# Patient Record
Sex: Female | Born: 1965
Health system: Southern US, Community
[De-identification: ages and names within clinical notes are randomized; demographics above are authoritative.]

## PROBLEM LIST (undated history)

## (undated) DIAGNOSIS — D509 Iron deficiency anemia, unspecified: Secondary | ICD-10-CM

## (undated) DIAGNOSIS — G510 Bell's palsy: Secondary | ICD-10-CM

## (undated) DIAGNOSIS — K219 Gastro-esophageal reflux disease without esophagitis: Secondary | ICD-10-CM

## (undated) DIAGNOSIS — K900 Celiac disease: Secondary | ICD-10-CM

## (undated) DIAGNOSIS — R42 Dizziness and giddiness: Secondary | ICD-10-CM

## (undated) DIAGNOSIS — I10 Essential (primary) hypertension: Secondary | ICD-10-CM

## (undated) DIAGNOSIS — L409 Psoriasis, unspecified: Secondary | ICD-10-CM

## (undated) DIAGNOSIS — D219 Benign neoplasm of connective and other soft tissue, unspecified: Secondary | ICD-10-CM

## (undated) DIAGNOSIS — Z8701 Personal history of pneumonia (recurrent): Secondary | ICD-10-CM

## (undated) DIAGNOSIS — D649 Anemia, unspecified: Secondary | ICD-10-CM

## (undated) DIAGNOSIS — H8109 Meniere's disease, unspecified ear: Secondary | ICD-10-CM

## (undated) DIAGNOSIS — N920 Excessive and frequent menstruation with regular cycle: Secondary | ICD-10-CM

## (undated) DIAGNOSIS — A64 Unspecified sexually transmitted disease: Secondary | ICD-10-CM

## (undated) HISTORY — DX: Iron deficiency anemia, unspecified: D50.9

## (undated) HISTORY — DX: Gastro-esophageal reflux disease without esophagitis: K21.9

## (undated) HISTORY — DX: Anemia, unspecified: D64.9

## (undated) HISTORY — DX: Benign neoplasm of connective and other soft tissue, unspecified: D21.9

## (undated) HISTORY — DX: Meniere's disease, unspecified ear: H81.09

## (undated) HISTORY — DX: Unspecified sexually transmitted disease: A64

## (undated) HISTORY — DX: Psoriasis, unspecified: L40.9

## (undated) HISTORY — PX: APPENDECTOMY: SHX54

## (undated) HISTORY — DX: Essential (primary) hypertension: I10

## (undated) HISTORY — DX: Excessive and frequent menstruation with regular cycle: N92.0

## (undated) HISTORY — DX: Bell's palsy: G51.0

---

## 2003-06-10 ENCOUNTER — Ambulatory Visit (HOSPITAL_COMMUNITY): Admission: RE | Admit: 2003-06-10 | Discharge: 2003-06-10 | Payer: Self-pay | Admitting: Internal Medicine

## 2004-01-19 ENCOUNTER — Ambulatory Visit (HOSPITAL_COMMUNITY): Payer: Self-pay | Admitting: Oncology

## 2004-02-29 ENCOUNTER — Encounter: Admission: RE | Admit: 2004-02-29 | Discharge: 2004-02-29 | Payer: Self-pay | Admitting: Oncology

## 2004-02-29 ENCOUNTER — Encounter (HOSPITAL_COMMUNITY): Admission: RE | Admit: 2004-02-29 | Discharge: 2004-03-30 | Payer: Self-pay | Admitting: Oncology

## 2004-05-10 ENCOUNTER — Encounter: Admission: RE | Admit: 2004-05-10 | Discharge: 2004-05-10 | Payer: Self-pay | Admitting: Oncology

## 2004-05-10 ENCOUNTER — Encounter (HOSPITAL_COMMUNITY): Admission: RE | Admit: 2004-05-10 | Discharge: 2004-06-09 | Payer: Self-pay | Admitting: Oncology

## 2004-05-10 ENCOUNTER — Ambulatory Visit (HOSPITAL_COMMUNITY): Payer: Self-pay | Admitting: Oncology

## 2004-07-05 ENCOUNTER — Encounter (HOSPITAL_COMMUNITY): Admission: RE | Admit: 2004-07-05 | Discharge: 2004-08-04 | Payer: Self-pay | Admitting: Oncology

## 2004-07-05 ENCOUNTER — Ambulatory Visit (HOSPITAL_COMMUNITY): Payer: Self-pay | Admitting: Oncology

## 2004-07-05 ENCOUNTER — Encounter: Admission: RE | Admit: 2004-07-05 | Discharge: 2004-07-05 | Payer: Self-pay | Admitting: Oncology

## 2004-09-12 ENCOUNTER — Ambulatory Visit (HOSPITAL_COMMUNITY): Payer: Self-pay | Admitting: Oncology

## 2004-09-12 ENCOUNTER — Encounter (HOSPITAL_COMMUNITY): Admission: RE | Admit: 2004-09-12 | Discharge: 2004-10-12 | Payer: Self-pay | Admitting: Oncology

## 2004-09-12 ENCOUNTER — Encounter: Admission: RE | Admit: 2004-09-12 | Discharge: 2004-09-12 | Payer: Self-pay | Admitting: Oncology

## 2004-12-07 ENCOUNTER — Ambulatory Visit (HOSPITAL_COMMUNITY): Admission: RE | Admit: 2004-12-07 | Discharge: 2004-12-07 | Payer: Self-pay | Admitting: Internal Medicine

## 2005-01-05 ENCOUNTER — Ambulatory Visit (HOSPITAL_COMMUNITY): Admission: RE | Admit: 2005-01-05 | Discharge: 2005-01-05 | Payer: Self-pay | Admitting: Internal Medicine

## 2006-03-22 ENCOUNTER — Ambulatory Visit (HOSPITAL_COMMUNITY): Admission: RE | Admit: 2006-03-22 | Discharge: 2006-03-22 | Payer: Self-pay | Admitting: Internal Medicine

## 2006-04-04 ENCOUNTER — Ambulatory Visit (HOSPITAL_COMMUNITY): Admission: RE | Admit: 2006-04-04 | Discharge: 2006-04-05 | Payer: Self-pay | Admitting: Cardiology

## 2006-05-15 ENCOUNTER — Other Ambulatory Visit: Admission: RE | Admit: 2006-05-15 | Discharge: 2006-05-15 | Payer: Self-pay | Admitting: Obstetrics and Gynecology

## 2006-11-06 ENCOUNTER — Encounter (HOSPITAL_COMMUNITY): Admission: RE | Admit: 2006-11-06 | Discharge: 2006-11-06 | Payer: Self-pay | Admitting: Oncology

## 2006-11-06 ENCOUNTER — Ambulatory Visit (HOSPITAL_COMMUNITY): Payer: Self-pay | Admitting: Oncology

## 2007-04-03 ENCOUNTER — Encounter (HOSPITAL_COMMUNITY): Admission: RE | Admit: 2007-04-03 | Discharge: 2007-05-03 | Payer: Self-pay | Admitting: Oncology

## 2007-04-03 ENCOUNTER — Ambulatory Visit (HOSPITAL_COMMUNITY): Payer: Self-pay | Admitting: Oncology

## 2007-09-04 ENCOUNTER — Ambulatory Visit (HOSPITAL_BASED_OUTPATIENT_CLINIC_OR_DEPARTMENT_OTHER): Admission: RE | Admit: 2007-09-04 | Discharge: 2007-09-04 | Payer: Self-pay | Admitting: Obstetrics and Gynecology

## 2007-09-05 ENCOUNTER — Other Ambulatory Visit: Admission: RE | Admit: 2007-09-05 | Discharge: 2007-09-05 | Payer: Self-pay | Admitting: Obstetrics and Gynecology

## 2008-10-07 ENCOUNTER — Ambulatory Visit (HOSPITAL_BASED_OUTPATIENT_CLINIC_OR_DEPARTMENT_OTHER): Admission: RE | Admit: 2008-10-07 | Discharge: 2008-10-07 | Payer: Self-pay | Admitting: Obstetrics and Gynecology

## 2008-10-07 ENCOUNTER — Ambulatory Visit: Payer: Self-pay | Admitting: Diagnostic Radiology

## 2008-10-08 ENCOUNTER — Other Ambulatory Visit: Admission: RE | Admit: 2008-10-08 | Discharge: 2008-10-08 | Payer: Self-pay | Admitting: Obstetrics and Gynecology

## 2009-09-27 ENCOUNTER — Emergency Department (HOSPITAL_BASED_OUTPATIENT_CLINIC_OR_DEPARTMENT_OTHER): Admission: EM | Admit: 2009-09-27 | Discharge: 2009-09-27 | Payer: Self-pay | Admitting: Emergency Medicine

## 2009-11-03 ENCOUNTER — Ambulatory Visit: Payer: Self-pay | Admitting: Diagnostic Radiology

## 2009-11-03 ENCOUNTER — Ambulatory Visit (HOSPITAL_BASED_OUTPATIENT_CLINIC_OR_DEPARTMENT_OTHER): Admission: RE | Admit: 2009-11-03 | Discharge: 2009-11-03 | Payer: Self-pay | Admitting: Internal Medicine

## 2009-12-29 ENCOUNTER — Emergency Department (HOSPITAL_COMMUNITY): Admission: EM | Admit: 2009-12-29 | Discharge: 2009-12-29 | Payer: Self-pay | Admitting: Emergency Medicine

## 2009-12-30 ENCOUNTER — Ambulatory Visit (HOSPITAL_BASED_OUTPATIENT_CLINIC_OR_DEPARTMENT_OTHER)
Admission: RE | Admit: 2009-12-30 | Discharge: 2009-12-30 | Payer: Self-pay | Source: Home / Self Care | Admitting: Emergency Medicine

## 2009-12-30 ENCOUNTER — Ambulatory Visit: Payer: Self-pay | Admitting: Interventional Radiology

## 2010-01-17 ENCOUNTER — Ambulatory Visit (HOSPITAL_COMMUNITY)
Admission: RE | Admit: 2010-01-17 | Discharge: 2010-01-17 | Payer: Self-pay | Source: Home / Self Care | Attending: Internal Medicine | Admitting: Internal Medicine

## 2010-02-26 ENCOUNTER — Encounter: Payer: Self-pay | Admitting: Internal Medicine

## 2010-02-27 ENCOUNTER — Encounter: Payer: Self-pay | Admitting: Internal Medicine

## 2010-06-24 NOTE — Discharge Summary (Signed)
[name], [name]              ACCOUNT NO.:  [id number]   MEDICAL RECORD NO.:  [id number]          PATIENT TYPE:  OIB   LOCATION:  2036                         FACILITY:  [facility]   PHYSICIAN:  Bryson Dames, M.D.DATE OF BIRTH:  [birth year]   DATE OF ADMISSION:  04/04/2006  DATE OF DISCHARGE:  04/05/2006                               DISCHARGE SUMMARY   DISCHARGE DIAGNOSES:  1. Prinzmetal angina-status post cath with noncritical coronary artery      disease affecting only right coronary artery, 30% __________      atherosclerotic plaques in proximal to mid portion.  Multiple areas      of intracoronary artery spasm.  2. Positive premature family history of coronary artery disease.  3. Labile hypertension.  4. History of tobacco abuse.   This is a 45 year old female patient of Dr. Melvern Banker who works as a Marine scientist  at [employer] who was admitted to [facility] with  complaints of chest pain for coronary angiography.  She was seen  initially at [facility] office by Dr. Mathis Bud approximately 4 years  ago.  At that time, she was having problems with high blood pressure.  Since then, patient was doing fine up until recently when she developed  chest pain and was scheduled for stress test in our office.  The stress  test was performed on the 29th of January of this year and revealed some  ischemia present in comparison to the previous study.  Based on this  result and her premature family history of coronary disease, patient was  scheduled for a diagnostic cath in [facility].  During that  catheterization, she was found to have 75% stenosis of the RCA and there  was fixed lesions and patient was scheduled for intervention at [facility].  She presented for intervention on April 04, 2006 and  Dr. Melvern Banker during that procedure performed harvest of the proximal mid  and right coronary artery, which did not reveal any fixed lesion, but  multiple areas  of intracoronary arteries present were infused  continuously with the catheter and reduction were registered.  Patient  only had 30% stenosis with a soft atherosclerotic heart block in the  proximal RCA and mid RCA.  Based on these findings, the decision was  made to proceed with medical therapy with calcium channel blockers and  episodic use of isosorbide mononitrate.  Avoid tobacco and start her own  statin therapy with her LDL greater than 120.   The next morning after catheterization, she was seen by Dr. Einar Gip.  Her  white blood cell count showed 4.9, hemoglobin 11.5, hematocrit 34.0,  platelet 226.  Cardiac enzymes x1 were negative.  BMP showed sodium 139,  potassium 3.6, glucose 112, BUN 9 and creatinine 0.64.  On discharge,  patient was seen by Dr. Einar Gip on the morning and felt to be stable for  discharge home.   DISCHARGE DIET:  Low-cholesterol, low-fat diet.   DISCHARGE ACTIVITIES:  She was instructed not to drive or lift weights  for 3 days post cath and may return to work on  April 09, 2006.  She is to  avoid bathing, but can shower using mild soap and not to rub puncture  site, pat it dry.  Increase activity slowly.   DISCHARGE MEDICATIONS:  1. Aspirin 325 mg daily.  2. Nexium 40 mg daily.  3. Calcium with vitamin D 600 mg daily.  4. Multivitamins daily.  5. Ambien 5 mg at night p.r.n.  6. Norvasc 5 mg daily.  7. Imdur 30 mg daily.  8. Zocor 20 mg daily.   DISCHARGE FOLLOWUP:  Patient will be seen by Dr. Mathis Bud in our office  and she would need to call and make that appointment.      York Grice, P.A.    ______________________________  Bryson Dames, M.D.    MK/MEDQ  D:  04/05/2006  T:  04/05/2006  Job:  [id number]   cc:   [facility]  Leslye Peer, MD

## 2010-06-24 NOTE — Cardiovascular Report (Signed)
[name], [name]              ACCOUNT NO.:  [id number]   MEDICAL RECORD NO.:  [id number]          PATIENT TYPE:  OIB   LOCATION:  2899                         FACILITY:  [facility]   PHYSICIAN:  Bryson Dames, M.D.DATE OF BIRTH:  1965/02/11   DATE OF PROCEDURE:  04/04/2006  DATE OF DISCHARGE:                            CARDIAC CATHETERIZATION   PROCEDURES PERFORMED:  1. Selective coronary angiography by Judkins technique.  2. Intravascular ultrasound of the proximal and mid right coronary      artery.  3. Intracoronary artery nitroglycerin and intracoronary verapamil      administration for intracoronary artery spasm.   PATIENT PROFILE:  [name] was catheterized at the [facility]  recently and had normal left anterior descending, left main and left  circumflex coronary arteries.  Her right coronary artery showed a area  of narrowing up to 75% in her proximal RCA in the upward bend of a mild  shepherd's crook configuration of the RCA.  I gave intracoronary  nitroglycerin in the cath lab there and showed no difference in the  luminal diameter and because of a recently positive stress test, we  surmise that the patient needed a stent.  We gave her Plavix and  arranged for this procedure to be done electively as an outpatient and  she was prepared with Plavix as an outpatient and then in the cath lab  was given intervenous Angiomax and ultimately begun on IV nitroglycerin.   Coronary angiography of the right coronary artery was then performed and  upon initial intubation of the right coronary artery, the catheter  aggressively deep-throated the ostium of the vessel and when I was able  to pull it back from the ostium of the vessel, I observed widespread  spasm of the proximal RCA and upgoing limb of the shepherd's crook and  also in the downgoing limb.  I gave her intracoronary nitroglycerin and  repeated the injection and there was partial relief of the spasm.  The  upgoing limb was improved, the downgoing limb was still spasmed.  I  continued to give intracoronary nitroglycerin and of course continued  the Angiomax which showed an ACT of 337 seconds.  I was able to show  virtual resolution of all but 30% area of mild narrowing.  At this  point, after reviewing the old films again, I decided to perform  intravascular ultrasound and to that end, we placed an Atlantis catheter  down the mid RCA and did two pullbacks to observe what, if any,  atherosclerotic plaque that we saw.  Over a segment of approximately 50  mm of RCA including proximal and right we saw one area of 30% eccentric  soft plaque at the takeoff of the acute marginal branch in the mid RCA  and we saw second area of 30% eccentric atherosclerotic plaquing near  the top of the shepherd's crook in her proximal RCA.  There were no  other areas of significant plaquing and the most plaquing we saw was 30%  as previously stated.   After the IVUS was performed in the wire was removed, we saw  a second  area of new spasm in the proximal portion of the distal RCA which  required both intracoronary nitroglycerin and 300 mcg of intracoronary  verapamil to treat back to normal vessel lumen diameter. TIMI-3 flow was  present throughout the vessel and no significant areas of narrowing were  noted at the conclusion of the case.  The patient tolerated procedure  fairly well.   This patient had been initially seen by [facility] and we had  planned for her to be in the PERSEUS trial, obviously that was aborted  once it was noted that the patient had only mild plaquing and a lot of  superimposed spasm in both proximal and mid RCA.   The patient was removed from the table.  Her Angiomax was stopped.  Her  sheath was left indwelling and that will be removed in the holding area.  She will be kept overnight for 23-hour observation to make sure that the  multiple areas of spasm have resolved.   FINAL  DIAGNOSES:  1. Multiple areas of intracoronary artery spasm spontaneous and      catheter induced.  2. Only 30% areas of soft atherosclerotic plaque proximal right      coronary artery and mid right coronary artery.   PLAN:  Medical therapy with calcium channel blockade, episodic use of  isosorbide mononitrate and continued abstinence from tobacco use.  The  patient will also be started on a Statin agent for her LDL of 120.           ______________________________  Bryson Dames, M.D.     WHG/MEDQ  D:  04/04/2006  T:  04/04/2006  Job:  [id number]   cc:   Lori Gilles. Gerarda Fraction, MD  Bryson Dames, M.D.  [facility]

## 2010-09-21 ENCOUNTER — Encounter (HOSPITAL_COMMUNITY): Payer: Self-pay | Admitting: Oncology

## 2010-09-21 ENCOUNTER — Encounter (HOSPITAL_COMMUNITY): Payer: 59 | Attending: Oncology | Admitting: Oncology

## 2010-09-21 VITALS — BP 178/93 | HR 59 | Temp 98.7°F | Ht 64.0 in | Wt 170.0 lb

## 2010-09-21 DIAGNOSIS — D509 Iron deficiency anemia, unspecified: Secondary | ICD-10-CM | POA: Insufficient documentation

## 2010-09-21 MED ORDER — SODIUM CHLORIDE 0.9 % IV SOLN: 1020.0000 mg | Freq: Once | INTRAVENOUS | Status: DC

## 2010-09-21 NOTE — Patient Instructions (Signed)
[facility]  Discharge Instructions  RECOMMENDATIONS MADE BY THE CONSULTANT AND ANY TEST RESULTS WILL BE SENT TO YOUR REFERRING DOCTOR.   EXAM FINDINGS BY MD TODAY AND SIGNS AND SYMPTOMS TO REPORT TO CLINIC OR PRIMARY MD:   Please do CBC/ferritin in early October and early February and send results to [phone] (fax). Return to Dr. Mariel Sleet in 1 year.   I acknowledge that I have been informed and understand all the instructions given to me and received a copy. I do not have any more questions at this time, but understand that I may call the Specialty Clinic at [facility] at [phone] during business hours should I have any further questions or need assistance in obtaining follow-up care.    __________________________________________  _____________  __________ Signature of Patient or Authorized Representative            Date                   Time    __________________________________________ Nurse's Signature

## 2010-09-21 NOTE — Progress Notes (Signed)
This office note has been dictated.

## 2010-09-22 NOTE — Progress Notes (Signed)
DIAGNOSES: 1. Iron deficiency. 2. History of iron deficiency anemia. 3. Hypercholesterolemia on therapy. 4. Excessive weight with a body mass index of 29.18.  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  She has been to see me in the past, but it has been about 4 years since she has been here.  She has had iron deficiency anemia in the past, and that was corrected only after IV iron was administered. She was never successful in absorbing iron after several different drugs.  She started craving ice about 3 or 4 months ago, and that has usually been her wake-up call for iron-deficiency again.  She had some blood work done at Dr. Sharyon Medicus office within the last week or two, but we do not have those results available at this time.  She also started feeling weak and tired a couple of months ago, a little lethargy.  She is not aware of depression that is significant by any means.  She did lose her father within the last couple of months from CHF, but that was expected, and he had been sick for about a year.  She had been helping nurse him at his [place], [place], home along with her sister and brother.  Her mother died of cancer years ago.  Her father was in his 93s.  She has one sister and one brother still alive and in good health.  Her brother has a history of testicular cancer status post surgical resection with no chemotherapy and also has a history of an MI at a very early age.  Because of that history, she was placed on cholesterol medication.  Other than that, she is not married.  She has no significant other at this time.  She is not a smoker.  She is working full time as an Charity fundraiser.  She is not aware of any nail changes, and no bowel changes.  She has even checked three Hemoccult cards which were negative for blood.  She has never had colonoscopy, of course, but  she has not turned 49 yet, and again she has not seen blood in her stool.  She has only had an occasional menses in the last 3-5 years.  She is on a type of birth control pill called Micronor by her gynecologist.  She has her GYN and breast exams by her gynecologist.  REVIEW OF SYSTEMS:  Otherwise, she is not aware of any trouble swallowing.  She does have a little bit of sensitive teeth typically to ice, but when she becomes iron deficient that seems to be overlooked and disappears, she craves the ice so much.  She has had no surgical procedures as a reason for losing blood, etc. She has not had fevers, chills or night sweats.  PHYSICAL EXAMINATION:  General Appearance:  On exam, she is in no acute distress.  She is alert.  She is oriented.  HEENT:  Facial symmetry is intact.  Nails are unremarkable.  Color of her nails is normal.  Lungs are clear to auscultation and percussion.  Heart:  Shows a regular rhythm and rate without murmur, rub or gallop.  Abdomen:  Bowel sounds are diminished.  She has no obvious hepatosplenomegaly.  She is certainly overweight for her height at this time.  She is 5 feet 4 inches.  Weight is 170 pounds.  Lymph nodes are negative throughout including the cervical, supraclavicular,  infraclavicular, axillary and inguinal area.  She has no obvious thyromegaly.  She has no peripheral edema.  Skin exam was unremarkable.  ASSESSMENT AND PLAN:  [name] most likely needs IV iron once again.  We will schedule her for Friday morning since she has it off.  We will give her Feraheme at this time 1020 mg IV for one dose and then in October and February we will get a CBC and ferritin.  She can have those drawn locally at her convenience, and then she will call me after we get the results and go over things and otherwise tentatively see her in a year.    ______________________________ Ladona Horns. Mariel Sleet, MD ESN/MEDQ  D:  09/21/2010  T:  09/22/2010  Job:  [id number]

## 2010-09-23 ENCOUNTER — Encounter (HOSPITAL_BASED_OUTPATIENT_CLINIC_OR_DEPARTMENT_OTHER): Payer: 59

## 2010-09-23 VITALS — BP 133/79 | HR 68 | Temp 98.7°F

## 2010-09-23 DIAGNOSIS — D509 Iron deficiency anemia, unspecified: Secondary | ICD-10-CM | POA: Insufficient documentation

## 2010-09-23 LAB — CBC
Hemoglobin: 13.2 g/dL (ref 12.0–15.0)
MCH: 26.7 pg (ref 26.0–34.0)
MCV: 82.4 fL (ref 78.0–100.0)
RBC: 4.94 MIL/uL (ref 3.87–5.11)
WBC: 4.6 10*3/uL (ref 4.0–10.5)

## 2010-09-23 MED ORDER — SODIUM CHLORIDE 0.9 % IJ SOLN
INTRAMUSCULAR | Status: AC
  Administered 2010-09-23: 10 mL
  Filled 2010-09-23: qty 10

## 2010-09-23 MED ORDER — SODIUM CHLORIDE 0.9 % IV SOLN
INTRAVENOUS | Status: DC
  Administered 2010-09-23: 13:00:00 via INTRAVENOUS

## 2010-09-23 MED ORDER — SODIUM CHLORIDE 0.9 % IV SOLN
1020.0000 mg | Freq: Once | INTRAVENOUS | Status: AC
  Administered 2010-09-23: 1020 mg via INTRAVENOUS
  Filled 2010-09-23: qty 34

## 2010-10-28 LAB — CBC
HCT: 41.1
MCHC: 34.2

## 2010-11-17 LAB — FERRITIN: Ferritin: 6 — ABNORMAL LOW (ref 10–291)

## 2010-11-17 LAB — CBC
HCT: 39.1
Hemoglobin: 12.7
MCHC: 32.6
MCV: 77.9 — ABNORMAL LOW
Platelets: 271
RBC: 5.01
RDW: 14.6 — ABNORMAL HIGH
WBC: 6.1

## 2010-12-08 ENCOUNTER — Telehealth (HOSPITAL_COMMUNITY): Payer: Self-pay | Admitting: *Deleted

## 2010-12-08 NOTE — Telephone Encounter (Signed)
Pt notified to repeat labs in 4-5 months--(cbc and ferritin).

## 2010-12-19 ENCOUNTER — Encounter: Payer: Self-pay | Admitting: Oncology

## 2011-08-01 ENCOUNTER — Other Ambulatory Visit (HOSPITAL_COMMUNITY): Payer: Self-pay | Admitting: Internal Medicine

## 2011-08-01 DIAGNOSIS — Z01419 Encounter for gynecological examination (general) (routine) without abnormal findings: Secondary | ICD-10-CM

## 2011-08-01 DIAGNOSIS — Z139 Encounter for screening, unspecified: Secondary | ICD-10-CM

## 2011-08-07 ENCOUNTER — Ambulatory Visit (HOSPITAL_COMMUNITY): Payer: 59

## 2011-09-20 ENCOUNTER — Ambulatory Visit (HOSPITAL_COMMUNITY): Payer: Commercial Managed Care - PPO | Admitting: Oncology

## 2011-09-20 ENCOUNTER — Ambulatory Visit (HOSPITAL_COMMUNITY): Payer: Self-pay | Admitting: Oncology

## 2011-10-25 ENCOUNTER — Ambulatory Visit (HOSPITAL_BASED_OUTPATIENT_CLINIC_OR_DEPARTMENT_OTHER)
Admission: RE | Admit: 2011-10-25 | Discharge: 2011-10-25 | Disposition: A | Discharge status: Home / Self Care | Payer: 59 | Source: Ambulatory Visit | Attending: Internal Medicine | Admitting: Internal Medicine

## 2011-10-25 ENCOUNTER — Other Ambulatory Visit (HOSPITAL_BASED_OUTPATIENT_CLINIC_OR_DEPARTMENT_OTHER): Payer: Self-pay | Admitting: Internal Medicine

## 2011-10-25 DIAGNOSIS — Z139 Encounter for screening, unspecified: Secondary | ICD-10-CM

## 2011-10-25 DIAGNOSIS — Z1231 Encounter for screening mammogram for malignant neoplasm of breast: Secondary | ICD-10-CM | POA: Insufficient documentation

## 2011-10-26 ENCOUNTER — Inpatient Hospital Stay (HOSPITAL_BASED_OUTPATIENT_CLINIC_OR_DEPARTMENT_OTHER): Admission: RE | Admit: 2011-10-26 | Payer: 59 | Source: Ambulatory Visit

## 2012-07-17 ENCOUNTER — Other Ambulatory Visit: Payer: Self-pay | Admitting: Certified Nurse Midwife

## 2012-07-17 NOTE — Telephone Encounter (Signed)
eScribe request for refill on MICRONOR Last filled - 06/15/11 X 1 YEAR Last AEX - 06/15/11 Next AEX - 08/22/12 1 pack sent to pharmacy.

## 2012-08-17 ENCOUNTER — Ambulatory Visit (INDEPENDENT_AMBULATORY_CARE_PROVIDER_SITE_OTHER): Payer: 59 | Admitting: Family Medicine

## 2012-08-17 VITALS — BP 135/84 | HR 85 | Temp 98.8°F | Resp 18 | Wt 168.0 lb

## 2012-08-17 DIAGNOSIS — J329 Chronic sinusitis, unspecified: Secondary | ICD-10-CM

## 2012-08-17 MED ORDER — FLUCONAZOLE 150 MG PO TABS: 150.0000 mg | ORAL_TABLET | Freq: Once | ORAL | Status: DC

## 2012-08-17 MED ORDER — AMOXICILLIN-POT CLAVULANATE 875-125 MG PO TABS: 1.0000 | ORAL_TABLET | Freq: Two times a day (BID) | ORAL | Status: DC

## 2012-08-17 NOTE — Progress Notes (Signed)
Patient ID: MEKIA DIPINTO MRN: [id number], DOB: April 27, 1965, 47 y.o. Date of Encounter: 08/17/2012, 1:55 PM  Primary Physician: No primary provider on file.  Chief Complaint:  Chief Complaint  Patient presents with  . URI  . Cough  . Sore Throat    HPI: 47 y.o. year old female presents with 8 day history of nasal congestion, post nasal drip, sore throat, sinus pressure, and cough. Afebrile. No chills. Nasal congestion thick and green/yellow. Sinus pressure is the worst symptom. Cough is productive secondary to post nasal drip and not associated with time of day. Ears feel full, leading to sensation of muffled hearing. Has tried OTC cold preps without success. No GI complaints.   No recent antibiotics, recent travels, or sick contacts  Quit smoking 10 years ago after mother developed lung cancer  No leg trauma, sedentary periods, h/o cancer Past Medical History  Diagnosis Date  . Hypertension     not taking meds yet  . GERD (gastroesophageal reflux disease)   . Anemia     iron def anemia  . Iron deficiency anemia      Home Meds: Prior to Admission medications   Medication Sig Start Date End Date Taking? Authorizing Provider  NORA-BE 0.35 MG tablet TAKE 1 TABLET BY MOUTH ONCE DAILY 07/17/12  Yes Verner Chol, CNM  Omega-3 Fatty Acids (FISH OIL) 1000 MG CAPS Take 1 capsule by mouth daily.     Yes Historical Provider, MD  pantoprazole (PROTONIX) 40 MG tablet Take 40 mg by mouth daily.   Yes Historical Provider, MD  zolpidem (AMBIEN) 10 MG tablet Take 10 mg by mouth at bedtime as needed.     Yes Historical Provider, MD  Calcium Carbonate-Vitamin D (CALCIUM + D) 600-200 MG-UNIT TABS Take 1 tablet by mouth 2 (two) times daily.      Historical Provider, MD  Coenzyme Q10 (CO Q 10) 10 MG CAPS Take 1 capsule by mouth daily.      Historical Provider, MD    Allergies:  Allergies  Allergen Reactions  . Erythromycin Other (See Comments)    Sensitivity/stomach upset    History    Social History  . Marital Status: Single    Spouse Name: N/A    Number of Children: N/A  . Years of Education: N/A   Occupational History  . Not on file.   Social History Main Topics  . Smoking status: Former Games developer  . Smokeless tobacco: Not on file  . Alcohol Use: 1.2 oz/week    1 Glasses of wine, 1 Cans of beer per week  . Drug Use: No  . Sexually Active: Yes    Birth Control/ Protection: Pill, Condom   Other Topics Concern  . Not on file   Social History Narrative  . No narrative on file     Review of Systems: Constitutional: negative for chills, fever, night sweats or weight changes Cardiovascular: negative for chest pain or palpitations Respiratory: negative for hemoptysis, wheezing, or shortness of breath Abdominal: negative for abdominal pain, nausea, vomiting or diarrhea Dermatological: negative for rash Neurologic: negative for headache   Physical Exam: Blood pressure 135/84, pulse 85, temperature 98.8 F (37.1 C), temperature source Oral, resp. rate 18, weight 168 lb (76.204 kg)., Body mass index is 28.82 kg/(m^2). General: Well developed, well nourished, in no acute distress. Head: Normocephalic, atraumatic, eyes without discharge, sclera non-icteric, nares are congested. Bilateral auditory canals clear, TM's are without perforation, pearly grey with reflective cone of light bilaterally.  Serous effusion bilaterally behind TM's. Maxillary sinus TTP. Oral cavity moist, dentition normal. Posterior pharynx with post nasal drip and mild erythema. No peritonsillar abscess or tonsillar exudate. Very friable, angry looking mucosa on the left nasal passage Neck: Supple. No thyromegaly. Full ROM. No lymphadenopathy. Lungs: Clear bilaterally to auscultation without wheezes, rales, or rhonchi. Breathing is unlabored.  Heart: RRR with S1 S2. No murmurs, rubs, or gallops appreciated. Msk:  Strength and tone normal for age. Extremities: No clubbing or cyanosis. No  edema. Neuro: Alert and oriented X 3. Moves all extremities spontaneously. CNII-XII grossly in tact. Psych:  Responds to questions appropriately with a normal affect.    ASSESSMENT AND PLAN:  47 y.o. year old female with sinusitis.  I am concerned with the left nasal passage and remote h/o smoking.  This needs to be rechecked next week and patient agrees to see PCP for this Sinusitis - Plan: amoxicillin-clavulanate (AUGMENTIN) 875-125 MG per tablet, fluconazole (DIFLUCAN) 150 MG tablet   -  -Tylenol/Motrin prn -Rest/fluids -RTC precautions -RTC 3-5 days if no improvement  Signed, Elvina Sidle, MD 08/17/2012 1:55 PM

## 2012-08-17 NOTE — Patient Instructions (Signed)
Have your primary care doctor recheck the left nostril to make sure it has cleared Let me know if your symptoms have not improved in 48 hours.

## 2012-08-20 ENCOUNTER — Encounter: Payer: Self-pay | Admitting: Certified Nurse Midwife

## 2012-08-22 ENCOUNTER — Ambulatory Visit (INDEPENDENT_AMBULATORY_CARE_PROVIDER_SITE_OTHER): Payer: 59 | Admitting: Certified Nurse Midwife

## 2012-08-22 ENCOUNTER — Encounter: Payer: Self-pay | Admitting: Certified Nurse Midwife

## 2012-08-22 VITALS — BP 120/70 | HR 64 | Resp 16 | Ht 64.0 in | Wt 169.0 lb

## 2012-08-22 DIAGNOSIS — Z01419 Encounter for gynecological examination (general) (routine) without abnormal findings: Secondary | ICD-10-CM

## 2012-08-22 DIAGNOSIS — Z0000 Encounter for general adult medical examination without abnormal findings: Secondary | ICD-10-CM

## 2012-08-22 MED ORDER — NORETHINDRONE 0.35 MG PO TABS: ORAL_TABLET | ORAL | Status: DC

## 2012-08-22 NOTE — Patient Instructions (Addendum)
General topics  Next pap or exam is  due in 1 year Take a Women's multivitamin Take 1200 mg. of calcium daily - prefer dietary If any concerns in interim to call back  Breast Self-Awareness Practicing breast self-awareness may pick up problems early, prevent significant medical complications, and possibly save your life. By practicing breast self-awareness, you can become familiar with how your breasts look and feel and if your breasts are changing. This allows you to notice changes early. It can also offer you some reassurance that your breast health is good. One way to learn what is normal for your breasts and whether your breasts are changing is to do a breast self-exam. If you find a lump or something that was not present in the past, it is best to contact your caregiver right away. Other findings that should be evaluated by your caregiver include nipple discharge, especially if it is bloody; skin changes or reddening; areas where the skin seems to be pulled in (retracted); or new lumps and bumps. Breast pain is seldom associated with cancer (malignancy), but should also be evaluated by a caregiver. BREAST SELF-EXAM The best time to examine your breasts is 5 7 days after your menstrual period is over.  ExitCare Patient Information 2013 [employer].   Exercise to Stay Healthy Exercise helps you become and stay healthy. EXERCISE IDEAS AND TIPS Choose exercises that:  You enjoy.  Fit into your day. You do not need to exercise really hard to be healthy. You can do exercises at a slow or medium level and stay healthy. You can:  Stretch before and after working out.  Try yoga, Pilates, or tai chi.  Lift weights.  Walk fast, swim, jog, run, climb stairs, bicycle, dance, or rollerskate.  Take aerobic classes. Exercises that burn about 150 calories:  Running 1  miles in 15 minutes.  Playing volleyball for 45 to 60 minutes.  Washing and waxing a car for 45 to 60  minutes.  Playing touch football for 45 minutes.  Walking 1  miles in 35 minutes.  Pushing a stroller 1  miles in 30 minutes.  Playing basketball for 30 minutes.  Raking leaves for 30 minutes.  Bicycling 5 miles in 30 minutes.  Walking 2 miles in 30 minutes.  Dancing for 30 minutes.  Shoveling snow for 15 minutes.  Swimming laps for 20 minutes.  Walking up stairs for 15 minutes.  Bicycling 4 miles in 15 minutes.  Gardening for 30 to 45 minutes.  Jumping rope for 15 minutes.  Washing windows or floors for 45 to 60 minutes. Document Released: 02/25/2010 Document Revised: 04/17/2011 Document Reviewed: 02/25/2010 ExitCare Patient Information 2013 [employer].   Other topics ( that may be useful information):    Sexually Transmitted Disease Sexually transmitted disease (STD) refers to any infection that is passed from person to person during sexual activity. This may happen by way of saliva, semen, blood, vaginal mucus, or urine. Common STDs include:  Gonorrhea.  Chlamydia.  Syphilis.  HIV/AIDS.  Genital herpes.  Hepatitis B and C.  Trichomonas.  Human papillomavirus (HPV).  Pubic lice. CAUSES  An STD may be spread by bacteria, virus, or parasite. A person can get an STD by:  Sexual intercourse with an infected person.  Sharing sex toys with an infected person.  Sharing needles with an infected person.  Having intimate contact with the genitals, mouth, or rectal areas of an infected person. SYMPTOMS  Some people may not have any symptoms, but   they can still pass the infection to others. Different STDs have different symptoms. Symptoms include:  Painful or bloody urination.  Pain in the pelvis, abdomen, vagina, anus, throat, or eyes.  Skin rash, itching, irritation, growths, or sores (lesions). These usually occur in the genital or anal area.  Abnormal vaginal discharge.  Penile discharge in men.  Soft, flesh-colored skin growths in the  genital or anal area.  Fever.  Pain or bleeding during sexual intercourse.  Swollen glands in the groin area.  Yellow skin and eyes (jaundice). This is seen with hepatitis. DIAGNOSIS  To make a diagnosis, your caregiver may:  Take a medical history.  Perform a physical exam.  Take a specimen (culture) to be examined.  Examine a sample of discharge under a microscope.  Perform blood test TREATMENT   Chlamydia, gonorrhea, trichomonas, and syphilis can be cured with antibiotic medicine.  Genital herpes, hepatitis, and HIV can be treated, but not cured, with prescribed medicines. The medicines will lessen the symptoms.  Genital warts from HPV can be treated with medicine or by freezing, burning (electrocautery), or surgery. Warts may come back.  HPV is a virus and cannot be cured with medicine or surgery.However, abnormal areas may be followed very closely by your caregiver and may be removed from the cervix, vagina, or vulva through office procedures or surgery. If your diagnosis is confirmed, your recent sexual partners need treatment. This is true even if they are symptom-free or have a negative culture or evaluation. They should not have sex until their caregiver says it is okay. HOME CARE INSTRUCTIONS  All sexual partners should be informed, tested, and treated for all STDs.  Take your antibiotics as directed. Finish them even if you start to feel better.  Only take over-the-counter or prescription medicines for pain, discomfort, or fever as directed by your caregiver.  Rest.  Eat a balanced diet and drink enough fluids to keep your urine clear or pale yellow.  Do not have sex until treatment is completed and you have followed up with your caregiver. STDs should be checked after treatment.  Keep all follow-up appointments, Pap tests, and blood tests as directed by your caregiver.  Only use latex condoms and water-soluble lubricants during sexual activity. Do not use  petroleum jelly or oils.  Avoid alcohol and illegal drugs.  Get vaccinated for HPV and hepatitis. If you have not received these vaccines in the past, talk to your caregiver about whether one or both might be right for you.  Avoid risky sex practices that can break the skin. The only way to avoid getting an STD is to avoid all sexual activity.Latex condoms and dental dams (for oral sex) will help lessen the risk of getting an STD, but will not completely eliminate the risk. SEEK MEDICAL CARE IF:   You have a fever.  You have any new or worsening symptoms. Document Released: 04/15/2002 Document Revised: 04/17/2011 Document Reviewed: 04/22/2010 [facility] Patient Information 2013 [employer].    Domestic Abuse You are being battered or abused if someone close to you hits, pushes, or physically hurts you in any way. You also are being abused if you are forced into activities. You are being sexually abused if you are forced to have sexual contact of any kind. You are being emotionally abused if you are made to feel worthless or if you are constantly threatened. It is important to remember that help is available. No one has the right to abuse you. PREVENTION OF FURTHER  ABUSE  Learn the warning signs of danger. This varies with situations but may include: the use of alcohol, threats, isolation from friends and family, or forced sexual contact. Leave if you feel that violence is going to occur.  If you are attacked or beaten, report it to the police so the abuse is documented. You do not have to press charges. The police can protect you while you or the attackers are leaving. Get the officer's name and badge number and a copy of the report.  Find someone you can trust and tell them what is happening to you: your caregiver, a nurse, clergy member, close friend or family member. Feeling ashamed is natural, but remember that you have done nothing wrong. No one deserves abuse. Document Released:  01/21/2000 Document Revised: 04/17/2011 Document Reviewed: 03/31/2010 ExitCare Patient Information 2013 [employer].    How Much is Too Much Alcohol? Drinking too much alcohol can cause injury, accidents, and health problems. These types of problems can include:   Car crashes.  Falls.  Family fighting (domestic violence).  Drowning.  Fights.  Injuries.  Burns.  Damage to certain organs.  Having a baby with birth defects. ONE DRINK CAN BE TOO MUCH WHEN YOU ARE:  Working.  Pregnant or breastfeeding.  Taking medicines. Ask your doctor.  Driving or planning to drive. If you or someone you know has a drinking problem, get help from a doctor.  Document Released: 11/19/2008 Document Revised: 04/17/2011 Document Reviewed: 11/19/2008 ExitCare Patient Information 2013 [employer].   Smoking Hazards Smoking cigarettes is extremely bad for your health. Tobacco smoke has over 200 known poisons in it. There are over 60 chemicals in tobacco smoke that cause cancer. Some of the chemicals found in cigarette smoke include:   Cyanide.  Benzene.  Formaldehyde.  Methanol (wood alcohol).  Acetylene (fuel used in welding torches).  Ammonia. Cigarette smoke also contains the poisonous gases nitrogen oxide and carbon monoxide.  Cigarette smokers have an increased risk of many serious medical problems and Smoking causes approximately:  90% of all lung cancer deaths in men.  80% of all lung cancer deaths in women.  90% of deaths from chronic obstructive lung disease. Compared with nonsmokers, smoking increases the risk of:  Coronary heart disease by 2 to 4 times.  Stroke by 2 to 4 times.  Men developing lung cancer by 23 times.  Women developing lung cancer by 13 times.  Dying from chronic obstructive lung diseases by 12 times.  . Smoking is the most preventable cause of death and disease in our society.  WHY IS SMOKING ADDICTIVE?  Nicotine is the chemical  agent in tobacco that is capable of causing addiction or dependence.  When you smoke and inhale, nicotine is absorbed rapidly into the bloodstream through your lungs. Nicotine absorbed through the lungs is capable of creating a powerful addiction. Both inhaled and non-inhaled nicotine may be addictive.  Addiction studies of cigarettes and spit tobacco show that addiction to nicotine occurs mainly during the teen years, when young people begin using tobacco products. WHAT ARE THE BENEFITS OF QUITTING?  There are many health benefits to quitting smoking.   Likelihood of developing cancer and heart disease decreases. Health improvements are seen almost immediately.  Blood pressure, pulse rate, and breathing patterns start returning to normal soon after quitting. QUITTING SMOKING   American Lung Association - 1-800-LUNGUSA  American Cancer Society - 1-800-ACS-2345 Document Released: 03/02/2004 Document Revised: 04/17/2011 Document Reviewed: 11/04/2008 ExitCare Patient Information 2013 ExitCare[employer]   LL[employer].   Stress Management Stress is a state of physical or mental tension that often results from changes in your life or normal routine. Some common causes of stress are:  Death of a loved one.  Injuries or severe illnesses.  Getting fired or changing jobs.  Moving into a new home. Other causes may be:  Sexual problems.  Business or financial losses.  Taking on a large debt.  Regular conflict with someone at home or at work.  Constant tiredness from lack of sleep. It is not just bad things that are stressful. It may be stressful to:  Win the lottery.  Get married.  Buy a new car. The amount of stress that can be easily tolerated varies from person to person. Changes generally cause stress, regardless of the types of change. Too much stress can affect your health. It may lead to physical or emotional problems. Too little stress (boredom) may also become stressful. SUGGESTIONS TO  REDUCE STRESS:  Talk things over with your family and friends. It often is helpful to share your concerns and worries. If you feel your problem is serious, you may want to get help from a professional counselor.  Consider your problems one at a time instead of lumping them all together. Trying to take care of everything at once may seem impossible. List all the things you need to do and then start with the most important one. Set a goal to accomplish 2 or 3 things each day. If you expect to do too many in a single day you will naturally fail, causing you to feel even more stressed.  Do not use alcohol or drugs to relieve stress. Although you may feel better for a short time, they do not remove the problems that caused the stress. They can also be habit forming.  Exercise regularly - at least 3 times per week. Physical exercise can help to relieve that "uptight" feeling and will relax you.  The shortest distance between despair and hope is often a good night's sleep.  Go to bed and get up on time allowing yourself time for appointments without being rushed.  Take a short "time-out" period from any stressful situation that occurs during the day. Close your eyes and take some deep breaths. Starting with the muscles in your face, tense them, hold it for a few seconds, then relax. Repeat this with the muscles in your neck, shoulders, hand, stomach, back and legs.  Take good care of yourself. Eat a balanced diet and get plenty of rest.  Schedule time for having fun. Take a break from your daily routine to relax. HOME CARE INSTRUCTIONS   Call if you feel overwhelmed by your problems and feel you can no longer manage them on your own.  Return immediately if you feel like hurting yourself or someone else. Document Released: 07/19/2000 Document Revised: 04/17/2011 Document Reviewed: 03/11/2007 EXERCISE AND DIET:  We recommended that you start or continue a regular exercise program for good health.  Regular exercise means any activity that makes your heart beat faster and makes you sweat.  We recommend exercising at least 30 minutes per day at least 3 days a week, preferably 4 or 5.  We also recommend a diet low in fat and sugar.  Inactivity, poor dietary choices and obesity can cause diabetes, heart attack, stroke, and kidney damage, among others.    ALCOHOL AND SMOKING:  Women should limit their alcohol intake to no more than 7 drinks/beers/glasses of wine (combined,  not each!) per week. Moderation of alcohol intake to this level decreases your risk of breast cancer and liver damage. And of course, no recreational drugs are part of a healthy lifestyle.  And absolutely no smoking or even second hand smoke. Most people know smoking can cause heart and lung diseases, but did you know it also contributes to weakening of your bones? Aging of your skin?  Yellowing of your teeth and nails?  CALCIUM AND VITAMIN D:  Adequate intake of calcium and Vitamin D are recommended.  The recommendations for exact amounts of these supplements seem to change often, but generally speaking 600 mg of calcium (either carbonate or citrate) and 800 units of Vitamin D per day seems prudent. Certain women may benefit from higher intake of Vitamin D.  If you are among these women, your doctor will have told you during your visit.    PAP SMEARS:  Pap smears, to check for cervical cancer or precancers,  have traditionally been done yearly, although recent scientific advances have shown that most women can have pap smears less often.  However, every woman still should have a physical exam from her gynecologist every year. It will include a breast check, inspection of the vulva and vagina to check for abnormal growths or skin changes, a visual exam of the cervix, and then an exam to evaluate the size and shape of the uterus and ovaries.  And after 48 years of age, a rectal exam is indicated to check for rectal cancers. We will also  provide age appropriate advice regarding health maintenance, like when you should have certain vaccines, screening for sexually transmitted diseases, bone density testing, colonoscopy, mammograms, etc.   MAMMOGRAMS:  All women over 56 years old should have a yearly mammogram. Many facilities now offer a "3D" mammogram, which may cost around $50 extra out of pocket. If possible,  we recommend you accept the option to have the 3D mammogram performed.  It both reduces the number of women who will be called back for extra views which then turn out to be normal, and it is better than the routine mammogram at detecting truly abnormal areas.    COLONOSCOPY:  Colonoscopy to screen for colon cancer is recommended for all women at age 5.  We know, you hate the idea of the prep.  We agree, BUT, having colon cancer and not knowing it is worse!!  Colon cancer so often starts as a polyp that can be seen and removed at colonscopy, which can quite literally save your life!  And if your first colonoscopy is normal and you have no family history of colon cancer, most women don't have to have it again for 10 years.  Once every ten years, you can do something that may end up saving your life, right?  We will be happy to help you get it scheduled when you are ready.  Be sure to check your insurance coverage so you understand how much it will cost.  It may be covered as a preventative service at no cost, but you should check your particular policy.

## 2012-08-22 NOTE — Progress Notes (Signed)
47 y.o. G0P0000 Single Caucasian Fe here for annual exam.  Periods none with Micronor use. Happy with choice. Desires STD screening, ended relationship.  Taking Augmentin for sinus infection.  No health issues today other than sinus infection.  Patient's last menstrual period was 05/31/2011.          Sexually active: yes  The current method of family planning is oral progesterone-only contraceptive.    Exercising: yes  cardio,weights & yoga Smoker:  no  Health Maintenance: Pap: 06-16-11 neg HPV HR neg MMG:  2013 Colonoscopy:  none BMD:   none TDaP:  2004 Labs: none Self breast exam: occ   reports that she has quit smoking. She does not have any smokeless tobacco history on file. She reports that she drinks about 4.0 ounces of alcohol per week. She reports that she does not use illicit drugs.  Past Medical History  Diagnosis Date  . Hypertension     not taking meds yet  . GERD (gastroesophageal reflux disease)   . Anemia     iron def anemia, iron infusion  . Iron deficiency anemia   . STD (sexually transmitted disease)     HSV1  . Bell's palsy     Past Surgical History  Procedure Laterality Date  . Appendectomy       47 years old    Current Outpatient Prescriptions  Medication Sig Dispense Refill  . amoxicillin-clavulanate (AUGMENTIN) 875-125 MG per tablet Take 1 tablet by mouth 2 (two) times daily.  20 tablet  0  . Calcium Carbonate-Vitamin D (CALCIUM + D) 600-200 MG-UNIT TABS Take 1 tablet by mouth as needed.       . hydrochlorothiazide (HYDRODIURIL) 25 MG tablet Take 25 mg by mouth daily.      . Multiple Vitamins-Minerals (MULTIVITAMIN PO) Take by mouth daily.      Marland Kitchen NORA-BE 0.35 MG tablet TAKE 1 TABLET BY MOUTH ONCE DAILY  28 tablet  0  . pantoprazole (PROTONIX) 40 MG tablet Take 40 mg by mouth daily.      Marland Kitchen zolpidem (AMBIEN) 10 MG tablet Take 10 mg by mouth at bedtime as needed.        . fluconazole (DIFLUCAN) 150 MG tablet Take 1 tablet (150 mg total) by mouth once.   1 tablet  0   No current facility-administered medications for this visit.    Family History  Problem Relation Age of Onset  . Cancer Mother     lung  . Cancer Brother     testicular  . Heart attack Brother   . Heart failure Father     ROS:  Pertinent items are noted in HPI.  Otherwise, a comprehensive ROS was negative.  Exam:   BP 120/70  Pulse 64  Resp 16  Ht 5\' 4"  (1.626 m)  Wt 169 lb (76.658 kg)  BMI 28.99 kg/m2  LMP 05/31/2011 Height: 5\' 4"  (162.6 cm)  Ht Readings from Last 3 Encounters:  08/22/12 5\' 4"  (1.626 m)  09/21/10 5\' 4"  (1.626 m)    General appearance: alert, cooperative and appears stated age Head: Normocephalic, without obvious abnormality, atraumatic Neck: no adenopathy, supple, symmetrical, trachea midline and thyroid normal to inspection and palpation Lungs: clear to auscultation bilaterally Breasts: normal appearance, no masses or tenderness, No nipple retraction or dimpling, No nipple discharge or bleeding, No axillary or supraclavicular adenopathy Heart: regular rate and rhythm Abdomen: soft, non-tender; no masses,  no organomegaly Extremities: extremities normal, atraumatic, no cyanosis or edema Skin: Skin color,  texture, turgor normal. No rashes or lesions Lymph nodes: Cervical, supraclavicular, and axillary nodes normal. No abnormal inguinal nodes palpated Neurologic: Grossly normal   Pelvic: External genitalia:  no lesions              Urethra:  normal appearing urethra with no masses, tenderness or lesions              Bartholin's and Skene's: normal                 Vagina: normal appearing vagina with normal color and discharge, no lesions              Cervix: normal, non tender              Pap taken: no Bimanual Exam:  Uterus:  normal size, contour, position, consistency, mobility, non-tender and anteverted              Adnexa: normal adnexa and no mass, fullness, tenderness               Rectovaginal: Confirms               Anus:   normal sphincter tone, no lesions  A:  Well Woman with normal exam  Contraception POP  STD screening  URI under treatment from urgent care P:   Reviewed health and wellness pertinent to exam  Rx Arna Medici be see order  Lab: GC,Chlamydia,HIV RPR  Pap smear as per guidelines   Mammogram yearly pap smear not taken today  counseled on breast self exam, mammography screening, adequate intake of calcium and vitamin D, diet and exercise  return annually or prn  An After Visit Summary was printed and given to the patient.   Reviewed, TL

## 2012-08-23 LAB — RPR

## 2012-08-27 LAB — IPS N GONORRHOEA AND CHLAMYDIA BY PCR

## 2012-08-30 ENCOUNTER — Telehealth: Payer: Self-pay

## 2012-08-30 NOTE — Telephone Encounter (Signed)
Message copied by Elisha Headland on Fri Aug 30, 2012  4:18 PM ------      Message from: Verner Chol      Created: Tue Aug 27, 2012  4:50 PM       Notify GC, HIV,RPR,Chlamydia negative ------

## 2012-08-30 NOTE — Telephone Encounter (Signed)
7/25 [id number]//[id number]

## 2012-09-20 NOTE — Telephone Encounter (Signed)
Patient notified of all results. 

## 2012-12-11 ENCOUNTER — Ambulatory Visit (HOSPITAL_BASED_OUTPATIENT_CLINIC_OR_DEPARTMENT_OTHER)
Admission: RE | Admit: 2012-12-11 | Discharge: 2012-12-11 | Disposition: A | Discharge status: Home / Self Care | Payer: 59 | Source: Ambulatory Visit | Attending: Internal Medicine | Admitting: Internal Medicine

## 2012-12-11 ENCOUNTER — Other Ambulatory Visit (HOSPITAL_BASED_OUTPATIENT_CLINIC_OR_DEPARTMENT_OTHER): Payer: Self-pay | Admitting: Internal Medicine

## 2012-12-11 DIAGNOSIS — Z1231 Encounter for screening mammogram for malignant neoplasm of breast: Secondary | ICD-10-CM | POA: Insufficient documentation

## 2013-03-01 ENCOUNTER — Emergency Department (HOSPITAL_BASED_OUTPATIENT_CLINIC_OR_DEPARTMENT_OTHER): Payer: 59

## 2013-03-01 ENCOUNTER — Encounter (HOSPITAL_BASED_OUTPATIENT_CLINIC_OR_DEPARTMENT_OTHER): Payer: Self-pay | Admitting: Emergency Medicine

## 2013-03-01 ENCOUNTER — Emergency Department (HOSPITAL_BASED_OUTPATIENT_CLINIC_OR_DEPARTMENT_OTHER)
Admission: EM | Admit: 2013-03-01 | Discharge: 2013-03-01 | Disposition: A | Discharge status: Home / Self Care | Payer: 59 | Attending: Emergency Medicine | Admitting: Emergency Medicine

## 2013-03-01 DIAGNOSIS — Z8619 Personal history of other infectious and parasitic diseases: Secondary | ICD-10-CM | POA: Insufficient documentation

## 2013-03-01 DIAGNOSIS — Z8742 Personal history of other diseases of the female genital tract: Secondary | ICD-10-CM | POA: Insufficient documentation

## 2013-03-01 DIAGNOSIS — K219 Gastro-esophageal reflux disease without esophagitis: Secondary | ICD-10-CM | POA: Insufficient documentation

## 2013-03-01 DIAGNOSIS — R61 Generalized hyperhidrosis: Secondary | ICD-10-CM | POA: Insufficient documentation

## 2013-03-01 DIAGNOSIS — R0602 Shortness of breath: Secondary | ICD-10-CM | POA: Insufficient documentation

## 2013-03-01 DIAGNOSIS — I10 Essential (primary) hypertension: Secondary | ICD-10-CM | POA: Insufficient documentation

## 2013-03-01 DIAGNOSIS — Z79899 Other long term (current) drug therapy: Secondary | ICD-10-CM | POA: Insufficient documentation

## 2013-03-01 DIAGNOSIS — R011 Cardiac murmur, unspecified: Secondary | ICD-10-CM | POA: Insufficient documentation

## 2013-03-01 DIAGNOSIS — M546 Pain in thoracic spine: Secondary | ICD-10-CM | POA: Insufficient documentation

## 2013-03-01 DIAGNOSIS — Z8669 Personal history of other diseases of the nervous system and sense organs: Secondary | ICD-10-CM | POA: Insufficient documentation

## 2013-03-01 DIAGNOSIS — Z862 Personal history of diseases of the blood and blood-forming organs and certain disorders involving the immune mechanism: Secondary | ICD-10-CM | POA: Insufficient documentation

## 2013-03-01 DIAGNOSIS — Z792 Long term (current) use of antibiotics: Secondary | ICD-10-CM | POA: Insufficient documentation

## 2013-03-01 DIAGNOSIS — Z87891 Personal history of nicotine dependence: Secondary | ICD-10-CM | POA: Insufficient documentation

## 2013-03-01 HISTORY — DX: Dizziness and giddiness: R42

## 2013-03-01 HISTORY — DX: Celiac disease: K90.0

## 2013-03-01 LAB — COMPREHENSIVE METABOLIC PANEL
ALT: 24 U/L (ref 0–35)
AST: 24 U/L (ref 0–37)
Albumin: 4.2 g/dL (ref 3.5–5.2)
Alkaline Phosphatase: 86 U/L (ref 39–117)
BUN: 14 mg/dL (ref 6–23)
CO2: 22 meq/L (ref 19–32)
CREATININE: 0.9 mg/dL (ref 0.50–1.10)
Calcium: 9.6 mg/dL (ref 8.4–10.5)
Chloride: 100 mEq/L (ref 96–112)
GFR, EST AFRICAN AMERICAN: 86 mL/min — AB (ref >90)
GFR, EST NON AFRICAN AMERICAN: 74 mL/min — AB (ref >90)
GLUCOSE: 98 mg/dL (ref 70–99)
Potassium: 3.5 mEq/L — ABNORMAL LOW (ref 3.7–5.3)
Sodium: 143 mEq/L (ref 137–147)
Total Bilirubin: 0.8 mg/dL (ref 0.3–1.2)
Total Protein: 7.5 g/dL (ref 6.0–8.3)

## 2013-03-01 LAB — CBC WITH DIFFERENTIAL/PLATELET
BASOS ABS: 0 10*3/uL (ref 0.0–0.1)
Basophils Relative: 0 % (ref 0–1)
Eosinophils Absolute: 0.3 10*3/uL (ref 0.0–0.7)
Eosinophils Relative: 4 % (ref 0–5)
HEMATOCRIT: 48.1 % — AB (ref 36.0–46.0)
Hemoglobin: 16 g/dL — ABNORMAL HIGH (ref 12.0–15.0)
LYMPHS ABS: 1.6 10*3/uL (ref 0.7–4.0)
LYMPHS PCT: 21 % (ref 12–46)
MCH: 29.4 pg (ref 26.0–34.0)
MCHC: 33.3 g/dL (ref 30.0–36.0)
MCV: 88.3 fL (ref 78.0–100.0)
MONO ABS: 0.7 10*3/uL (ref 0.1–1.0)
MONOS PCT: 9 % (ref 3–12)
Neutro Abs: 5.2 10*3/uL (ref 1.7–7.7)
Neutrophils Relative %: 66 % (ref 43–77)
Platelets: 217 10*3/uL (ref 150–400)
RBC: 5.45 MIL/uL — ABNORMAL HIGH (ref 3.87–5.11)
RDW: 13.8 % (ref 11.5–15.5)
WBC: 7.8 10*3/uL (ref 4.0–10.5)

## 2013-03-01 LAB — D-DIMER, QUANTITATIVE (NOT AT ARMC): D-Dimer, Quant: <0.27 ug/mL-FEU (ref 0.00–0.48)

## 2013-03-01 LAB — TROPONIN I

## 2013-03-01 MED ORDER — IOHEXOL 350 MG/ML SOLN
100.0000 mL | Freq: Once | INTRAVENOUS | Status: AC | PRN
  Administered 2013-03-01: 100 mL via INTRAVENOUS

## 2013-03-01 MED ORDER — HYDROCODONE-ACETAMINOPHEN 5-325 MG PO TABS: 1.0000 | ORAL_TABLET | Freq: Four times a day (QID) | ORAL | Status: DC | PRN

## 2013-03-01 MED ORDER — METHOCARBAMOL 500 MG PO TABS: 500.0000 mg | ORAL_TABLET | Freq: Two times a day (BID) | ORAL | Status: DC

## 2013-03-01 MED ORDER — IBUPROFEN 600 MG PO TABS: 600.0000 mg | ORAL_TABLET | Freq: Four times a day (QID) | ORAL | Status: DC | PRN

## 2013-03-01 NOTE — ED Notes (Signed)
Onset of intermittent left mid back pain one week ago.  Pain has been constant now for three days.  Reports feeling mildly SHOB, denies chest pain.  Also reports sensation of 'something squeezing her around her neck.'

## 2013-03-01 NOTE — ED Provider Notes (Signed)
CSN: [id number]     Arrival date & time 03/01/13  1541 History   First MD Initiated Contact with Patient 03/01/13 1625     Chief Complaint  Patient presents with  . Back Pain   (Consider location/radiation/quality/duration/timing/severity/associated sxs/prior Treatment) Patient is a 48 y.o. female presenting with back pain. The history is provided by the patient.  Back Pain Associated symptoms: no abdominal pain, no chest pain, no dysuria, no fever and no headaches    [name] is a 48 y.o. female who presents to the ED with pain that started in her left upper back one week ago. 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 The pain caused her to have to leave work early last week. She has taken motrin and flexeril without any change in her symptoms. She can not reproduce the pain with movement, deep breathing or palpation. Today she is concerned because she has noted that going up her steps at home she feel short of breath which is new. She denies chest pain. She did have some sweating today. She denies nausea.   Past Medical History  Diagnosis Date  . GERD (gastroesophageal reflux disease)   . Anemia     iron def anemia, iron infusion  . Iron deficiency anemia   . STD (sexually transmitted disease)     HSV1  . Bell's palsy   . Hypertension   . Fibroid   . Menorrhagia   . Vertigo   . Celiac disease    Past Surgical History  Procedure Laterality Date  . Appendectomy       48 years old   Family History  Problem Relation Age of Onset  . Cancer Mother     lung  . Cancer Brother     testicular  . Heart attack Brother   . Heart failure Father   . Stroke Sister    History  Substance Use Topics  . Smoking status: Former Research scientist (life sciences)  . Smokeless tobacco: Not on file  . Alcohol Use: 4 - 5 oz/week    8-10 drink(s) per week   OB History   Grav Para Term Preterm Abortions TAB SAB Ect Mult Living   0 0 0 0 0 0 0 0 0 0      Review of Systems  Constitutional: Negative for fever and  chills.  HENT: Negative.   Eyes: Negative for visual disturbance.  Respiratory: Positive for shortness of breath. Negative for cough, chest tightness and wheezing.   Cardiovascular: Negative for chest pain, palpitations and leg swelling.  Gastrointestinal: Negative for nausea, vomiting and abdominal pain.  Genitourinary: Negative for dysuria, urgency and frequency.  Musculoskeletal: Positive for back pain. Negative for neck pain.  Skin: Negative for rash.  Neurological: Negative for syncope and headaches.  Psychiatric/Behavioral: Negative for confusion. The patient is not nervous/anxious.     Allergies  Erythromycin  Home Medications   Current Outpatient Rx  Name  Route  Sig  Dispense  Refill  . amoxicillin-clavulanate (AUGMENTIN) 875-125 MG per tablet   Oral   Take 1 tablet by mouth 2 (two) times daily.   20 tablet   0   . Calcium Carbonate-Vitamin D (CALCIUM + D) 600-200 MG-UNIT TABS   Oral   Take 1 tablet by mouth as needed.          . fluconazole (DIFLUCAN) 150 MG tablet   Oral   Take 1 tablet (150 mg total) by mouth once.   1 tablet   0   .  hydrochlorothiazide (HYDRODIURIL) 25 MG tablet   Oral   Take 25 mg by mouth daily.         Marland Kitchen HYDROcodone-acetaminophen (NORCO) 5-325 MG per tablet   Oral   Take 1 tablet by mouth every 6 (six) hours as needed for moderate pain.   30 tablet   0   . ibuprofen (ADVIL,MOTRIN) 600 MG tablet   Oral   Take 1 tablet (600 mg total) by mouth every 6 (six) hours as needed.   30 tablet   0   . methocarbamol (ROBAXIN) 500 MG tablet   Oral   Take 1 tablet (500 mg total) by mouth 2 (two) times daily.   20 tablet   0   . Multiple Vitamins-Minerals (MULTIVITAMIN PO)   Oral   Take by mouth daily.         . norethindrone (NORA-BE) 0.35 MG tablet      TAKE 1 TABLET BY MOUTH ONCE DAILY   28 tablet   0   . pantoprazole (PROTONIX) 40 MG tablet   Oral   Take 40 mg by mouth daily.         Marland Kitchen zolpidem (AMBIEN) 10 MG  tablet   Oral   Take 10 mg by mouth at bedtime as needed.            BP 163/78  Pulse 79  Temp(Src) 98.4 F (36.9 C) (Oral)  Resp 18  Ht 5' 4"  (1.626 m)  Wt 160 lb (72.576 kg)  BMI 27.45 kg/m2  SpO2 98%  LMP 03/01/2013 Physical Exam  Nursing note and vitals reviewed. Constitutional: She is oriented to person, place, and time. She appears well-developed and well-nourished. No distress.  HENT:  Head: Atraumatic.  Eyes: Conjunctivae and EOM are normal.  Neck: Normal range of motion. Neck supple.  Cardiovascular: Normal rate and regular rhythm.   Murmur heard. Pulmonary/Chest: Effort normal. She has no wheezes. She has no rales.  Abdominal: Soft. There is no tenderness.  Musculoskeletal: Normal range of motion.       Back:  Unable to reproduce the pain the patient is having. The pain does not increase with palpation.   Neurological: She is alert and oriented to person, place, and time. She has normal strength. No cranial nerve deficit or sensory deficit. Coordination and gait normal.  Skin: Skin is warm and dry.  Psychiatric: She has a normal mood and affect. Her behavior is normal.   Results for orders placed during the hospital encounter of 03/01/13 (from the past 24 hour(s))  CBC WITH DIFFERENTIAL     Status: Abnormal   Collection Time    03/01/13  4:50 PM      Result Value Range   WBC 7.8  4.0 - 10.5 K/uL   RBC 5.45 (*) 3.87 - 5.11 MIL/uL   Hemoglobin 16.0 (*) 12.0 - 15.0 g/dL   HCT 48.1 (*) 36.0 - 46.0 %   MCV 88.3  78.0 - 100.0 fL   MCH 29.4  26.0 - 34.0 pg   MCHC 33.3  30.0 - 36.0 g/dL   RDW 13.8  11.5 - 15.5 %   Platelets 217  150 - 400 K/uL   Neutrophils Relative % 66  43 - 77 %   Neutro Abs 5.2  1.7 - 7.7 K/uL   Lymphocytes Relative 21  12 - 46 %   Lymphs Abs 1.6  0.7 - 4.0 K/uL   Monocytes Relative 9  3 - 12 %   Monocytes  Absolute 0.7  0.1 - 1.0 K/uL   Eosinophils Relative 4  0 - 5 %   Eosinophils Absolute 0.3  0.0 - 0.7 K/uL   Basophils Relative 0  0  - 1 %   Basophils Absolute 0.0  0.0 - 0.1 K/uL  COMPREHENSIVE METABOLIC PANEL     Status: Abnormal   Collection Time    03/01/13  4:50 PM      Result Value Range   Sodium 143  137 - 147 mEq/L   Potassium 3.5 (*) 3.7 - 5.3 mEq/L   Chloride 100  96 - 112 mEq/L   CO2 22  19 - 32 mEq/L   Glucose, Bld 98  70 - 99 mg/dL   BUN 14  6 - 23 mg/dL   Creatinine, Ser 0.90  0.50 - 1.10 mg/dL   Calcium 9.6  8.4 - 10.5 mg/dL   Total Protein 7.5  6.0 - 8.3 g/dL   Albumin 4.2  3.5 - 5.2 g/dL   AST 24  0 - 37 U/L   ALT 24  0 - 35 U/L   Alkaline Phosphatase 86  39 - 117 U/L   Total Bilirubin 0.8  0.3 - 1.2 mg/dL   GFR calc non Af Amer 74 (*) >90 mL/min   GFR calc Af Amer 86 (*) >90 mL/min  D-DIMER, QUANTITATIVE     Status: None   Collection Time    03/01/13  4:50 PM      Result Value Range   D-Dimer, Quant <0.27  0.00 - 0.48 ug/mL-FEU  TROPONIN I     Status: None   Collection Time    03/01/13  4:50 PM      Result Value Range   Troponin I <0.30  <0.30 ng/mL    Dg Chest 2 View  03/01/2013   CLINICAL DATA:  Left chest and back pain.  Shortness of breath.  EXAM: CHEST  2 VIEW  COMPARISON:  12/30/2009  FINDINGS: The heart size and mediastinal contours are within normal limits. Both lungs are clear. The visualized skeletal structures are unremarkable.  IMPRESSION: No active cardiopulmonary disease.   Electronically Signed   By: Earle Gell M.D.   On: 03/01/2013 17:06   Ct Angio Chest W/cm &/or Wo Cm  03/01/2013   CLINICAL DATA:  Left mid back pain 1 week.  Shortness of breath.  EXAM: CT ANGIOGRAPHY CHEST WITH CONTRAST  TECHNIQUE: Multidetector CT imaging of the chest was performed using the standard protocol during bolus administration of intravenous contrast. Multiplanar CT image reconstructions including MIPs were obtained to evaluate the vascular anatomy.  CONTRAST:  152m OMNIPAQUE IOHEXOL 350 MG/ML SOLN  COMPARISON:  12/07/2004  FINDINGS: Lungs are well inflated without consolidation or effusion. The  heart is normal in size. There is no evidence of pulmonary embolism. There is very minimal calcified plaque over the left anterior descending coronary artery. There is no hilar, mediastinal or axillary adenopathy.  Images through the upper abdomen are within normal. The remainder the exam is unremarkable.  Review of the MIP images confirms the above findings.  IMPRESSION: No acute cardiopulmonary disease. No evidence of pulmonary embolism.  Subtle age advanced atherosclerotic coronary artery disease.   Electronically Signed   By: DMarin OlpM.D.   On: 03/01/2013 18:45     Date/Time:  Saturday March 01 2013 16:46:06 EST Ventricular Rate:  66 PR Interval:  136 QRS Duration: 86 QT Interval:  412 QTC Calculation: 431 R Axis:   -32 Text  Interpretation:  Normal sinus rhythm with sinus arrhythmia Left axis deviation Abnormal ECG No previous ECGs available Confirmed by ZACKOWSKI  MD, SCOTT ([id number]) on 03/01/2013 4:55:11 PM   ED Course: Dr. Rogene Houston in to examine the patient and review EKG, labs, x-rays and CT findings.   Procedures MDM  48 y.o. female with mid left side back pain x 1 week with shortness of breath. I have reviewed this patient's vital signs, nurses notes, appropriate labs and imaging.  I have discussed findings and plan of care with the patient. She voices understanding. Stable for discharge without any immediate complications. Will treat for possible muscle strain. She will return immediately for worsening symptoms.      Medication List    TAKE these medications       HYDROcodone-acetaminophen 5-325 MG per tablet  Commonly known as:  NORCO  Take 1 tablet by mouth every 6 (six) hours as needed for moderate pain.     ibuprofen 600 MG tablet  Commonly known as:  ADVIL,MOTRIN  Take 1 tablet (600 mg total) by mouth every 6 (six) hours as needed.     methocarbamol 500 MG tablet  Commonly known as:  ROBAXIN  Take 1 tablet (500 mg total) by mouth 2 (two) times daily.      ASK  your doctor about these medications       AMBIEN 10 MG tablet  Generic drug:  zolpidem  Take 10 mg by mouth at bedtime as needed.     amoxicillin-clavulanate 875-125 MG per tablet  Commonly known as:  AUGMENTIN  Take 1 tablet by mouth 2 (two) times daily.     Calcium Carbonate-Vitamin D 600-200 MG-UNIT Tabs  Take 1 tablet by mouth as needed.     fluconazole 150 MG tablet  Commonly known as:  DIFLUCAN  Take 1 tablet (150 mg total) by mouth once.     hydrochlorothiazide 25 MG tablet  Commonly known as:  HYDRODIURIL  Take 25 mg by mouth daily.     MULTIVITAMIN PO  Take by mouth daily.     norethindrone 0.35 MG tablet  Commonly known as:  NORA-BE  TAKE 1 TABLET BY MOUTH ONCE DAILY     pantoprazole 40 MG tablet  Commonly known as:  PROTONIX  Take 40 mg by mouth daily.           [place], [place] 03/02/13 [phone]

## 2013-03-01 NOTE — Discharge Instructions (Signed)
Anzal, thank you for allowing me to take care of you today. Your labs are normal, your chest x-ray and CT scan are normal. We are treating you for muscle strain. If your symptoms worsen, return as needed.

## 2013-03-02 NOTE — ED Provider Notes (Signed)
Medical screening examination/treatment/procedure(s) were conducted as a shared visit with non-physician practitioner(s) and myself.  I personally evaluated the patient during the encounter.  EKG Interpretation    Date/Time:  Saturday March 01 2013 16:46:06 EST Ventricular Rate:  66 PR Interval:  136 QRS Duration: 86 QT Interval:  412 QTC Calculation: 431 R Axis:   -32 Text Interpretation:  Normal sinus rhythm with sinus arrhythmia Left axis deviation Abnormal ECG No previous ECGs available Confirmed by Namira Rosekrans  MD, Annisten Manchester ([id number]) on 03/01/2013 4:55:11 PM           Results for orders placed during the hospital encounter of 03/01/13  CBC WITH DIFFERENTIAL      Result Value Range   WBC 7.8  4.0 - 10.5 K/uL   RBC 5.45 (*) 3.87 - 5.11 MIL/uL   Hemoglobin 16.0 (*) 12.0 - 15.0 g/dL   HCT 48.1 (*) 36.0 - 46.0 %   MCV 88.3  78.0 - 100.0 fL   MCH 29.4  26.0 - 34.0 pg   MCHC 33.3  30.0 - 36.0 g/dL   RDW 13.8  11.5 - 15.5 %   Platelets 217  150 - 400 K/uL   Neutrophils Relative % 66  43 - 77 %   Neutro Abs 5.2  1.7 - 7.7 K/uL   Lymphocytes Relative 21  12 - 46 %   Lymphs Abs 1.6  0.7 - 4.0 K/uL   Monocytes Relative 9  3 - 12 %   Monocytes Absolute 0.7  0.1 - 1.0 K/uL   Eosinophils Relative 4  0 - 5 %   Eosinophils Absolute 0.3  0.0 - 0.7 K/uL   Basophils Relative 0  0 - 1 %   Basophils Absolute 0.0  0.0 - 0.1 K/uL  COMPREHENSIVE METABOLIC PANEL      Result Value Range   Sodium 143  137 - 147 mEq/L   Potassium 3.5 (*) 3.7 - 5.3 mEq/L   Chloride 100  96 - 112 mEq/L   CO2 22  19 - 32 mEq/L   Glucose, Bld 98  70 - 99 mg/dL   BUN 14  6 - 23 mg/dL   Creatinine, Ser 0.90  0.50 - 1.10 mg/dL   Calcium 9.6  8.4 - 10.5 mg/dL   Total Protein 7.5  6.0 - 8.3 g/dL   Albumin 4.2  3.5 - 5.2 g/dL   AST 24  0 - 37 U/L   ALT 24  0 - 35 U/L   Alkaline Phosphatase 86  39 - 117 U/L   Total Bilirubin 0.8  0.3 - 1.2 mg/dL   GFR calc non Af Amer 74 (*) >90 mL/min   GFR calc Af Amer 86 (*) >90  mL/min  D-DIMER, QUANTITATIVE      Result Value Range   D-Dimer, Quant <0.27  0.00 - 0.48 ug/mL-FEU  TROPONIN I      Result Value Range   Troponin I <0.30  <0.30 ng/mL   Dg Chest 2 View  03/01/2013   CLINICAL DATA:  Left chest and back pain.  Shortness of breath.  EXAM: CHEST  2 VIEW  COMPARISON:  12/30/2009  FINDINGS: The heart size and mediastinal contours are within normal limits. Both lungs are clear. The visualized skeletal structures are unremarkable.  IMPRESSION: No active cardiopulmonary disease.   Electronically Signed   By: Earle Gell M.D.   On: 03/01/2013 17:06   Ct Angio Chest W/cm &/or Wo Cm  03/01/2013   CLINICAL DATA:  Left mid back pain 1 week.  Shortness of breath.  EXAM: CT ANGIOGRAPHY CHEST WITH CONTRAST  TECHNIQUE: Multidetector CT imaging of the chest was performed using the standard protocol during bolus administration of intravenous contrast. Multiplanar CT image reconstructions including MIPs were obtained to evaluate the vascular anatomy.  CONTRAST:  138m OMNIPAQUE IOHEXOL 350 MG/ML SOLN  COMPARISON:  12/07/2004  FINDINGS: Lungs are well inflated without consolidation or effusion. The heart is normal in size. There is no evidence of pulmonary embolism. There is very minimal calcified plaque over the left anterior descending coronary artery. There is no hilar, mediastinal or axillary adenopathy.  Images through the upper abdomen are within normal. The remainder the exam is unremarkable.  Review of the MIP images confirms the above findings.  IMPRESSION: No acute cardiopulmonary disease. No evidence of pulmonary embolism.  Subtle age advanced atherosclerotic coronary artery disease.   Electronically Signed   By: DMarin OlpM.D.   On: 03/01/2013 18:45    Patient seen by me along with hope. 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 Patient with a concerning exertional shortness of breath extensive workup to include CT angios negative for pulmonary embolism. No evidence of  acute cardiac event. Patient will followup with her primary care Dr. in the next few days. Patient nontoxic no acute distress time of discharge.  SMervin Kung MD 03/02/13 [phone]

## 2013-07-02 ENCOUNTER — Telehealth: Payer: Self-pay | Admitting: Certified Nurse Midwife

## 2013-07-02 NOTE — Telephone Encounter (Signed)
Pt is requesting a refill for generic Micronor to be sent to [facility] in [facility]. Pt has aex scheduled for 07/302015@2 :45 with DL

## 2013-07-03 ENCOUNTER — Telehealth: Payer: Self-pay

## 2013-07-03 ENCOUNTER — Other Ambulatory Visit: Payer: Self-pay

## 2013-07-03 MED ORDER — NORETHINDRONE 0.35 MG PO TABS: ORAL_TABLET | ORAL | Status: DC

## 2013-07-03 NOTE — Telephone Encounter (Signed)
Opened in error

## 2013-07-03 NOTE — Telephone Encounter (Signed)
rx sent to pharmacy 1 pack with 1 refill

## 2013-09-04 ENCOUNTER — Ambulatory Visit (INDEPENDENT_AMBULATORY_CARE_PROVIDER_SITE_OTHER): Payer: 59 | Admitting: Certified Nurse Midwife

## 2013-09-04 ENCOUNTER — Encounter: Payer: Self-pay | Admitting: Certified Nurse Midwife

## 2013-09-04 VITALS — BP 120/84 | HR 70 | Resp 16 | Ht 63.75 in | Wt 167.0 lb

## 2013-09-04 DIAGNOSIS — K900 Celiac disease: Secondary | ICD-10-CM | POA: Insufficient documentation

## 2013-09-04 DIAGNOSIS — Z01419 Encounter for gynecological examination (general) (routine) without abnormal findings: Secondary | ICD-10-CM

## 2013-09-04 DIAGNOSIS — Z124 Encounter for screening for malignant neoplasm of cervix: Secondary | ICD-10-CM

## 2013-09-04 DIAGNOSIS — Z309 Encounter for contraceptive management, unspecified: Secondary | ICD-10-CM

## 2013-09-04 MED ORDER — NORETHINDRONE 0.35 MG PO TABS: ORAL_TABLET | ORAL | Status: DC

## 2013-09-04 NOTE — Progress Notes (Signed)
Reviewed personally.  M. Suzanne Madinah Quarry, MD.  

## 2013-09-04 NOTE — Progress Notes (Signed)
48 y.o. G0P0000 Single Caucasian Fe here for annual exam. Periods none on Micronor, working well. Not sexually active.Has not noted symptoms from fibroids. Diagnosed with Celiac disease, diet working well to control pain episodes with. Sees PCP for aex, labs and medcation management. Occasional hot flash, no night sweats. No other health issues.  Patient's last menstrual period was 05/31/2011.          Sexually active: No.  The current method of family planning is abstinence.    Exercising: Yes.    cardio,weights,yoga Smoker:  no  Health Maintenance: Pap: 06-16-11 neg HPV HR neg MMG:  12-11-12 density category b, birads category 1:neg Colonoscopy: 2014 BMD:   none TDaP:  2 014 Labs: none Self breast exam: done occ   reports that she has quit smoking. She does not have any smokeless tobacco history on file. She reports that she drinks about 4 - 6 ounces of alcohol per week. She reports that she does not use illicit drugs.  Past Medical History  Diagnosis Date  . GERD (gastroesophageal reflux disease)   . Anemia     iron def anemia, iron infusion  . Iron deficiency anemia   . STD (sexually transmitted disease)     HSV1  . Bell's palsy   . Hypertension   . Fibroid   . Menorrhagia   . Vertigo   . Celiac disease     Past Surgical History  Procedure Laterality Date  . Appendectomy       48 years old    Current Outpatient Prescriptions  Medication Sig Dispense Refill  . Calcium Carbonate-Vitamin D (CALCIUM + D) 600-200 MG-UNIT TABS Take 1 tablet by mouth as needed.       . Coenzyme Q10 (CO Q 10 PO) Take by mouth daily.      . hydrochlorothiazide (HYDRODIURIL) 25 MG tablet Take 25 mg by mouth daily.      Marland Kitchen ibuprofen (ADVIL,MOTRIN) 600 MG tablet Take 1 tablet (600 mg total) by mouth every 6 (six) hours as needed.  30 tablet  0  . KRILL OIL PO Take by mouth daily.      . Multiple Vitamins-Minerals (MULTIVITAMIN PO) Take by mouth daily.      . norethindrone (NORA-BE) 0.35 MG tablet  TAKE 1 TABLET BY MOUTH ONCE DAILY  28 tablet  1  . pantoprazole (PROTONIX) 40 MG tablet Take 40 mg by mouth daily.       No current facility-administered medications for this visit.    Family History  Problem Relation Age of Onset  . Cancer Mother     lung  . Cancer Brother     testicular  . Heart attack Brother   . Heart failure Father   . Stroke Sister     ROS:  Pertinent items are noted in HPI.  Otherwise, a comprehensive ROS was negative.  Exam:   BP 120/84  Pulse 70  Resp 16  Ht 5' 3.75" (1.619 m)  Wt 167 lb (75.751 kg)  BMI 28.90 kg/m2  LMP 05/31/2011 Height: 5' 3.75" (161.9 cm)  Ht Readings from Last 3 Encounters:  09/04/13 5' 3.75" (1.619 m)  03/01/13 5' 4"  (1.626 m)  08/22/12 5' 4"  (1.626 m)    General appearance: alert, cooperative and appears stated age Head: Normocephalic, without obvious abnormality, atraumatic Neck: no adenopathy, supple, symmetrical, trachea midline and thyroid normal to inspection and palpation and non-palpable Lungs: clear to auscultation bilaterally Breasts: normal appearance, no masses or tenderness, No nipple retraction  or dimpling, No nipple discharge or bleeding, No axillary or supraclavicular adenopathy Heart: regular rate and rhythm Abdomen: soft, non-tender; no masses,  no organomegaly Extremities: extremities normal, atraumatic, no cyanosis or edema Skin: Skin color, texture, turgor normal. No rashes or lesions Lymph nodes: Cervical, supraclavicular, and axillary nodes normal. No abnormal inguinal nodes palpated Neurologic: Grossly normal   Pelvic: External genitalia:  no lesions              Urethra:  normal appearing urethra with no masses, tenderness or lesions              Bartholin's and Skene's: normal                 Vagina: normal appearing vagina with normal color and discharge, no lesions              Cervix: normal, non tender              Pap taken: Yes.   Bimanual Exam:  Uterus:  enlarged, 12-14 weeks size  and known enlargement, due history of fibroids, no size chagne from previous exam              Adnexa: normal adnexa and no mass, fullness, tenderness               Rectovaginal: Confirms               Anus:  normal sphincter tone, no lesions  A:  Well Woman with normal exam  Contraception POP ( to control menorrhagia with fibroid)  P:   Reviewed health and wellness pertinent to exam  Rx Alinda Sierras Be see order  Pap smear taken today with HPVHR  Discussed perimenopause and etiology and expectations.   counseled on breast self exam, mammography screening, adequate intake of calcium and vitamin D, diet and exercise  return annually or prn  An After Visit Summary was printed and given to the patient.

## 2013-09-04 NOTE — Patient Instructions (Signed)

## 2013-09-08 LAB — IPS PAP TEST WITH REFLEX TO HPV

## 2013-11-10 ENCOUNTER — Emergency Department
Admission: EM | Admit: 2013-11-10 | Discharge: 2013-11-10 | Disposition: A | Discharge status: Home / Self Care | Payer: 59 | Source: Home / Self Care | Attending: Emergency Medicine | Admitting: Emergency Medicine

## 2013-11-10 ENCOUNTER — Encounter: Payer: Self-pay | Admitting: Emergency Medicine

## 2013-11-10 ENCOUNTER — Emergency Department (INDEPENDENT_AMBULATORY_CARE_PROVIDER_SITE_OTHER): Payer: 59

## 2013-11-10 DIAGNOSIS — J209 Acute bronchitis, unspecified: Secondary | ICD-10-CM

## 2013-11-10 DIAGNOSIS — R0989 Other specified symptoms and signs involving the circulatory and respiratory systems: Secondary | ICD-10-CM

## 2013-11-10 DIAGNOSIS — J0100 Acute maxillary sinusitis, unspecified: Secondary | ICD-10-CM

## 2013-11-10 DIAGNOSIS — R05 Cough: Secondary | ICD-10-CM

## 2013-11-10 DIAGNOSIS — R062 Wheezing: Secondary | ICD-10-CM

## 2013-11-10 HISTORY — DX: Personal history of pneumonia (recurrent): Z87.01

## 2013-11-10 MED ORDER — PROMETHAZINE-CODEINE 6.25-10 MG/5ML PO SYRP: ORAL_SOLUTION | ORAL | Status: DC

## 2013-11-10 MED ORDER — CEFTRIAXONE SODIUM 1 G IJ SOLR
1.0000 g | INTRAMUSCULAR | Status: AC
  Administered 2013-11-10: 1 g via INTRAMUSCULAR

## 2013-11-10 MED ORDER — ALBUTEROL SULFATE HFA 108 (90 BASE) MCG/ACT IN AERS: 2.0000 | INHALATION_SPRAY | RESPIRATORY_TRACT | Status: DC | PRN

## 2013-11-10 MED ORDER — AZITHROMYCIN 250 MG PO TABS: ORAL_TABLET | ORAL | Status: DC

## 2013-11-10 MED ORDER — PREDNISONE (PAK) 10 MG PO TABS: ORAL_TABLET | ORAL | Status: DC

## 2013-11-10 NOTE — ED Provider Notes (Signed)
CSN: [id number]     Arrival date & time 11/10/13  1313 History   First MD Initiated Contact with Patient 11/10/13 1317     Chief Complaint  Patient presents with  . Nasal Congestion  . Cough    Patient is a 48 y.o. female presenting with cough. The history is provided by the patient.  Cough URI HISTORY  [name] is a 48 y.o. female who complains of progressively worsening sinus and chest congestion X 8 days. Has tried OTC meds, such as Sudafed and ibuprofen and steroid nasal spray with minimal improvement. No chills/sweats +  Fever  +  Nasal congestion +  Discolored yellow, slightly bloody Post-nasal drainage and rhinorrhea Mild sinus pain/pressure Mild sore throat, moderate hoarseness  +  Cough, occasional discolored sputum + wheezing with exertion or when she lies down at night Positive chest congestion No hemoptysis Mild shortness of breath/wheezing with exertion. No exertional chest pain No pleuritic pain  No itchy/red eyes No earache  No nausea No vomiting No abdominal pain No diarrhea  No skin rashes +  Fatigue No myalgias No headache   Past medical history of pneumonia 2012. She quit smoking about 15 years ago. Denies history of chronic lung disease Past Medical History  Diagnosis Date  . GERD (gastroesophageal reflux disease)   . Anemia     iron def anemia, iron infusion  . Iron deficiency anemia   . STD (sexually transmitted disease)     HSV1  . Bell's palsy   . Hypertension   . Fibroid   . Menorrhagia   . Vertigo   . Celiac disease   . History of pneumonia    Past Surgical History  Procedure Laterality Date  . Appendectomy       48 years old   Family History  Problem Relation Age of Onset  . Cancer Mother     lung  . Cancer Brother     testicular  . Heart attack Brother   . Heart failure Father   . Stroke Sister    History  Substance Use Topics  . Smoking status: Former Research scientist (life sciences)  . Smokeless tobacco: Not on file  . Alcohol Use: 4.0 -  6.0 oz/week    8-12 drink(s) per week   OB History   Grav Para Term Preterm Abortions TAB SAB Ect Mult Living   0 0 0 0 0 0 0 0 0 0      Review of Systems  Respiratory: Positive for cough.   All other systems reviewed and are negative.   Allergies  Augmentin and Erythromycin I carefully reviewed drug allergies with her. Emycin caused nausea in the past, but she is taken Zithromax in the past without any problems. Augmentin caused diarrhea in the past but denies any penicillin or cephalosporin allergy. Home Medications   Prior to Admission medications   Medication Sig Start Date End Date Taking? Authorizing Provider  Calcium Carbonate-Vitamin D (CALCIUM + D) 600-200 MG-UNIT TABS Take 1 tablet by mouth as needed.    Yes Historical Provider, MD  Coenzyme Q10 (CO Q 10 PO) Take by mouth daily.   Yes Historical Provider, MD  hydrochlorothiazide (HYDRODIURIL) 25 MG tablet Take 25 mg by mouth daily.   Yes Historical Provider, MD  ibuprofen (ADVIL,MOTRIN) 600 MG tablet Take 1 tablet (600 mg total) by mouth every 6 (six) hours as needed. 03/01/13  Yes Hope Bunnie Pion, NP  KRILL OIL PO Take by mouth daily.   Yes Historical Provider, MD  losartan (COZAAR) 25 MG tablet Take 25 mg by mouth daily.   Yes Historical Provider, MD  Multiple Vitamins-Minerals (MULTIVITAMIN PO) Take by mouth daily.   Yes Historical Provider, MD  norethindrone (NORA-BE) 0.35 MG tablet TAKE 1 TABLET BY MOUTH ONCE DAILY 09/04/13  Yes Regina Eck, CNM  pantoprazole (PROTONIX) 40 MG tablet Take 40 mg by mouth daily.   Yes Historical Provider, MD  albuterol (PROVENTIL HFA;VENTOLIN HFA) 108 (90 BASE) MCG/ACT inhaler Inhale 2 puffs into the lungs every 4 (four) hours as needed for wheezing or shortness of breath. 11/10/13   Jacqulyn Cane, MD  azithromycin (ZITHROMAX Z-PAK) 250 MG tablet Take 2 tablets on day one, then 1 tablet daily on days 2 through 5 11/10/13   Jacqulyn Cane, MD  predniSONE (STERAPRED UNI-PAK) 10 MG tablet Take as  directed for 6 days.--Take 6 on day 1, 5 on day 2, 4 on day 3, then 3 tablets on day 4, then 2 tablets on day 5, then 1 on day 6. 11/10/13   Jacqulyn Cane, MD  promethazine-codeine [facility] WITH CODEINE) 6.25-10 MG/5ML syrup Take 1-2 teaspoons every 4-6 hours as needed for cough. May cause drowsiness. 11/10/13   Jacqulyn Cane, MD   BP 171/86  Pulse 81  Temp(Src) 98.5 F (36.9 C) (Oral)  Resp 16  Ht 5\' 4"  (1.626 m)  Wt 174 lb (78.926 kg)  BMI 29.85 kg/m2  SpO2 97% Physical Exam  Nursing note and vitals reviewed. Constitutional: She is oriented to person, place, and time. She appears well-developed and well-nourished. No distress.  Appears very fatigued, ill appearing but nontoxic appearing. She is alert and cooperative and ambulatory. She is hoarse  HENT:  Head: Normocephalic and atraumatic.  Right Ear: Tympanic membrane, external ear and ear canal normal.  Left Ear: Tympanic membrane, external ear and ear canal normal.  Nose: Mucosal edema and rhinorrhea present. Right sinus exhibits maxillary sinus tenderness. Left sinus exhibits maxillary sinus tenderness.  Mouth/Throat: Oropharynx is clear and moist. No oral lesions. No oropharyngeal exudate.  Eyes: Right eye exhibits no discharge. Left eye exhibits no discharge. No scleral icterus.  Neck: Neck supple.  Cardiovascular: Normal rate, regular rhythm and normal heart sounds.   Pulmonary/Chest: Effort normal. No respiratory distress. She has no decreased breath sounds. She has no wheezes. She has rhonchi (diffusely). She has rales.  Diffuse anterior posterior rhonchi. No definite wheezes. Faint bibasilar crackles left greater than right  Lymphadenopathy:    She has no cervical adenopathy.  Neurological: She is alert and oriented to person, place, and time.  Skin: Skin is warm and dry.  Psychiatric: She has a normal mood and affect.   Oxygen saturation 97% on room air ED Course  Procedures (including critical care time) Labs  Review Labs Reviewed - No data to display  Imaging Review Dg Chest 2 View  11/10/2013   CLINICAL DATA:  Cough, chest congestion and wheezing.  Ex-smoker.  EXAM: CHEST  2 VIEW  COMPARISON:  Chest radiographs date 03/01/2013 and chest CTA dated 03/01/2013.  FINDINGS: Normal sized heart. Clear lungs. Minimal diffuse peribronchial thickening. Thoracic spine degenerative changes.  IMPRESSION: Stable minimal bronchitic changes.  No acute abnormality.   Electronically Signed   By: Enrique Sack M.D.   On: 11/10/2013 14:30     MDM   1. Acute bronchitis, unspecified organism   2. Acute maxillary sinusitis, recurrence not specified    chest x-ray shows minimal bronchitic changes, but no definite infiltrates .--I gave her a printed  copy of the chest x-ray report. Clinically, she has mild rales both bases, so she may have a very mild walking pneumonia that is not showing up on chest x-ray. Vital signs stable with normal pulse ox.--We repeated BP: 150/90  Treatment options discussed, as well as risks, benefits, alternatives. Patient voiced understanding and agreement with the following plans: Zithromax Z-PAK. One refill--erx Albuterol HFA in case she gets wheezing, although no wheezing on exam now.--erx Rocephin 1 g IM stat Gave her the following printed prescriptions in case she needs to fill these: Phenergan with codeine cough syrup, 1 or 2 teaspoons at bedtime. Prednisone 10 mg-6 day Dosepak, fill if not improving in a couple days or if wheezing persists. Other symptomatic care discussed.--She prefers to use Delsym cough syrup OTC for daytime ,prn. May continue steroid nasal spray. Followup with PCP within the next few weeks to recheck BP. Avoid Sudafed if BP stays elevated.  Follow-up with your primary care doctor in 5-7 days if not improving, or sooner if symptoms become worse. Precautions discussed. Red flags discussed.--ER if any red flags. Questions invited and answered. Patient voiced  understanding and agreement.      Jacqulyn Cane, MD 11/10/13 [phone]

## 2013-11-10 NOTE — ED Notes (Signed)
Pt c/o yellow nasal congestion x 7 days, with productive cough, laryngitis x 2 days. She has taken Sudafed and IBF.

## 2013-12-03 ENCOUNTER — Other Ambulatory Visit (HOSPITAL_BASED_OUTPATIENT_CLINIC_OR_DEPARTMENT_OTHER): Payer: Self-pay | Admitting: Internal Medicine

## 2013-12-03 DIAGNOSIS — Z1231 Encounter for screening mammogram for malignant neoplasm of breast: Secondary | ICD-10-CM

## 2013-12-05 ENCOUNTER — Inpatient Hospital Stay (HOSPITAL_BASED_OUTPATIENT_CLINIC_OR_DEPARTMENT_OTHER): Admission: RE | Admit: 2013-12-05 | Payer: 59 | Source: Ambulatory Visit

## 2014-09-07 DIAGNOSIS — L409 Psoriasis, unspecified: Secondary | ICD-10-CM | POA: Insufficient documentation

## 2014-10-01 ENCOUNTER — Encounter: Payer: Self-pay | Admitting: Certified Nurse Midwife

## 2014-10-01 ENCOUNTER — Ambulatory Visit (INDEPENDENT_AMBULATORY_CARE_PROVIDER_SITE_OTHER): Payer: 59 | Admitting: Certified Nurse Midwife

## 2014-10-01 VITALS — BP 110/72 | HR 70 | Resp 20 | Ht 64.25 in | Wt 172.0 lb

## 2014-10-01 DIAGNOSIS — Z01419 Encounter for gynecological examination (general) (routine) without abnormal findings: Secondary | ICD-10-CM

## 2014-10-01 DIAGNOSIS — N951 Menopausal and female climacteric states: Secondary | ICD-10-CM | POA: Diagnosis not present

## 2014-10-01 DIAGNOSIS — Z3041 Encounter for surveillance of contraceptive pills: Secondary | ICD-10-CM

## 2014-10-01 MED ORDER — NORETHINDRONE 0.35 MG PO TABS: ORAL_TABLET | ORAL | Status: DC

## 2014-10-01 NOTE — Progress Notes (Signed)
49 y.o. G0P0000 Single  Caucasian Fe here for annual exam. Periods none, with POP. Complaining of hot flashes, no night sweats. No vaginal dryness. Vic Ripper PCP for aex/labs/ hypertension medication. New diagnosis of psoriasis working with Dermatology regarding. Still struggling with diet for Celiac disease. No other health issues today.  No LMP recorded. Patient is not currently having periods (Reason: Oral contraceptives).          Sexually active: No.  The current method of family planning is oral progesterone-only contraceptive.    Exercising: Yes.    cardio,weights & yoga Smoker:  no  Health Maintenance: Pap: 09-04-13 neg HPV reflex MMG: 12-11-12 category b density,birads 1:neg Colonoscopy: 2014 negative, celiac diagnosed BMD:   None  TDaP:  2014 Labs: none Self breast exam: done occ   reports that she has quit smoking. She does not have any smokeless tobacco history on file. She reports that she drinks about 4.8 oz of alcohol per week. She reports that she does not use illicit drugs.  Past Medical History  Diagnosis Date  . GERD (gastroesophageal reflux disease)   . Anemia     iron def anemia, iron infusion  . Iron deficiency anemia   . STD (sexually transmitted disease)     HSV1  . Bell's palsy   . Hypertension   . Fibroid   . Menorrhagia   . Vertigo   . Celiac disease   . History of pneumonia   . Psoriasis     Past Surgical History  Procedure Laterality Date  . Appendectomy       49 years old    Current Outpatient Prescriptions  Medication Sig Dispense Refill  . Calcium Carbonate-Vitamin D (CALCIUM + D) 600-200 MG-UNIT TABS Take 1 tablet by mouth as needed.     . Coenzyme Q10 (CO Q 10 PO) Take by mouth daily.    . hydrochlorothiazide (HYDRODIURIL) 25 MG tablet Take 25 mg by mouth daily.    Marland Kitchen KRILL OIL PO Take by mouth daily.    Marland Kitchen losartan (COZAAR) 25 MG tablet Take 25 mg by mouth daily.    . Multiple Vitamins-Minerals (MULTIVITAMIN PO) Take by mouth daily.     . norethindrone (NORA-BE) 0.35 MG tablet TAKE 1 TABLET BY MOUTH ONCE DAILY 3 Package 4  . pantoprazole (PROTONIX) 40 MG tablet Take 40 mg by mouth daily.     No current facility-administered medications for this visit.    Family History  Problem Relation Age of Onset  . Cancer Mother     lung  . Cancer Brother     testicular  . Heart attack Brother   . Heart failure Father   . Stroke Sister     ROS:  Pertinent items are noted in HPI.  Otherwise, a comprehensive ROS was negative.  Exam:   BP 110/72 mmHg  Pulse 70  Resp 20  Ht 5' 4.25" (1.632 m)  Wt 172 lb (78.019 kg)  BMI 29.29 kg/m2 Height: 5' 4.25" (163.2 cm) Ht Readings from Last 3 Encounters:  10/01/14 5' 4.25" (1.632 m)  11/10/13 5\' 4"  (1.626 m)  09/04/13 5' 3.75" (1.619 m)    General appearance: alert, cooperative and appears stated age Head: Normocephalic, without obvious abnormality, atraumatic Neck: no adenopathy, supple, symmetrical, trachea midline and thyroid normal to inspection and palpation Lungs: clear to auscultation bilaterally Breasts: normal appearance, no masses or tenderness, No nipple retraction or dimpling, No nipple discharge or bleeding, No axillary or supraclavicular adenopathy Heart: regular rate  and rhythm Abdomen: soft, non-tender; no masses,  no organomegaly Extremities: extremities normal, atraumatic, no cyanosis or edema Skin: Skin color, texture, turgor normal. No rashes or lesions Lymph nodes: Cervical, supraclavicular, and axillary nodes normal. No abnormal inguinal nodes palpated Neurologic: Grossly normal   Pelvic: External genitalia:  no lesions              Urethra:  normal appearing urethra with no masses, tenderness or lesions              Bartholin's and Skene's: normal                 Vagina: normal appearing vagina with normal color and discharge, no lesions              Cervix: normal,non tender,no lesions              Pap taken: No. Bimanual Exam:  Uterus:  normal  size, contour, position, consistency, mobility, non-tender              Adnexa: normal adnexa and no mass, fullness, tenderness               Rectovaginal: Confirms               Anus:  normal sphincter tone, no lesions  Chaperone present: yes  A:  Well Woman with normal exam  Perimenopausal ? On POP for irregular cycles with amenorrhea now  Hypertension management with PCP  Psoriasis management with dermatology  Mammogram due    P:   Reviewed health and wellness pertinent to exam.   Discussed etiology and cycle expectation and symptoms which can occur. Recommend FSH due amenorrhea with POP. Patient agreeable.  Rx Alinda Sierras Be see order  Lab: [facility]  Continue MD visits as indicated.  Discussed importance of mammogram and given # to schedule.  Pap smear as above    Reviewed health and wellness issues regarding diet and exercise, SBE, calcium and Vitamin D.   return annually or prn  An After Visit Summary was printed and given to the patient.

## 2014-10-01 NOTE — Patient Instructions (Signed)

## 2014-10-02 LAB — FOLLICLE STIMULATING HORMONE: FSH: 82.1 m[IU]/mL

## 2014-10-02 NOTE — Progress Notes (Signed)
Reviewed personally.  M. Suzanne Monnie Anspach, MD.  

## 2014-10-06 ENCOUNTER — Telehealth: Payer: Self-pay

## 2014-10-06 NOTE — Telephone Encounter (Signed)
[email or web address]

## 2014-10-06 NOTE — Telephone Encounter (Signed)
-----   Message from Regina Eck, CNM sent at 10/06/2014 12:29 PM EDT ----- Notify patient that her [facility] shows menopausal range, but is still having periods which is normal. Continue with progesterone contraception which is keeping the endometrial lining WNL during perimenopausal time. Re-evaluate at aex unless changes.

## 2014-10-06 NOTE — Telephone Encounter (Signed)
Patient notified see result note 

## 2014-11-09 ENCOUNTER — Other Ambulatory Visit (HOSPITAL_BASED_OUTPATIENT_CLINIC_OR_DEPARTMENT_OTHER): Payer: Self-pay | Admitting: Internal Medicine

## 2014-11-09 DIAGNOSIS — Z1239 Encounter for other screening for malignant neoplasm of breast: Secondary | ICD-10-CM

## 2014-11-13 ENCOUNTER — Ambulatory Visit (HOSPITAL_BASED_OUTPATIENT_CLINIC_OR_DEPARTMENT_OTHER): Payer: 59

## 2014-11-23 ENCOUNTER — Ambulatory Visit (HOSPITAL_BASED_OUTPATIENT_CLINIC_OR_DEPARTMENT_OTHER)
Admission: RE | Admit: 2014-11-23 | Discharge: 2014-11-23 | Disposition: A | Discharge status: Home / Self Care | Payer: 59 | Source: Ambulatory Visit | Attending: Internal Medicine | Admitting: Internal Medicine

## 2014-11-23 DIAGNOSIS — Z1239 Encounter for other screening for malignant neoplasm of breast: Secondary | ICD-10-CM

## 2014-11-23 DIAGNOSIS — Z1231 Encounter for screening mammogram for malignant neoplasm of breast: Secondary | ICD-10-CM | POA: Diagnosis not present

## 2015-02-03 ENCOUNTER — Emergency Department
Admission: EM | Admit: 2015-02-03 | Discharge: 2015-02-03 | Disposition: A | Discharge status: Home / Self Care | Payer: 59 | Source: Home / Self Care | Attending: Emergency Medicine | Admitting: Emergency Medicine

## 2015-02-03 ENCOUNTER — Emergency Department (INDEPENDENT_AMBULATORY_CARE_PROVIDER_SITE_OTHER): Payer: 59

## 2015-02-03 ENCOUNTER — Encounter: Payer: Self-pay | Admitting: *Deleted

## 2015-02-03 DIAGNOSIS — R05 Cough: Secondary | ICD-10-CM

## 2015-02-03 DIAGNOSIS — S9032XA Contusion of left foot, initial encounter: Secondary | ICD-10-CM | POA: Diagnosis not present

## 2015-02-03 DIAGNOSIS — J209 Acute bronchitis, unspecified: Secondary | ICD-10-CM

## 2015-02-03 DIAGNOSIS — M79672 Pain in left foot: Secondary | ICD-10-CM

## 2015-02-03 DIAGNOSIS — R058 Other specified cough: Secondary | ICD-10-CM

## 2015-02-03 MED ORDER — PROMETHAZINE-CODEINE 6.25-10 MG/5ML PO SYRP: ORAL_SOLUTION | ORAL | Status: DC

## 2015-02-03 MED ORDER — PREDNISONE 10 MG (21) PO TBPK: ORAL_TABLET | ORAL | Status: DC

## 2015-02-03 MED ORDER — ALBUTEROL SULFATE HFA 108 (90 BASE) MCG/ACT IN AERS: 2.0000 | INHALATION_SPRAY | RESPIRATORY_TRACT | Status: DC | PRN

## 2015-02-03 MED ORDER — AZITHROMYCIN 250 MG PO TABS: ORAL_TABLET | ORAL | Status: DC

## 2015-02-03 NOTE — ED Notes (Signed)
Pt c/o 2-4 days of cough, congestion and sinus problems. Taken Sudafed, Mucinex, Delsym and IBF otc. Afebrile. Also has a complaint of left foot pain,post fall getting out of the shower yesterday. She has a h/o broken great toe on same foot.

## 2015-02-03 NOTE — ED Provider Notes (Signed)
CSN: [id number]     Arrival date & time 02/03/15  1349 History   First MD Initiated Contact with Patient 02/03/15 1404     Chief Complaint  Patient presents with  . Cough  . Foot Injury   (Consider location/radiation/quality/duration/timing/severity/associated sxs/prior Treatment) HPI URI HISTORY  [name] is a 49 y.o. female who complains of onset of cough and cold symptoms for 4 days.  Progressively worsening. Pt c/o 4 days of cough, congestion and sinus problems. Taken Sudafed, Mucinex, Delsym and IBF otc.   Chief complaint #2: Also has a complaint of left foot pain,post fall getting out of the shower yesterday. She has a h/o broken great toe on same foot.  No chills/sweats No documented  Fever  +  Nasal congestion +  Discolored Post-nasal drainage No sinus pain/pressure No sore throat  +  Cough, productive of tan sputum + wheezing + chest congestion No hemoptysis No shortness of breath No pleuritic pain  No itchy/red eyes No earache  No nausea No vomiting No abdominal pain No diarrhea  No skin rashes +  Fatigue No myalgias No headache   Past Medical History  Diagnosis Date  . GERD (gastroesophageal reflux disease)   . Anemia     iron def anemia, iron infusion  . Iron deficiency anemia   . STD (sexually transmitted disease)     HSV1  . Bell's palsy   . Hypertension   . Fibroid   . Menorrhagia   . Vertigo   . Celiac disease   . History of pneumonia   . Psoriasis    Past Surgical History  Procedure Laterality Date  . Appendectomy       49 years old   Family History  Problem Relation Age of Onset  . Cancer Mother     lung  . Cancer Brother     testicular  . Heart attack Brother   . Heart failure Father   . Stroke Sister    Social History  Substance Use Topics  . Smoking status: Former Research scientist (life sciences)  . Smokeless tobacco: None  . Alcohol Use: 4.8 oz/week    8 Standard drinks or equivalent per week   OB History    Gravida Para Term Preterm AB  TAB SAB Ectopic Multiple Living   0 0 0 0 0 0 0 0 0 0      Review of Systems  All other systems reviewed and are negative.   Allergies  Augmentin and Erythromycin  Home Medications   Prior to Admission medications   Medication Sig Start Date End Date Taking? Authorizing Provider  Apremilast (OTEZLA) 30 MG TABS Take by mouth.   Yes Historical Provider, MD  albuterol (PROVENTIL HFA;VENTOLIN HFA) 108 (90 Base) MCG/ACT inhaler Inhale 2 puffs into the lungs every 4 (four) hours as needed for wheezing or shortness of breath. 02/03/15   Jacqulyn Cane, MD  azithromycin (ZITHROMAX Z-PAK) 250 MG tablet Take 2 tablets on day one, then 1 tablet daily on days 2 through 5 02/03/15   Jacqulyn Cane, MD  Calcium Carbonate-Vitamin D (CALCIUM + D) 600-200 MG-UNIT TABS Take 1 tablet by mouth as needed.     Historical Provider, MD  Coenzyme Q10 (CO Q 10 PO) Take by mouth daily.    Historical Provider, MD  hydrochlorothiazide (HYDRODIURIL) 25 MG tablet Take 25 mg by mouth daily.    Historical Provider, MD  KRILL OIL PO Take by mouth daily.    Historical Provider, MD  losartan (COZAAR) 25 MG tablet  Take 25 mg by mouth daily.    Historical Provider, MD  Multiple Vitamins-Minerals (MULTIVITAMIN PO) Take by mouth daily.    Historical Provider, MD  norethindrone (NORA-BE) 0.35 MG tablet TAKE 1 TABLET BY MOUTH ONCE DAILY 10/01/14   Regina Eck, CNM  pantoprazole (PROTONIX) 40 MG tablet Take 40 mg by mouth daily.    Historical Provider, MD  predniSONE (STERAPRED UNI-PAK 21 TAB) 10 MG (21) TBPK tablet Take as directed for 6 days.--Take 6 on day 1, 5 on day 2, 4 on day 3, then 3 tablets on day 4, then 2 tablets on day 5, then 1 on day 6. 02/03/15   Jacqulyn Cane, MD  promethazine-codeine [facility] WITH CODEINE) 6.25-10 MG/5ML syrup Take 1-2 teaspoons every 4-6 hours as needed for cough. May cause drowsiness. 02/03/15   Jacqulyn Cane, MD   Meds Ordered and Administered this Visit  Medications - No data to  display  BP 147/85 mmHg  Pulse 89  Temp(Src) 98.1 F (36.7 C) (Oral)  Resp 16  Wt 172 lb (78.019 kg)  SpO2 96% No data found.   Physical Exam  Constitutional: She is oriented to person, place, and time. She appears well-developed and well-nourished. No distress.  HENT:  Head: Normocephalic and atraumatic.  Right Ear: Tympanic membrane, external ear and ear canal normal.  Left Ear: Tympanic membrane, external ear and ear canal normal.  Nose: Mucosal edema and rhinorrhea present. Right sinus exhibits maxillary sinus tenderness. Left sinus exhibits maxillary sinus tenderness.  Mouth/Throat: Oropharynx is clear and moist. No oral lesions. No oropharyngeal exudate.  Eyes: Right eye exhibits no discharge. Left eye exhibits no discharge. No scleral icterus.  Neck: Neck supple.  Cardiovascular: Normal rate, regular rhythm and normal heart sounds.   Pulmonary/Chest: Effort normal. She has wheezes. She has rhonchi. She has rales (Very faint bibasilar rales bilaterally).  Musculoskeletal:  Left foot: Tender, mildly swollen left forefoot especially left great toe, and first metatarsal area. Mild decreased range of motion. Also decreased range of motion of first IPJ, which she states is chronic since she had a prior first toe fracture years ago. Neurovascular distally intact  Lymphadenopathy:    She has no cervical adenopathy.  Neurological: She is alert and oriented to person, place, and time.  Skin: Skin is warm and dry.  Psychiatric: She has a normal mood and affect.  Nursing note and vitals reviewed.   ED Course  Procedures (including critical care time)  Labs Review Labs Reviewed - No data to display  Imaging Review Dg Chest 2 View  02/03/2015  CLINICAL DATA:  Cough and fever for 5 days EXAM: CHEST  2 VIEW COMPARISON:  November 10, 2013 FINDINGS: There is mild scarring in the right base. There is no edema or consolidation. The heart size and pulmonary vascularity are normal. No  adenopathy. No bone lesions. IMPRESSION: Mild scarring right base.  No edema or consolidation. Electronically Signed   By: Lowella Grip III M.D.   On: 02/03/2015 14:41   Dg Foot Complete Left  02/03/2015  CLINICAL DATA:  Slipped getting out of shower last night, LEFT great toe pain and LEFT forefoot pain EXAM: LEFT FOOT - COMPLETE 3+ VIEW COMPARISON:  None FINDINGS: Question osseous demineralization. Joint spaces preserved. No acute fracture, dislocation, or bone destruction. Small Achilles insertion calcaneal spur. Question soft tissue swelling at the medial margin of the IP joint great toe. IMPRESSION: No acute osseous abnormalities. Electronically Signed   By: Crist Infante.D.  On: 02/03/2015 14:42      MDM   1. Acute bronchitis with bronchospasm   2. Productive cough   3. Contusion, foot, left, initial encounter     Treatment options discussed, as well as risks, benefits, alternatives. Further foot contusion, she declined postop shoe or other wrap or brace. Other symptomatic care discussed. Tylenol or ibuprofen when necessary pain. She declined prescription pain medication. Patient voiced understanding and agreement with the following plans: Zithromax Z-PAK.  Albuterol HFA in case she gets wheezing.  (She declined Rocephin shot) Phenergan with codeine cough syrup, 1 or 2 teaspoons at bedtime. Prednisone 10 mg-6 day Dosepak. Other symptomatic care discussed.--She prefers to use Delsym cough syrup OTC for daytime ,prn. May continue steroid nasal spray. Such as Flonase. Followup with PCP within the next few weeks to recheck BP.  Avoid Sudafed if BP stays elevated. Red flags discussed. Precautions discussed.   Jacqulyn Cane, MD 02/03/15 [phone]

## 2015-02-22 MED FILL — OTEZLA 30 MG TABS: 30 | 30 days supply | Qty: 60 | Fill #0

## 2015-02-24 MED FILL — NORETHINDRONE 0.35 MG TAB: 0.35 | 84 days supply | Qty: 84 | Fill #1

## 2015-03-01 MED FILL — LOSARTAN POTASSIUM 25 MG TA: 25 | 90 days supply | Qty: 90 | Fill #1

## 2015-03-31 MED FILL — OTEZLA 30 MG TABS: 30 | 30 days supply | Qty: 60 | Fill #1

## 2015-04-21 MED FILL — PANTOPRAZOLE SOD DR 40 MG T: 40 | 90 days supply | Qty: 90 | Fill #1

## 2015-04-29 ENCOUNTER — Encounter: Payer: Self-pay | Admitting: *Deleted

## 2015-04-29 ENCOUNTER — Emergency Department
Admission: EM | Admit: 2015-04-29 | Discharge: 2015-04-29 | Disposition: A | Discharge status: Home / Self Care | Payer: 59 | Source: Home / Self Care | Attending: Family Medicine | Admitting: Family Medicine

## 2015-04-29 ENCOUNTER — Emergency Department (INDEPENDENT_AMBULATORY_CARE_PROVIDER_SITE_OTHER): Payer: 59

## 2015-04-29 DIAGNOSIS — J209 Acute bronchitis, unspecified: Secondary | ICD-10-CM | POA: Diagnosis not present

## 2015-04-29 DIAGNOSIS — R0989 Other specified symptoms and signs involving the circulatory and respiratory systems: Secondary | ICD-10-CM

## 2015-04-29 DIAGNOSIS — R05 Cough: Secondary | ICD-10-CM

## 2015-04-29 DIAGNOSIS — R509 Fever, unspecified: Secondary | ICD-10-CM

## 2015-04-29 MED ORDER — PREDNISONE 20 MG PO TABS: ORAL_TABLET | ORAL | Status: DC

## 2015-04-29 MED ORDER — DOXYCYCLINE HYCLATE 100 MG PO CAPS: 100.0000 mg | ORAL_CAPSULE | Freq: Two times a day (BID) | ORAL | Status: DC

## 2015-04-29 MED ORDER — GUAIFENESIN-CODEINE 100-10 MG/5ML PO SOLN: ORAL | Status: DC

## 2015-04-29 MED ORDER — ALBUTEROL SULFATE HFA 108 (90 BASE) MCG/ACT IN AERS: 2.0000 | INHALATION_SPRAY | RESPIRATORY_TRACT | Status: DC | PRN

## 2015-04-29 MED FILL — VENTOLIN HFA 90 MCG INHALER: 108 (90 BAS | 17 days supply | Qty: 18 | Fill #0

## 2015-04-29 MED FILL — GUAIATUSSIN AC LIQUID: 100-10 | 12 days supply | Qty: 120 | Fill #0

## 2015-04-29 MED FILL — predniSONE 20 MG TABS: 20 | 8 days supply | Qty: 13 | Fill #0

## 2015-04-29 MED FILL — DOXYCYCLINE HYC 100 MG CAP: 100 | 10 days supply | Qty: 20 | Fill #0

## 2015-04-29 NOTE — ED Provider Notes (Signed)
CSN: [id number]     Arrival date & time 04/29/15  1237 History   First MD Initiated Contact with Patient 04/29/15 1302     Chief Complaint  Patient presents with  . Cough      HPI Comments: Four days ago patient developed a non-productive cough, followed by sinus congestion the next day.  Yesterday she developed fever to 100.9 and myalgias.  She has had a mild sore throat, and has developed wheezing.  She notes that an albuterol inhaler helps. She had pneumonia about 3 years ago, and feels like she did at that time.    The history is provided by the patient.    Past Medical History  Diagnosis Date  . GERD (gastroesophageal reflux disease)   . Anemia     iron def anemia, iron infusion  . Iron deficiency anemia   . STD (sexually transmitted disease)     HSV1  . Bell's palsy   . Hypertension   . Fibroid   . Menorrhagia   . Vertigo   . Celiac disease   . History of pneumonia   . Psoriasis    Past Surgical History  Procedure Laterality Date  . Appendectomy       50 years old   Family History  Problem Relation Age of Onset  . Cancer Mother     lung  . Cancer Brother     testicular  . Heart attack Brother   . Heart failure Father   . Stroke Sister    Social History  Substance Use Topics  . Smoking status: Former Research scientist (life sciences)  . Smokeless tobacco: None  . Alcohol Use: 4.8 oz/week    8 Standard drinks or equivalent per week   OB History    Gravida Para Term Preterm AB TAB SAB Ectopic Multiple Living   0 0 0 0 0 0 0 0 0 0      Review of Systems + sore throat + cough No pleuritic pain + wheezing + nasal congestion + post-nasal drainage No sinus pain/pressure No itchy/red eyes No earache No hemoptysis No SOB + fever, + chills No nausea No vomiting No abdominal pain No diarrhea No urinary symptoms No skin rash + fatigue No myalgias No headache Used OTC meds without relief  Allergies  Augmentin and Erythromycin  Home Medications   Prior to Admission  medications   Medication Sig Start Date End Date Taking? Authorizing Provider  albuterol (PROVENTIL HFA;VENTOLIN HFA) 108 (90 Base) MCG/ACT inhaler Inhale 2 puffs into the lungs every 4 (four) hours as needed for wheezing or shortness of breath. 04/29/15   Kandra Nicolas, MD  Apremilast (OTEZLA) 30 MG TABS Take by mouth.    Historical Provider, MD  Calcium Carbonate-Vitamin D (CALCIUM + D) 600-200 MG-UNIT TABS Take 1 tablet by mouth as needed.     Historical Provider, MD  Coenzyme Q10 (CO Q 10 PO) Take by mouth daily.    Historical Provider, MD  doxycycline (VIBRAMYCIN) 100 MG capsule Take 1 capsule (100 mg total) by mouth 2 (two) times daily. Take with food. 04/29/15   Kandra Nicolas, MD  guaiFENesin-codeine 100-10 MG/5ML syrup Take 29mL by mouth at bedtime as needed for cough 04/29/15   Kandra Nicolas, MD  hydrochlorothiazide (HYDRODIURIL) 25 MG tablet Take 25 mg by mouth daily.    Historical Provider, MD  KRILL OIL PO Take by mouth daily.    Historical Provider, MD  losartan (COZAAR) 25 MG tablet Take 25 mg by mouth  daily.    Historical Provider, MD  Multiple Vitamins-Minerals (MULTIVITAMIN PO) Take by mouth daily.    Historical Provider, MD  norethindrone (NORA-BE) 0.35 MG tablet TAKE 1 TABLET BY MOUTH ONCE DAILY 10/01/14   Regina Eck, CNM  pantoprazole (PROTONIX) 40 MG tablet Take 40 mg by mouth daily.    Historical Provider, MD  predniSONE (DELTASONE) 20 MG tablet Take one tab by mouth twice daily for 5 days, then one daily for 3 days. Take with food. 04/29/15   Kandra Nicolas, MD   Meds Ordered and Administered this Visit  Medications - No data to display  BP 133/85 mmHg  Pulse 91  Temp(Src) 99.7 F (37.6 C) (Oral)  Resp 18  Ht 5\' 4"  (1.626 m)  Wt 173 lb (78.472 kg)  BMI 29.68 kg/m2  SpO2 96% No data found.   Physical Exam Nursing notes and Vital Signs reviewed. Appearance:  Patient appears stated age, and in no acute distress Eyes:  Pupils are equal, round, and  reactive to light and accomodation.  Extraocular movement is intact.  Conjunctivae are not inflamed  Ears:  Canals normal.  Tympanic membranes normal.  Nose:  Congested turbinates.  No sinus tenderness.   Pharynx: Uvula slightly edematous Neck:  Supple.  Tender enlarged posterior/lateral nodes are palpated bilaterally  Lungs:  Clear to auscultation.  Breath sounds are equal.  Moving air well. Heart:  Regular rate and rhythm without murmurs, rubs, or gallops.  Abdomen:  Nontender without masses or hepatosplenomegaly.  Bowel sounds are present.  No CVA or flank tenderness.  Extremities:  No edema.  Skin:  No rash present.   ED Course  Procedures none   Imaging Review Dg Chest 2 View  04/29/2015  CLINICAL DATA:  Cough, fever and congestion for 4 days. EXAM: CHEST  2 VIEW COMPARISON:  02/03/2015 FINDINGS: The cardiac silhouette, mediastinal and hilar contours are within normal limits and stable. The lungs are clear. Stable prominent epicardial fat on the right side. No pleural effusion. The bony thorax is intact. IMPRESSION: No acute cardiopulmonary findings. No change since prior examinations. Electronically Signed   By: Marijo Sanes M.D.   On: 04/29/2015 13:36      MDM   1. Acute bronchitis, unspecified organism; suspect bronchospasm    Begin doxycycline for atypical coverage.  Begin prednisone burst. Rx for Robitussin AC for night time cough.  Take plain guaifenesin (1200mg  extended release tabs such as Mucinex) twice daily, with plenty of water, for cough and congestion.  May add Pseudoephedrine (30mg , one or two every 4 to 6 hours) for sinus congestion.  Get adequate rest.   May use Afrin nasal spray (or generic oxymetazoline) twice daily for about 5 days and then discontinue.  Also recommend using saline nasal spray several times daily and saline nasal irrigation (AYR is a common brand).  Use Flonase nasal spray each morning after using Afrin nasal spray and saline nasal  irrigation. Try warm salt water gargles for sore throat.  Stop all antihistamines for now, and other non-prescription cough/cold preparations.   Follow-up with family doctor if not improving about one week.    Kandra Nicolas, MD 05/06/15 1325

## 2015-04-29 NOTE — Discharge Instructions (Signed)

## 2015-04-29 NOTE — ED Notes (Signed)
Pt c/o nonproductive cough that began on 04/25/15. She now c/o productive cough and fever 100.3-101.4 since yesterday. She took IBF at 12N after a temp of 100.9.

## 2015-05-04 MED FILL — OTEZLA 30 MG TABS: 30 | 30 days supply | Qty: 60 | Fill #2

## 2015-05-04 MED FILL — HYDROCHLOROTHIAZIDE 25 MG T: 25 | 90 days supply | Qty: 90 | Fill #2

## 2015-05-14 DIAGNOSIS — L814 Other melanin hyperpigmentation: Secondary | ICD-10-CM | POA: Diagnosis not present

## 2015-05-14 DIAGNOSIS — D225 Melanocytic nevi of trunk: Secondary | ICD-10-CM | POA: Diagnosis not present

## 2015-05-14 DIAGNOSIS — I788 Other diseases of capillaries: Secondary | ICD-10-CM | POA: Diagnosis not present

## 2015-05-14 DIAGNOSIS — D2239 Melanocytic nevi of other parts of face: Secondary | ICD-10-CM | POA: Diagnosis not present

## 2015-05-14 DIAGNOSIS — D2261 Melanocytic nevi of right upper limb, including shoulder: Secondary | ICD-10-CM | POA: Diagnosis not present

## 2015-05-14 DIAGNOSIS — D2262 Melanocytic nevi of left upper limb, including shoulder: Secondary | ICD-10-CM | POA: Diagnosis not present

## 2015-05-14 DIAGNOSIS — L400 Psoriasis vulgaris: Secondary | ICD-10-CM | POA: Diagnosis not present

## 2015-05-14 DIAGNOSIS — L564 Polymorphous light eruption: Secondary | ICD-10-CM | POA: Diagnosis not present

## 2015-05-14 DIAGNOSIS — L821 Other seborrheic keratosis: Secondary | ICD-10-CM | POA: Diagnosis not present

## 2015-05-19 MED FILL — NORETHINDRONE 0.35 MG TAB: 0.35 | 84 days supply | Qty: 84 | Fill #2

## 2015-05-25 MED FILL — LOSARTAN POTASSIUM 25 MG TA: 25 | 90 days supply | Qty: 90 | Fill #2

## 2015-06-01 MED FILL — OTEZLA 30 MG TABS: 30 | 30 days supply | Qty: 60 | Fill #0

## 2015-06-30 MED FILL — OTEZLA 30 MG TABS: 30 | 30 days supply | Qty: 60 | Fill #1

## 2015-07-19 MED FILL — PANTOPRAZOLE SOD DR 40 MG T: 40 | 90 days supply | Qty: 90 | Fill #2

## 2015-07-30 MED FILL — OTEZLA 30 MG TABS: 30 | 30 days supply | Qty: 60 | Fill #2

## 2015-07-30 MED FILL — HYDROCHLOROTHIAZIDE 25 MG T: 25 | 90 days supply | Qty: 90 | Fill #3

## 2015-08-13 MED FILL — NORETHINDRONE 0.35 MG TAB: 0.35 | 84 days supply | Qty: 84 | Fill #3

## 2015-08-24 MED FILL — LOSARTAN POTASSIUM 25 MG TA: 25 | 90 days supply | Qty: 90 | Fill #3

## 2015-09-06 MED FILL — OTEZLA 30 MG TABS: 30 | 30 days supply | Qty: 60 | Fill #3

## 2015-10-05 MED FILL — OTEZLA 30 MG TABS: 30 | 30 days supply | Qty: 60 | Fill #4

## 2015-10-06 ENCOUNTER — Telehealth: Payer: Self-pay | Admitting: Pharmacist

## 2015-10-06 NOTE — Telephone Encounter (Signed)
Called patient to schedule an appointment for the [facility]. I was unable to reach the patient so I left a HIPAA-compliant message requesting that the patient return my call.

## 2015-10-14 ENCOUNTER — Ambulatory Visit: Payer: 59 | Attending: Internal Medicine | Admitting: Pharmacist

## 2015-10-14 DIAGNOSIS — L409 Psoriasis, unspecified: Secondary | ICD-10-CM

## 2015-10-14 MED ORDER — APREMILAST 30 MG PO TABS: 1.0000 | ORAL_TABLET | Freq: Two times a day (BID) | ORAL | 0 refills | Status: DC

## 2015-10-14 NOTE — Progress Notes (Signed)
   S: Patient presents today to the [facility].  Patient is currently taking Otezla for psoriasis. Patient is managed by Dr. Ubaldo Glassing for this.   Adherence: denies any missed doses  Dose: 30 mg PO BID  Efficacy: reports improved control of psoriasis. She still has some patches on her hands, but overall, she is very happy with the improvement.   Monitoring: - GI Upset: had some when she started the [place] last fall but it has resolved. - Headache: denies - Weight loss: denies - S/sx of infection: denies   O:     Lab Results  Component Value Date   WBC 7.8 03/01/2013   HGB 16.0 (H) 03/01/2013   HCT 48.1 (H) 03/01/2013   MCV 88.3 03/01/2013   PLT 217 03/01/2013      Chemistry      Component Value Date/Time   NA 143 03/01/2013 1650   K 3.5 (L) 03/01/2013 1650   CL 100 03/01/2013 1650   CO2 22 03/01/2013 1650   BUN 14 03/01/2013 1650   CREATININE 0.90 03/01/2013 1650      Component Value Date/Time   CALCIUM 9.6 03/01/2013 1650   ALKPHOS 86 03/01/2013 1650   AST 24 03/01/2013 1650   ALT 24 03/01/2013 1650   BILITOT 0.8 03/01/2013 1650       A/P: 1. Medication review: patient currently on Otezla for psoriasis and is tolerating it well with no adverse effects and improved control of her psoriasis. Reviewed the medication with her, including how the medication works, possible adverse effects, and the need for regular follow up with her dermatologist. No recommendations for any changes.    Nicoletta Ba, PharmD, BCPS, BCACP, [facility] [phone]

## 2015-10-14 NOTE — Patient Instructions (Signed)
Mechanism of Action  Apremilast inhibits phosphodiesterase 4 (PDE4) specific for cyclic adenosine monophosphate (cAMP) which results in increased intracellular cAMP levels and regulation of numerous inflammatory mediators (eg, decreased expression of nitric oxide synthase, TNF-?, and interleukin [IL]-23, as well as increased IL-10) (Schafer, 2012).   Apremilast oral tablets What is this medicine? APREMILAST (a PRE mil ast) is used to treat plaque psoriasis and psoriatic arthritis. This medicine may be used for other purposes; ask your health care provider or pharmacist if you have questions. What should I tell my health care provider before I take this medicine? They need to know if you have any of these conditions: -kidney disease -mental illness -an unusual or allergic reaction to apremilast, other medicines, foods, dyes, or preservatives -pregnant or trying to get pregnant -breast-feeding How should I use this medicine? Take this medicine by mouth with a glass of water. Follow the directions on the prescription label. Do not cut, crush or chew this medicine. You can take it with or without food. If it upsets your stomach, take it with food. Take your medicine at regular intervals. Do not take it more often than directed. Do not stop taking except on your doctor's advice. Talk to your pediatrician regarding the use of this medicine in children. Special care may be needed. Overdosage: If you think you have taken too much of this medicine contact a poison control center or emergency room at once. NOTE: This medicine is only for you. Do not share this medicine with others. What if I miss a dose? If you miss a dose, take it as soon as you can. If it is almost time for your next dose, take only that dose. Do not take double or extra doses. What may interact with this medicine? This medicine may interact with the following medications: -certain medicines for seizures like carbamazepine,  phenobarbital, phenytoin -rifampin This list may not describe all possible interactions. Give your health care provider a list of all the medicines, herbs, non-prescription drugs, or dietary supplements you use. Also tell them if you smoke, drink alcohol, or use illegal drugs. Some items may interact with your medicine. What should I watch for while using this medicine? Tell your doctor or healthcare professional if your symptoms do not start to get better or if they get worse. Patients and their families should watch out for new or worsening depression or thoughts of suicide. Also watch out for sudden changes in feelings such as feeling anxious, agitated, panicky, irritable, hostile, aggressive, impulsive, severely restless, overly excited and hyperactive, or not being able to sleep. If this happens, call your health care professional. What side effects may I notice from receiving this medicine? Side effects that you should report to your doctor or health care professional as soon as possible: -depressed mood -weight loss Side effects that usually do not require medical attention (report these to your doctor or health care professional if they continue or are bothersome.): -diarrhea -headache -nausea, vomiting This list may not describe all possible side effects. Call your doctor for medical advice about side effects. You may report side effects to FDA at 1-800-FDA-1088. Where should I keep my medicine? Keep out of the reach of children. Store below 30 degrees C (86 degrees F). Throw away any unused medicine after the expiration date. NOTE: This sheet is a summary. It may not cover all possible information. If you have questions about this medicine, talk to your doctor, pharmacist, or health care provider.    2016,  Elsevier/Gold Standard. (2012-10-30 14:50:47)

## 2015-10-18 MED FILL — PANTOPRAZOLE SOD DR 40 MG T: 40 | 90 days supply | Qty: 90 | Fill #0

## 2015-10-22 MED FILL — LOSARTAN POTASSIUM 25 MG TA: 25 | 30 days supply | Qty: 30 | Fill #0

## 2015-10-22 MED FILL — HYDROCHLOROTHIAZIDE 25 MG T: 25 | 30 days supply | Qty: 30 | Fill #0

## 2015-11-02 ENCOUNTER — Encounter: Payer: Self-pay | Admitting: Certified Nurse Midwife

## 2015-11-02 ENCOUNTER — Other Ambulatory Visit: Payer: Self-pay | Admitting: Certified Nurse Midwife

## 2015-11-02 ENCOUNTER — Ambulatory Visit: Payer: 59 | Admitting: Certified Nurse Midwife

## 2015-11-02 VITALS — BP 110/78 | HR 72 | Resp 16 | Ht 64.25 in | Wt 175.0 lb

## 2015-11-02 DIAGNOSIS — N951 Menopausal and female climacteric states: Secondary | ICD-10-CM | POA: Diagnosis not present

## 2015-11-02 DIAGNOSIS — Z01419 Encounter for gynecological examination (general) (routine) without abnormal findings: Secondary | ICD-10-CM | POA: Diagnosis not present

## 2015-11-02 DIAGNOSIS — Z0000 Encounter for general adult medical examination without abnormal findings: Secondary | ICD-10-CM | POA: Diagnosis not present

## 2015-11-02 DIAGNOSIS — H52223 Regular astigmatism, bilateral: Secondary | ICD-10-CM | POA: Diagnosis not present

## 2015-11-02 DIAGNOSIS — Z124 Encounter for screening for malignant neoplasm of cervix: Secondary | ICD-10-CM

## 2015-11-02 DIAGNOSIS — H2513 Age-related nuclear cataract, bilateral: Secondary | ICD-10-CM | POA: Diagnosis not present

## 2015-11-02 DIAGNOSIS — H524 Presbyopia: Secondary | ICD-10-CM | POA: Diagnosis not present

## 2015-11-02 DIAGNOSIS — H5213 Myopia, bilateral: Secondary | ICD-10-CM | POA: Diagnosis not present

## 2015-11-02 LAB — POCT URINALYSIS DIPSTICK
BILIRUBIN UA: NEGATIVE
GLUCOSE UA: NEGATIVE
Ketones, UA: NEGATIVE
Leukocytes, UA: NEGATIVE
NITRITE UA: NEGATIVE
PH UA: 6
Protein, UA: NEGATIVE
RBC UA: NEGATIVE
UROBILINOGEN UA: NEGATIVE

## 2015-11-02 NOTE — Patient Instructions (Signed)

## 2015-11-02 NOTE — Progress Notes (Signed)
50 y.o. G0P0000 Single  Caucasian Fe here for annual exam. Periods none with POP. Still having hot flashes, occasional.. Some night sweats,  Some vaginal dryness, using KY jelly. Celiac under control with GI. Psoriasis of hands has been worse over the past few months. Seeing Dermatology for management with medication. Sees PCP for labs and hypertension management. Has had partner change and would like STD screening. No other health issues today. Spent most of the summer by the pool when not working!!   No LMP recorded. Patient is not currently having periods (Reason: Oral contraceptives).          Sexually active: Yes.    The current method of family planning is oral progesterone-only contraceptive.    Exercising: Yes.    cardio & weights Smoker:  no  Health Maintenance: Pap:  09-04-13 neg HPV HR neg MMG:  11-23-14 Category b density birads 1:neg Colonoscopy: 2014 neg, celiac diagnosed BMD:   none TDaP:  2017 Shingles: no Pneumonia: no Hep C and HIV: HIV neg 2014 Labs: poct urine-neg Self breast exam: done occ   reports that she has quit smoking. She has never used smokeless tobacco. She reports that she drinks about 3.6 - 4.8 oz of alcohol per week . She reports that she does not use drugs.  Past Medical History:  Diagnosis Date  . Anemia    iron def anemia, iron infusion  . Bell's palsy   . Celiac disease   . Fibroid   . GERD (gastroesophageal reflux disease)   . History of pneumonia   . Hypertension   . Iron deficiency anemia   . Menorrhagia   . Psoriasis   . STD (sexually transmitted disease)    HSV1  . Vertigo     Past Surgical History:  Procedure Laterality Date  . APPENDECTOMY      50 years old    Current Outpatient Prescriptions  Medication Sig Dispense Refill  . Apremilast (OTEZLA) 30 MG TABS Take 1 tablet by mouth 2 (two) times daily. 60 tablet 0  . Calcium Carbonate-Vitamin D (CALCIUM + D) 600-200 MG-UNIT TABS Take 1 tablet by mouth as needed.     .  Coenzyme Q10 (CO Q 10 PO) Take by mouth daily.    . hydrochlorothiazide (HYDRODIURIL) 25 MG tablet Take 25 mg by mouth daily.    Marland Kitchen KRILL OIL PO Take by mouth daily.    Marland Kitchen losartan (COZAAR) 25 MG tablet Take 25 mg by mouth daily.    . Multiple Vitamins-Minerals (MULTIVITAMIN PO) Take by mouth daily.    . norethindrone (NORA-BE) 0.35 MG tablet TAKE 1 TABLET BY MOUTH ONCE DAILY 3 Package 4  . pantoprazole (PROTONIX) 40 MG tablet Take 40 mg by mouth daily.    . TURMERIC PO Take by mouth daily.     No current facility-administered medications for this visit.     Family History  Problem Relation Age of Onset  . Cancer Mother     lung  . Cancer Brother     testicular  . Heart attack Brother   . Heart failure Father   . Stroke Sister     ROS:  Pertinent items are noted in HPI.  Otherwise, a comprehensive ROS was negative.  Exam:   BP 110/78   Pulse 72   Resp 16   Ht 5' 4.25" (1.632 m)   Wt 175 lb (79.4 kg)   BMI 29.81 kg/m  Height: 5' 4.25" (163.2 cm) Ht Readings from Last 3  Encounters:  11/02/15 5' 4.25" (1.632 m)  04/29/15 5' 4"  (1.626 m)  10/01/14 5' 4.25" (1.632 m)    General appearance: alert, cooperative and appears stated age Head: Normocephalic, without obvious abnormality, atraumatic Neck: no adenopathy, supple, symmetrical, trachea midline and thyroid normal to inspection and palpation Lungs: clear to auscultation bilaterally Breasts: normal appearance, no masses or tenderness, No nipple retraction or dimpling, No nipple discharge or bleeding, No axillary or supraclavicular adenopathy Heart: regular rate and rhythm Abdomen: soft, non-tender; no masses,  no organomegaly Extremities: extremities normal, atraumatic, no cyanosis or edema Skin: Skin color, texture, turgor normal. No rashes or lesions Lymph nodes: Cervical, supraclavicular, and axillary nodes normal. No abnormal inguinal nodes palpated Neurologic: Grossly normal   Pelvic: External genitalia:  no  lesions              Urethra:  normal appearing urethra with no masses, tenderness or lesions              Bartholin's and Skene's: normal                 Vagina: normal appearing vagina with normal color and discharge, no lesions              Cervix: no bleeding following Pap, no cervical motion tenderness and no lesions              Pap taken: Yes.   Bimanual Exam:  Uterus:  normal size, contour, position, consistency, mobility, non-tender and slight nodular feel on left, history of fibroid              Adnexa: normal adnexa and no mass, fullness, tenderness               Rectovaginal: Confirms               Anus:  normal sphincter tone, no lesions  Chaperone present: yes  A:  Well Woman with normal exam  Contraception POP  Perimenopausal with elevated FSH  History of uterine fibroid no size change  STD screening  Hypertension,Psoriasis management with PCP  P:   Reviewed health and wellness pertinent to exam  Discussed possibly stopping POP if AMH indicates infertility and has had no bleeding in one year, will advise once lab in. Patient would like to stop if not needed.  Discussed etiology of perimenopause/menopause and expectations. Questions addressed. Handout given.  Labs: GC,Chlamydia, HIV,RPR, Hep C  Continue follow up with PCP as indicated  Pap smear as above with HV reflex   counseled on breast self exam, mammography screening, STD prevention, HIV risk factors and prevention, adequate intake of calcium and vitamin D, diet and exercise  return annually or prn  An After Visit Summary was printed and given to the patient.

## 2015-11-03 ENCOUNTER — Other Ambulatory Visit: Payer: Self-pay | Admitting: Certified Nurse Midwife

## 2015-11-03 DIAGNOSIS — Z3041 Encounter for surveillance of contraceptive pills: Secondary | ICD-10-CM

## 2015-11-03 LAB — RPR

## 2015-11-03 LAB — HEPATITIS C ANTIBODY: HCV Ab: NEGATIVE

## 2015-11-03 LAB — HIV ANTIBODY (ROUTINE TESTING W REFLEX): HIV: NONREACTIVE

## 2015-11-03 MED FILL — NORETHINDRONE 0.35 MG TAB: 0.35 | 84 days supply | Qty: 84 | Fill #0

## 2015-11-03 NOTE — Telephone Encounter (Signed)
Medication refill request: NORA-BE 0.35mg   Last AEX:  11/02/15 DL Next AEX: none scheduled Last MMG (if hormonal medication request): 11/23/14 BIRADS1 negative Refill authorized: 10/01/14 #3 packs w/4 refills; today #3 packs w/4 refills?

## 2015-11-04 LAB — IPS PAP TEST WITH REFLEX TO HPV

## 2015-11-04 LAB — IPS N GONORRHOEA AND CHLAMYDIA BY PCR

## 2015-11-05 LAB — ANTI MULLERIAN HORMONE: AMH AssessR: <0.03 ng/mL

## 2015-11-05 NOTE — Progress Notes (Signed)
Encounter reviewed Lori Keenum, MD   

## 2015-11-08 LAB — FOLLICLE STIMULATING HORMONE: FSH: 70.4 m[IU]/mL

## 2015-11-08 MED FILL — OTEZLA 30 MG TABS: 30 | 30 days supply | Qty: 60 | Fill #0

## 2015-11-09 ENCOUNTER — Telehealth: Payer: Self-pay

## 2015-11-09 NOTE — Telephone Encounter (Signed)
Patient is returning a call to [name].

## 2015-11-09 NOTE — Telephone Encounter (Signed)
-----   Message from Regina Eck, CNM sent at 11/09/2015  7:46 AM EDT ----- Notify patient that [place] shows infertility and FSH shows menopausal range. She can stop POP after she completes next pack of pills. If any withdrawal bleeding needs to advise.

## 2015-11-09 NOTE — Telephone Encounter (Signed)
[email or web address]

## 2015-11-10 NOTE — Telephone Encounter (Signed)
Patient returning your call.  [name] you can leave her a detailed message if she does not answer the phone

## 2015-11-10 NOTE — Telephone Encounter (Signed)
Patient notified of results as written by provider 

## 2015-11-12 DIAGNOSIS — K219 Gastro-esophageal reflux disease without esophagitis: Secondary | ICD-10-CM | POA: Diagnosis not present

## 2015-11-12 DIAGNOSIS — F419 Anxiety disorder, unspecified: Secondary | ICD-10-CM | POA: Diagnosis not present

## 2015-11-12 DIAGNOSIS — I10 Essential (primary) hypertension: Secondary | ICD-10-CM | POA: Diagnosis not present

## 2015-11-12 DIAGNOSIS — Z1389 Encounter for screening for other disorder: Secondary | ICD-10-CM | POA: Diagnosis not present

## 2015-11-12 DIAGNOSIS — Z6830 Body mass index (BMI) 30.0-30.9, adult: Secondary | ICD-10-CM | POA: Diagnosis not present

## 2015-11-17 MED FILL — HYDROCHLOROTHIAZIDE 25 MG T: 25 | 90 days supply | Qty: 90 | Fill #0

## 2015-11-17 MED FILL — ALPRAZolam 0.25 MG TABS: 0.25 | 30 days supply | Qty: 120 | Fill #0

## 2015-11-17 MED FILL — LOSARTAN POTASSIUM 25 MG TA: 25 | 90 days supply | Qty: 90 | Fill #0

## 2015-11-24 ENCOUNTER — Encounter: Payer: Self-pay | Admitting: Family Medicine

## 2015-11-24 ENCOUNTER — Ambulatory Visit (INDEPENDENT_AMBULATORY_CARE_PROVIDER_SITE_OTHER): Payer: 59 | Admitting: Family Medicine

## 2015-11-24 DIAGNOSIS — M79672 Pain in left foot: Secondary | ICD-10-CM

## 2015-11-24 MED ORDER — IBUPROFEN 600 MG PO TABS: 600.0000 mg | ORAL_TABLET | Freq: Three times a day (TID) | ORAL | 1 refills | Status: DC | PRN

## 2015-11-24 MED FILL — IBUPROFEN 600 MG TABLET: 600 | 30 days supply | Qty: 90 | Fill #0

## 2015-11-24 NOTE — Patient Instructions (Addendum)
You have insertional Achilles Tendinitis Ibuprofen 642m three times a day with food for 7-10 days then as needed. Cam walker with heel lift when up and walking around for next week. Then would transition to tennis shoes or running shoes with the heel lift. Wait 1-2 weeks until your pain has resolved or is minimal before you start the rehab exercises Calf raises 3 sets of 10 on level ground once a day first. When these are easy, can do them one legged 3 sets of 10. Finally advance to doing them on a step. Can add heel walks, toe walks forward and backward as well Ice bucket 10-15 minutes at end of day - can ice 3-4 times a day. Avoid uneven ground, hills as much as possible. Consider physical therapy, orthotics, nitro patches if not improving as expected. Let me know how you're doing about a week from now (call or just stop by). I would recommend you do a job today where you are seated with minimal walking only.

## 2015-11-28 DIAGNOSIS — M79672 Pain in left foot: Secondary | ICD-10-CM | POA: Insufficient documentation

## 2015-11-28 NOTE — Assessment & Plan Note (Signed)
2/2 insertional achilles tendinitis.  Given severity will start with cam walker with heel lift.  Shown home exercises to start once pain calms down.  Hopefully can transition from cam walker to regular supportive shoe with heel lift.  Ibuprofen for 7-10 days then as needed.  Icing.  F/u in 1-2 weeks.  Consider PT, nitro patches, orthotics.

## 2015-11-28 NOTE — Progress Notes (Signed)
PCP: Glo Herring., MD  Subjective:   HPI: Patient is a 50 y.o. female here for left heel pain.  Patient reports she started to get pain at work on 10/17 posterior left heel. She had walked around [place] this weekend but no pain during or after this. Pain is 3/10, up to 8/10 with walking and movement. Tried ibuprofen with mild benefit. Pain worse by end of day. Described as throbbing. No skin changes, numbness.  Past Medical History:  Diagnosis Date  . Anemia    iron def anemia, iron infusion  . Bell's palsy   . Celiac disease   . Fibroid   . GERD (gastroesophageal reflux disease)   . History of pneumonia   . Hypertension   . Iron deficiency anemia   . Menorrhagia   . Psoriasis   . STD (sexually transmitted disease)    HSV1  . Vertigo     Current Outpatient Prescriptions on File Prior to Visit  Medication Sig Dispense Refill  . Apremilast (OTEZLA) 30 MG TABS Take 1 tablet by mouth 2 (two) times daily. 60 tablet 0  . Calcium Carbonate-Vitamin D (CALCIUM + D) 600-200 MG-UNIT TABS Take 1 tablet by mouth as needed.     . Coenzyme Q10 (CO Q 10 PO) Take by mouth daily.    . hydrochlorothiazide (HYDRODIURIL) 25 MG tablet Take 25 mg by mouth daily.    Marland Kitchen KRILL OIL PO Take by mouth daily.    Marland Kitchen losartan (COZAAR) 25 MG tablet Take 25 mg by mouth daily.    . Multiple Vitamins-Minerals (MULTIVITAMIN PO) Take by mouth daily.    . norethindrone (MICRONOR,CAMILA,ERRIN) 0.35 MG tablet TAKE 1 TABLET BY MOUTH ONCE DAILY 84 tablet 0  . pantoprazole (PROTONIX) 40 MG tablet Take 40 mg by mouth daily.    . TURMERIC PO Take by mouth daily.     No current facility-administered medications on file prior to visit.     Past Surgical History:  Procedure Laterality Date  . APPENDECTOMY      50 years old    Social History   Social History  . Marital status: Single    Spouse name: N/A  . Number of children: N/A  . Years of education: N/A   Occupational History  . Not on file.    Social History Main Topics  . Smoking status: Former Research scientist (life sciences)  . Smokeless tobacco: Never Used  . Alcohol use 3.6 - 4.8 oz/week    6 - 8 Standard drinks or equivalent per week  . Drug use: No  . Sexual activity: Yes    Partners: Male    Birth control/ protection: Pill   Other Topics Concern  . Not on file   Social History Narrative  . No narrative on file    Family History  Problem Relation Age of Onset  . Cancer Mother     lung  . Cancer Brother     testicular  . Heart attack Brother   . Heart failure Father   . Stroke Sister     BP (!) 148/83   Pulse 66   Ht 5\' 4"  (1.626 m)   Review of Systems: See HPI above.    Objective:  Physical Exam:  Gen: NAD, comfortable in exam room  Left foot/ankle: Very mild swelling insertion of achilles on calcaneus.  No other gross deformity, swelling, ecchymoses FROM TTP achilles at insertion on calcaneus. Negative ant drawer and talar tilt.   Negative syndesmotic compression. Negative calcaneal squeze. Thompsons  test negative. NV intact distally.  Right foot/ankle: FROM without pain.    Assessment & Plan:  1. Left foot pain - 2/2 insertional achilles tendinitis.  Given severity will start with cam walker with heel lift.  Shown home exercises to start once pain calms down.  Hopefully can transition from cam walker to regular supportive shoe with heel lift.  Ibuprofen for 7-10 days then as needed.  Icing.  F/u in 1-2 weeks.  Consider PT, nitro patches, orthotics.

## 2015-12-02 ENCOUNTER — Other Ambulatory Visit (HOSPITAL_BASED_OUTPATIENT_CLINIC_OR_DEPARTMENT_OTHER): Payer: Self-pay | Admitting: Internal Medicine

## 2015-12-02 DIAGNOSIS — Z1239 Encounter for other screening for malignant neoplasm of breast: Secondary | ICD-10-CM

## 2015-12-09 ENCOUNTER — Ambulatory Visit (HOSPITAL_BASED_OUTPATIENT_CLINIC_OR_DEPARTMENT_OTHER)
Admission: RE | Admit: 2015-12-09 | Discharge: 2015-12-09 | Disposition: A | Discharge status: Home / Self Care | Payer: 59 | Source: Ambulatory Visit | Attending: Internal Medicine | Admitting: Internal Medicine

## 2015-12-09 DIAGNOSIS — Z1389 Encounter for screening for other disorder: Secondary | ICD-10-CM | POA: Diagnosis not present

## 2015-12-09 DIAGNOSIS — D485 Neoplasm of uncertain behavior of skin: Secondary | ICD-10-CM | POA: Diagnosis not present

## 2015-12-09 DIAGNOSIS — L738 Other specified follicular disorders: Secondary | ICD-10-CM | POA: Diagnosis not present

## 2015-12-09 DIAGNOSIS — I781 Nevus, non-neoplastic: Secondary | ICD-10-CM | POA: Diagnosis not present

## 2015-12-09 DIAGNOSIS — I10 Essential (primary) hypertension: Secondary | ICD-10-CM | POA: Diagnosis not present

## 2015-12-09 DIAGNOSIS — E748 Other specified disorders of carbohydrate metabolism: Secondary | ICD-10-CM | POA: Diagnosis not present

## 2015-12-09 DIAGNOSIS — Z1231 Encounter for screening mammogram for malignant neoplasm of breast: Secondary | ICD-10-CM | POA: Diagnosis not present

## 2015-12-09 DIAGNOSIS — Z1239 Encounter for other screening for malignant neoplasm of breast: Secondary | ICD-10-CM

## 2015-12-09 DIAGNOSIS — L570 Actinic keratosis: Secondary | ICD-10-CM | POA: Diagnosis not present

## 2015-12-10 ENCOUNTER — Other Ambulatory Visit: Payer: Self-pay | Admitting: Pharmacist

## 2015-12-10 MED ORDER — APREMILAST 30 MG PO TABS: 1.0000 | ORAL_TABLET | Freq: Two times a day (BID) | ORAL | 5 refills | Status: DC

## 2015-12-10 MED FILL — OTEZLA 30 MG TABS: 30 | 30 days supply | Qty: 60 | Fill #0

## 2015-12-13 ENCOUNTER — Other Ambulatory Visit: Payer: Self-pay | Admitting: Internal Medicine

## 2015-12-13 DIAGNOSIS — R928 Other abnormal and inconclusive findings on diagnostic imaging of breast: Secondary | ICD-10-CM

## 2015-12-20 ENCOUNTER — Telehealth: Payer: Self-pay | Admitting: Family Medicine

## 2015-12-20 MED ORDER — NITROGLYCERIN 0.2 MG/HR TD PT24: MEDICATED_PATCH | TRANSDERMAL | 1 refills | Status: DC

## 2015-12-20 MED ORDER — HYDROCODONE-ACETAMINOPHEN 5-325 MG PO TABS: 1.0000 | ORAL_TABLET | Freq: Four times a day (QID) | ORAL | 0 refills | Status: DC | PRN

## 2015-12-20 MED FILL — HYDROCODON-APAP 5-325: 5-325 | 10 days supply | Qty: 40 | Fill #0

## 2015-12-20 MED FILL — IBUPROFEN 600 MG TABLET: 600 | 30 days supply | Qty: 90 | Fill #1

## 2015-12-20 MED FILL — NITROGLYCERIN 0.2 MG/HR PAT: 0.2 | 28 days supply | Qty: 7 | Fill #0

## 2015-12-20 NOTE — Telephone Encounter (Signed)
She has a few options - crutches or knee scooter to take pressure off of this, physical therapy, nitro patches (risks of headache, skin irritation).  If she wants to do a short course of a one time pain medicine that's ok - tramadol or hydrocodone.

## 2015-12-20 NOTE — Telephone Encounter (Signed)
Spoke to patient and she would like to try the nitro patch and the one time short course of the hydrocodone. Can send in the one for the patch to the pharmacy downstairs and she will pick up the other script.

## 2015-12-23 ENCOUNTER — Ambulatory Visit
Admission: RE | Admit: 2015-12-23 | Discharge: 2015-12-23 | Disposition: A | Discharge status: Home / Self Care | Payer: 59 | Source: Ambulatory Visit | Attending: Internal Medicine | Admitting: Internal Medicine

## 2015-12-23 DIAGNOSIS — R928 Other abnormal and inconclusive findings on diagnostic imaging of breast: Secondary | ICD-10-CM

## 2015-12-23 DIAGNOSIS — R921 Mammographic calcification found on diagnostic imaging of breast: Secondary | ICD-10-CM | POA: Diagnosis not present

## 2016-01-11 MED FILL — OTEZLA 30 MG TABS: 30 | 30 days supply | Qty: 60 | Fill #1

## 2016-01-11 MED FILL — ALPRAZolam 0.25 MG TABS: 0.25 | 30 days supply | Qty: 120 | Fill #1

## 2016-01-11 MED FILL — PANTOPRAZOLE SOD DR 40 MG T: 40 | 90 days supply | Qty: 90 | Fill #0

## 2016-02-09 MED FILL — OTEZLA 30 MG TABS: 30 | 30 days supply | Qty: 60 | Fill #2

## 2016-02-24 MED FILL — ALPRAZolam 0.25 MG TABS: 0.25 | 30 days supply | Qty: 120 | Fill #2

## 2016-02-24 MED FILL — HYDROCHLOROTHIAZIDE 25 MG T: 25 | 90 days supply | Qty: 90 | Fill #1

## 2016-02-24 MED FILL — LOSARTAN POTASSIUM 25 MG TA: 25 | 90 days supply | Qty: 90 | Fill #1

## 2016-03-14 MED FILL — OTEZLA 30 MG TABS: 30 | 30 days supply | Qty: 60 | Fill #3

## 2016-04-10 MED FILL — PANTOPRAZOLE SOD DR 40 MG T: 40 | 90 days supply | Qty: 90 | Fill #1

## 2016-04-11 MED FILL — OTEZLA 30 MG TABS: 30 | 30 days supply | Qty: 60 | Fill #4

## 2016-05-08 MED FILL — OTEZLA 30 MG TABS: 30 | 30 days supply | Qty: 60 | Fill #5

## 2016-05-26 MED FILL — HYDROCHLOROTHIAZIDE 25 MG T: 25 | 90 days supply | Qty: 90 | Fill #2

## 2016-06-07 ENCOUNTER — Other Ambulatory Visit: Payer: Self-pay | Admitting: Pharmacist

## 2016-06-07 MED ORDER — APREMILAST 30 MG PO TABS: 1.0000 | ORAL_TABLET | Freq: Two times a day (BID) | ORAL | 0 refills | Status: DC

## 2016-06-07 MED FILL — OTEZLA 30 MG TABS: 30 | 30 days supply | Qty: 60 | Fill #0

## 2016-06-08 DIAGNOSIS — Z79899 Other long term (current) drug therapy: Secondary | ICD-10-CM | POA: Diagnosis not present

## 2016-06-08 DIAGNOSIS — L400 Psoriasis vulgaris: Secondary | ICD-10-CM | POA: Diagnosis not present

## 2016-06-19 MED FILL — LOSARTAN POTASSIUM 25 MG TA: 25 | 90 days supply | Qty: 90 | Fill #2

## 2016-06-23 MED FILL — VALACYCLOVIR HCL 500 MG TAB: 500 | 30 days supply | Qty: 30 | Fill #0

## 2016-06-30 ENCOUNTER — Other Ambulatory Visit: Payer: Self-pay | Admitting: Certified Nurse Midwife

## 2016-06-30 DIAGNOSIS — R921 Mammographic calcification found on diagnostic imaging of breast: Secondary | ICD-10-CM

## 2016-07-05 MED FILL — PANTOPRAZOLE SOD DR 40 MG T: 40 | 90 days supply | Qty: 90 | Fill #2

## 2016-07-07 ENCOUNTER — Ambulatory Visit
Admission: RE | Admit: 2016-07-07 | Discharge: 2016-07-07 | Disposition: A | Discharge status: Home / Self Care | Payer: 59 | Source: Ambulatory Visit | Attending: Certified Nurse Midwife | Admitting: Certified Nurse Midwife

## 2016-07-07 DIAGNOSIS — R921 Mammographic calcification found on diagnostic imaging of breast: Secondary | ICD-10-CM

## 2016-07-07 DIAGNOSIS — R928 Other abnormal and inconclusive findings on diagnostic imaging of breast: Secondary | ICD-10-CM | POA: Diagnosis not present

## 2016-08-01 MED FILL — VALACYCLOVIR HCL 500 MG TAB: 500 | 30 days supply | Qty: 30 | Fill #1

## 2016-08-07 DIAGNOSIS — N390 Urinary tract infection, site not specified: Secondary | ICD-10-CM | POA: Diagnosis not present

## 2016-08-07 MED FILL — FLUCONAZOLE 100 MG TABLET: 100 | 1 days supply | Qty: 1 | Fill #0

## 2016-08-07 MED FILL — SULFAMETHOXAZOLE/TMP DS TAB: 800-160 | 5 days supply | Qty: 10 | Fill #0

## 2016-08-15 MED FILL — HYDROCHLOROTHIAZIDE 25 MG T: 25 | 90 days supply | Qty: 90 | Fill #3

## 2016-08-24 MED FILL — levoFLOXacin 750 MG TABS: 750 | 5 days supply | Qty: 5 | Fill #0

## 2016-09-08 MED FILL — VALACYCLOVIR HCL 500 MG TAB: 500 | 30 days supply | Qty: 30 | Fill #2

## 2016-09-20 MED FILL — LOSARTAN POTASSIUM 25 MG TA: 25 | 90 days supply | Qty: 90 | Fill #3

## 2016-10-10 MED FILL — PANTOPRAZOLE SOD DR 40 MG T: 40 | 90 days supply | Qty: 90 | Fill #3

## 2016-10-10 MED FILL — VALACYCLOVIR HCL 500 MG TAB: 500 | 30 days supply | Qty: 30 | Fill #3

## 2016-10-17 DIAGNOSIS — Z1389 Encounter for screening for other disorder: Secondary | ICD-10-CM | POA: Diagnosis not present

## 2016-10-17 DIAGNOSIS — E663 Overweight: Secondary | ICD-10-CM | POA: Diagnosis not present

## 2016-10-17 DIAGNOSIS — E782 Mixed hyperlipidemia: Secondary | ICD-10-CM | POA: Diagnosis not present

## 2016-10-17 DIAGNOSIS — I10 Essential (primary) hypertension: Secondary | ICD-10-CM | POA: Diagnosis not present

## 2016-10-17 DIAGNOSIS — K219 Gastro-esophageal reflux disease without esophagitis: Secondary | ICD-10-CM | POA: Diagnosis not present

## 2016-10-17 DIAGNOSIS — F419 Anxiety disorder, unspecified: Secondary | ICD-10-CM | POA: Diagnosis not present

## 2016-10-17 DIAGNOSIS — Z6829 Body mass index (BMI) 29.0-29.9, adult: Secondary | ICD-10-CM | POA: Diagnosis not present

## 2016-10-18 MED FILL — ALPRAZolam 0.25 MG TABS: 0.25 | 30 days supply | Qty: 120 | Fill #0

## 2016-10-18 MED FILL — VENTOLIN HFA 90 MCG INHALER: 108 (90 BAS | 25 days supply | Qty: 18 | Fill #0

## 2016-11-09 DIAGNOSIS — L400 Psoriasis vulgaris: Secondary | ICD-10-CM | POA: Diagnosis not present

## 2016-11-20 MED FILL — HYDROCHLOROTHIAZIDE 25 MG T: 25 | 90 days supply | Qty: 90 | Fill #0

## 2016-11-20 MED FILL — ALPRAZolam 0.25 MG TABS: 0.25 | 30 days supply | Qty: 120 | Fill #1

## 2016-11-23 DIAGNOSIS — Z79899 Other long term (current) drug therapy: Secondary | ICD-10-CM | POA: Diagnosis not present

## 2016-11-23 DIAGNOSIS — Z1389 Encounter for screening for other disorder: Secondary | ICD-10-CM | POA: Diagnosis not present

## 2016-11-29 ENCOUNTER — Other Ambulatory Visit: Payer: Self-pay | Admitting: Certified Nurse Midwife

## 2016-11-29 DIAGNOSIS — R921 Mammographic calcification found on diagnostic imaging of breast: Secondary | ICD-10-CM

## 2016-12-04 MED FILL — ATORVASTATIN 10 MG TABLET: 10 | 90 days supply | Qty: 90 | Fill #0

## 2016-12-07 DIAGNOSIS — Z1211 Encounter for screening for malignant neoplasm of colon: Secondary | ICD-10-CM | POA: Diagnosis not present

## 2016-12-07 DIAGNOSIS — K219 Gastro-esophageal reflux disease without esophagitis: Secondary | ICD-10-CM | POA: Diagnosis not present

## 2016-12-07 DIAGNOSIS — K900 Celiac disease: Secondary | ICD-10-CM | POA: Diagnosis not present

## 2016-12-07 DIAGNOSIS — K625 Hemorrhage of anus and rectum: Secondary | ICD-10-CM | POA: Diagnosis not present

## 2016-12-07 MED FILL — GAVILYTE-G SOLUTION: 236 | 1 days supply | Qty: 4000 | Fill #0

## 2016-12-08 DIAGNOSIS — R194 Change in bowel habit: Secondary | ICD-10-CM | POA: Diagnosis not present

## 2016-12-08 DIAGNOSIS — Z1211 Encounter for screening for malignant neoplasm of colon: Secondary | ICD-10-CM | POA: Diagnosis not present

## 2016-12-08 DIAGNOSIS — K625 Hemorrhage of anus and rectum: Secondary | ICD-10-CM | POA: Diagnosis not present

## 2016-12-11 ENCOUNTER — Ambulatory Visit
Admission: RE | Admit: 2016-12-11 | Discharge: 2016-12-11 | Disposition: A | Discharge status: Home / Self Care | Payer: 59 | Source: Ambulatory Visit | Attending: Certified Nurse Midwife | Admitting: Certified Nurse Midwife

## 2016-12-11 DIAGNOSIS — R928 Other abnormal and inconclusive findings on diagnostic imaging of breast: Secondary | ICD-10-CM | POA: Diagnosis not present

## 2016-12-11 DIAGNOSIS — R921 Mammographic calcification found on diagnostic imaging of breast: Secondary | ICD-10-CM

## 2016-12-18 MED FILL — ALPRAZolam 0.25 MG TABS: 0.25 | 30 days supply | Qty: 120 | Fill #2

## 2016-12-18 MED FILL — LOSARTAN POTASSIUM 25 MG TA: 25 | 90 days supply | Qty: 90 | Fill #0

## 2016-12-18 MED FILL — VENTOLIN HFA 90 MCG INHALER: 108 (90 BAS | 25 days supply | Qty: 18 | Fill #1

## 2017-01-11 MED FILL — PANTOPRAZOLE SOD DR 40 MG T: 40 | 90 days supply | Qty: 90 | Fill #0

## 2017-01-18 ENCOUNTER — Ambulatory Visit (INDEPENDENT_AMBULATORY_CARE_PROVIDER_SITE_OTHER): Payer: 59 | Admitting: Certified Nurse Midwife

## 2017-01-18 ENCOUNTER — Other Ambulatory Visit: Payer: Self-pay

## 2017-01-18 ENCOUNTER — Encounter: Payer: Self-pay | Admitting: Certified Nurse Midwife

## 2017-01-18 VITALS — BP 128/70 | HR 70 | Resp 12 | Ht 64.0 in | Wt 182.0 lb

## 2017-01-18 DIAGNOSIS — Z113 Encounter for screening for infections with a predominantly sexual mode of transmission: Secondary | ICD-10-CM | POA: Diagnosis not present

## 2017-01-18 DIAGNOSIS — Z01419 Encounter for gynecological examination (general) (routine) without abnormal findings: Secondary | ICD-10-CM

## 2017-01-18 DIAGNOSIS — N951 Menopausal and female climacteric states: Secondary | ICD-10-CM

## 2017-01-18 NOTE — Patient Instructions (Signed)

## 2017-01-18 NOTE — Progress Notes (Signed)
51 y.o. G0P0000 Single  Caucasian Fe here for annual exam. Menopausal no HRT. Denies vaginal dryness or vaginal bleeding. Had rectal bleeding and was evaluated by Dr. Collene Mares, no findings or concerns. New/old partner, desires STD screening., no symptoms. Sees PCP Dr. Gerarda Fraction for labs and aex, hypertension,cholesterol management. All stable at present. No other health issues today..  Patient's last menstrual period was 03/01/2013.          Sexually active: Yes.    The current method of family planning is post menopausal status.    Exercising: Yes.    cardio, weights, yoga  Smoker:  Former smoker  Health Maintenance: Pap:  11/02/15 negative, 08/25/13 negative  History of Abnormal Pap: no MMG:  12/11/16 cat b density/BIRAD 3 probably benign  Self Breast exams: occasionally  Colonoscopy:  2018 negative  Dr. Collene Mares  BMD:   never TDaP:  10/08/15  Shingles: no Pneumonia: no  Hep C and HIV: 2017 negative Labs: PCP   reports that she has quit smoking. she has never used smokeless tobacco. She reports that she drinks about 3.6 - 4.8 oz of alcohol per week. She reports that she does not use drugs.  Past Medical History:  Diagnosis Date  . Anemia    iron def anemia, iron infusion  . Bell's palsy   . Celiac disease   . Fibroid   . GERD (gastroesophageal reflux disease)   . History of pneumonia   . Hypertension   . Iron deficiency anemia   . Menorrhagia   . Psoriasis   . STD (sexually transmitted disease)    HSV1  . Vertigo     Past Surgical History:  Procedure Laterality Date  . APPENDECTOMY      51 years old    Current Outpatient Medications  Medication Sig Dispense Refill  . ALPRAZolam (XANAX) 0.25 MG tablet   2  . atorvastatin (LIPITOR) 10 MG tablet   3  . Calcium Carbonate-Vitamin D (CALCIUM + D) 600-200 MG-UNIT TABS Take 1 tablet by mouth as needed.     . Coenzyme Q10 (CO Q 10 PO) Take by mouth daily.    . hydrochlorothiazide (HYDRODIURIL) 25 MG tablet Take 25 mg by mouth daily.    Marland Kitchen  ibuprofen (ADVIL,MOTRIN) 600 MG tablet Take 1 tablet (600 mg total) by mouth every 8 (eight) hours as needed. 90 tablet 1  . Ixekizumab (TALTZ [place]) Inject into the skin every 14 (fourteen) days.    Marland Kitchen KRILL OIL PO Take by mouth daily.    Marland Kitchen losartan (COZAAR) 25 MG tablet Take 25 mg by mouth daily.    . Multiple Vitamins-Minerals (MULTIVITAMIN PO) Take by mouth daily.    . nitroGLYCERIN (NITRODUR - DOSED IN MG/24 HR) 0.2 mg/hr patch Apply 1/4th patch to affected achilles, change daily 30 patch 1  . pantoprazole (PROTONIX) 40 MG tablet Take 40 mg by mouth daily.    . TURMERIC PO Take by mouth daily.    . VENTOLIN HFA 108 (90 Base) MCG/ACT inhaler   11   No current facility-administered medications for this visit.     Family History  Problem Relation Age of Onset  . Cancer Mother        lung  . Cancer Brother        testicular  . Heart attack Brother   . Heart failure Father   . Stroke Sister     ROS:  Pertinent items are noted in HPI.  Otherwise, a comprehensive ROS was negative.  Exam:   BP 128/70 (BP Location: Right Arm, Patient Position: Sitting, Cuff Size: Normal)   Pulse 70   Resp 12   Ht 5\' 4"  (1.626 m)   Wt 182 lb (82.6 kg)   LMP 03/01/2013   BMI 31.24 kg/m  Height: 5\' 4"  (162.6 cm) Ht Readings from Last 3 Encounters:  01/18/17 5\' 4"  (1.626 m)  11/24/15 5\' 4"  (1.626 m)  11/02/15 5' 4.25" (1.632 m)    General appearance: alert, cooperative and appears stated age Head: Normocephalic, without obvious abnormality, atraumatic Neck: no adenopathy, supple, symmetrical, trachea midline and thyroid normal to inspection and palpation Lungs: clear to auscultation bilaterally Breasts: normal appearance, no masses or tenderness, No nipple retraction or dimpling, No nipple discharge or bleeding, No axillary or supraclavicular adenopathy Heart: regular rate and rhythm Abdomen: soft, non-tender; no masses,  no organomegaly Extremities: extremities normal, atraumatic, no cyanosis or  edema Skin: Skin color, texture, turgor normal. No rashes or lesions Lymph nodes: Cervical, supraclavicular, and axillary nodes normal. No abnormal inguinal nodes palpated Neurologic: Grossly normal   Pelvic: External genitalia:  no lesions              Urethra:  normal appearing urethra with no masses, tenderness or lesions              Bartholin's and Skene's: normal                 Vagina: normal appearing vagina with normal color and discharge, no lesions              Cervix: no cervical motion tenderness, no lesions and normal appearacne              Pap taken: No. Bimanual Exam:  Uterus:  normal size, contour, position, consistency, mobility, non-tender              Adnexa: normal adnexa and no mass, fullness, tenderness               Rectovaginal: Confirms               Anus:  normal sphincter tone, no lesions  Chaperone present: yes  A:  Well Woman with normal exam  Menopausal no HRT  STD screening desired   Hypertension/cholesterol with MD management  Rectal bleeding occurrence with negative evaluation with colonoscopy  P:   Reviewed health and wellness pertinent to exam  Aware of need to evaluate if vaginal bleeding  Labs: STD panel, Hep. C, GC/Chlamydia, Vaginal screen  Continue follow up with MD as indicated  Pap smear: no   counseled on breast self exam, mammography screening, STD prevention, HIV risk factors and prevention, adequate intake of calcium and vitamin D, diet and exercise  return annually or prn  An After Visit Summary was printed and given to the patient.

## 2017-01-19 LAB — HEPATITIS C ANTIBODY: Hep C Virus Ab: <0.1 s/co ratio (ref 0.0–0.9)

## 2017-01-19 LAB — HEP, RPR, HIV PANEL
HIV SCREEN 4TH GENERATION: NONREACTIVE
Hepatitis B Surface Ag: NEGATIVE
RPR Ser Ql: NONREACTIVE

## 2017-01-19 LAB — VAGINITIS/VAGINOSIS, DNA PROBE
Candida Species: NEGATIVE
GARDNERELLA VAGINALIS: POSITIVE — AB
TRICHOMONAS VAG: NEGATIVE

## 2017-01-20 ENCOUNTER — Other Ambulatory Visit: Payer: Self-pay | Admitting: Certified Nurse Midwife

## 2017-01-20 DIAGNOSIS — N760 Acute vaginitis: Secondary | ICD-10-CM

## 2017-01-20 DIAGNOSIS — B9689 Other specified bacterial agents as the cause of diseases classified elsewhere: Secondary | ICD-10-CM

## 2017-01-20 LAB — CHLAMYDIA/GC NAA, CONFIRMATION
Chlamydia trachomatis, NAA: NEGATIVE
NEISSERIA GONORRHOEAE, NAA: NEGATIVE

## 2017-01-20 MED ORDER — METRONIDAZOLE 0.75 % VA GEL: VAGINAL | 0 refills | Status: DC

## 2017-01-22 MED FILL — metroNIDAZOLE 0.75 % GEL: 0.75 | 7 days supply | Qty: 70 | Fill #0

## 2017-02-21 MED FILL — HYDROCHLOROTHIAZIDE 25 MG T: 25 | 90 days supply | Qty: 90 | Fill #1

## 2017-03-08 MED FILL — ATORVASTATIN 10 MG TABLET: 10 | 90 days supply | Qty: 90 | Fill #1

## 2017-03-16 DIAGNOSIS — L400 Psoriasis vulgaris: Secondary | ICD-10-CM | POA: Diagnosis not present

## 2017-03-16 DIAGNOSIS — D220 Melanocytic nevi of lip: Secondary | ICD-10-CM | POA: Diagnosis not present

## 2017-03-21 MED FILL — LOSARTAN POTASSIUM 25 MG TA: 25 | 90 days supply | Qty: 90 | Fill #1

## 2017-04-11 MED FILL — PANTOPRAZOLE SOD DR 40 MG T: 40 | 90 days supply | Qty: 90 | Fill #1

## 2017-05-18 MED FILL — HYDROCHLOROTHIAZIDE 25 MG T: 25 | 90 days supply | Qty: 90 | Fill #2

## 2017-05-22 ENCOUNTER — Telehealth: Payer: 59 | Admitting: Family

## 2017-05-22 DIAGNOSIS — M5440 Lumbago with sciatica, unspecified side: Secondary | ICD-10-CM | POA: Diagnosis not present

## 2017-05-22 MED ORDER — CYCLOBENZAPRINE HCL 10 MG PO TABS: 10.0000 mg | ORAL_TABLET | Freq: Three times a day (TID) | ORAL | 0 refills | Status: DC | PRN

## 2017-05-22 MED ORDER — NAPROXEN 500 MG PO TABS: 500.0000 mg | ORAL_TABLET | Freq: Two times a day (BID) | ORAL | 0 refills | Status: DC

## 2017-05-22 MED FILL — CYCLOBENZAPRINE 10 MG TAB: 10 | 10 days supply | Qty: 30 | Fill #0

## 2017-05-22 MED FILL — NAPROXEN 500 MG TABLET: 500 | 15 days supply | Qty: 30 | Fill #0

## 2017-05-22 NOTE — Progress Notes (Signed)

## 2017-06-06 MED FILL — ATORVASTATIN 10 MG TABLET: 10 | 90 days supply | Qty: 90 | Fill #2

## 2017-06-13 MED FILL — VENTOLIN HFA 90 MCG INHALER: 108 (90 BAS | 25 days supply | Qty: 18 | Fill #2

## 2017-06-19 MED FILL — LOSARTAN POTASSIUM 25 MG TA: 25 | 90 days supply | Qty: 90 | Fill #2

## 2017-06-21 DIAGNOSIS — L400 Psoriasis vulgaris: Secondary | ICD-10-CM | POA: Diagnosis not present

## 2017-07-10 DIAGNOSIS — E559 Vitamin D deficiency, unspecified: Secondary | ICD-10-CM | POA: Diagnosis not present

## 2017-07-10 DIAGNOSIS — L409 Psoriasis, unspecified: Secondary | ICD-10-CM | POA: Diagnosis not present

## 2017-07-10 DIAGNOSIS — K900 Celiac disease: Secondary | ICD-10-CM | POA: Diagnosis not present

## 2017-07-11 MED FILL — PANTOPRAZOLE SOD DR 40 MG T: 40 | 90 days supply | Qty: 90 | Fill #2

## 2017-08-13 MED FILL — HYDROCHLOROTHIAZIDE 25 MG T: 25 | 90 days supply | Qty: 90 | Fill #3

## 2017-09-10 MED FILL — ATORVASTATIN CALCIUM 10 MG: 10 | 90 days supply | Qty: 90 | Fill #3

## 2017-09-10 MED FILL — LOSARTAN POTASSIUM 25 MG TA: 25 | 90 days supply | Qty: 90 | Fill #3

## 2017-10-09 MED FILL — PANTOPRAZOLE SOD DR 40 MG T: 40 | 90 days supply | Qty: 90 | Fill #3

## 2017-10-15 DIAGNOSIS — Z0001 Encounter for general adult medical examination with abnormal findings: Secondary | ICD-10-CM | POA: Diagnosis not present

## 2017-10-15 DIAGNOSIS — K219 Gastro-esophageal reflux disease without esophagitis: Secondary | ICD-10-CM | POA: Diagnosis not present

## 2017-10-15 DIAGNOSIS — I10 Essential (primary) hypertension: Secondary | ICD-10-CM | POA: Diagnosis not present

## 2017-10-15 DIAGNOSIS — E6609 Other obesity due to excess calories: Secondary | ICD-10-CM | POA: Diagnosis not present

## 2017-10-15 DIAGNOSIS — E782 Mixed hyperlipidemia: Secondary | ICD-10-CM | POA: Diagnosis not present

## 2017-10-15 DIAGNOSIS — R011 Cardiac murmur, unspecified: Secondary | ICD-10-CM | POA: Diagnosis not present

## 2017-10-15 DIAGNOSIS — Z6830 Body mass index (BMI) 30.0-30.9, adult: Secondary | ICD-10-CM | POA: Diagnosis not present

## 2017-10-15 DIAGNOSIS — F419 Anxiety disorder, unspecified: Secondary | ICD-10-CM | POA: Diagnosis not present

## 2017-10-15 DIAGNOSIS — Z1389 Encounter for screening for other disorder: Secondary | ICD-10-CM | POA: Diagnosis not present

## 2017-10-17 MED FILL — ALPRAZolam 0.25 MG TABS: 0.25 | 30 days supply | Qty: 120 | Fill #0

## 2017-10-25 DIAGNOSIS — E785 Hyperlipidemia, unspecified: Secondary | ICD-10-CM | POA: Diagnosis not present

## 2017-10-25 DIAGNOSIS — Z1389 Encounter for screening for other disorder: Secondary | ICD-10-CM | POA: Diagnosis not present

## 2017-11-15 ENCOUNTER — Other Ambulatory Visit: Payer: Self-pay

## 2017-11-15 ENCOUNTER — Encounter (HOSPITAL_COMMUNITY): Payer: Self-pay | Admitting: Emergency Medicine

## 2017-11-15 ENCOUNTER — Ambulatory Visit (INDEPENDENT_AMBULATORY_CARE_PROVIDER_SITE_OTHER): Payer: 59

## 2017-11-15 ENCOUNTER — Ambulatory Visit (HOSPITAL_COMMUNITY)
Admission: EM | Admit: 2017-11-15 | Discharge: 2017-11-15 | Disposition: A | Discharge status: Home / Self Care | Payer: 59 | Attending: Nurse Practitioner | Admitting: Nurse Practitioner

## 2017-11-15 DIAGNOSIS — M79671 Pain in right foot: Secondary | ICD-10-CM

## 2017-11-15 MED ORDER — DICLOFENAC SODIUM 50 MG PO TBEC: 50.0000 mg | DELAYED_RELEASE_TABLET | Freq: Three times a day (TID) | ORAL | 0 refills | Status: DC | PRN

## 2017-11-15 MED ORDER — METHYLPREDNISOLONE 4 MG PO TBPK: ORAL_TABLET | ORAL | 0 refills | Status: DC

## 2017-11-15 MED FILL — METHYLPREDNISOLONE 4 MG TAB: 4 | 6 days supply | Qty: 21 | Fill #0

## 2017-11-15 MED FILL — DICLOFENAC SOD EC 50 MG TAB: 50 | 7 days supply | Qty: 21 | Fill #0

## 2017-11-15 NOTE — Discharge Instructions (Signed)
Take medications as prescribed. Follow-up with ortho if symptoms persists, worsens or re-occur

## 2017-11-15 NOTE — ED Triage Notes (Signed)
Pt reports right great toe and medial foot swelling that has occurred twice after working out. Once was after jumping jacks and the other after plank exercises.

## 2017-11-15 NOTE — ED Provider Notes (Signed)
[name]    CSN: [id number] Arrival date & time: 11/15/17  0802     History   Chief Complaint Chief Complaint  Patient presents with  . Foot Pain    right    HPI [name] is a 52 y.o. female.   [name] is a 52 y.o. female that complains of pain in the medial aspect of the right foot without radiation after an exercise class 7 days ago. Onset of symptoms was abrupt starting 6 days ago. Patient describes pain as aching and sharp/stabbing. Pain severity now is 5 /10 and at worst was 8 /10. Pain is aggravated by movement, weight bearing and palpation. Pain is alleviated some by NSAIDS (motrin 800 mg). She denies any numbness, tingling, weakness or inability to bear weight. The patient denies other injuries.  Care prior to arrival consisted of rest and NSAID, with minimal relief.       Past Medical History:  Diagnosis Date  . Anemia    iron def anemia, iron infusion  . Bell's palsy   . Celiac disease   . Fibroid   . GERD (gastroesophageal reflux disease)   . History of pneumonia   . Hypertension   . Iron deficiency anemia   . Menorrhagia   . Psoriasis   . STD (sexually transmitted disease)    HSV1  . Vertigo     Patient Active Problem List   Diagnosis Date Noted  . Left foot pain 11/28/2015  . Celiac disease 09/04/2013    Class: History of  . Iron deficiency anemia 09/23/2010    Past Surgical History:  Procedure Laterality Date  . APPENDECTOMY      52 years old    OB History    Gravida  0   Para  0   Term  0   Preterm  0   AB  0   Living  0     SAB  0   TAB  0   Ectopic  0   Multiple  0   Live Births               Home Medications    Prior to Admission medications   Medication Sig Start Date End Date Taking? Authorizing Provider  ALPRAZolam Duanne Moron) 0.25 MG tablet  12/18/16  Yes [provider]  atorvastatin (LIPITOR) 10 MG tablet  12/04/16  Yes [provider]  Calcium  Carbonate-Vitamin D (CALCIUM + D) 600-200 MG-UNIT TABS Take 1 tablet by mouth as needed.    Yes [provider]  Coenzyme Q10 (CO Q 10 PO) Take by mouth daily.   Yes [provider]  hydrochlorothiazide (HYDRODIURIL) 25 MG tablet Take 25 mg by mouth daily.   Yes [provider]  KRILL OIL PO Take by mouth daily.   Yes [provider]  losartan (COZAAR) 25 MG tablet Take 25 mg by mouth daily.   Yes [provider]  Multiple Vitamins-Minerals (MULTIVITAMIN PO) Take by mouth daily.   Yes [provider]  pantoprazole (PROTONIX) 40 MG tablet Take 40 mg by mouth daily.   Yes [provider]  TURMERIC PO Take by mouth daily.   Yes [provider]  cyclobenzaprine (FLEXERIL) 10 MG tablet Take 1 tablet (10 mg total) by mouth 3 (three) times daily as needed for muscle spasms. 05/22/17   Kennyth Arnold, FNP  diclofenac (VOLTAREN) 50 MG EC tablet Take 1 tablet (50 mg total) by mouth 3 (  three) times daily as needed. 11/15/17   Enrique Sack, FNP  methylPREDNISolone (MEDROL DOSEPAK) 4 MG TBPK tablet Take as directed 11/15/17   Enrique Sack, FNP  metroNIDAZOLE (METROGEL) 0.75 % vaginal gel One applicator every night vaginally for 7 nights 01/20/17   Ladell Heads  VENTOLIN HFA 108 [phone]) MCG/ACT inhaler  12/18/16   [provider]    Family History Family History  Problem Relation Age of Onset  . Cancer Mother        lung  . Cancer Brother        testicular  . Heart attack Brother   . Heart failure Father   . Stroke Sister     Social History Social History   Tobacco Use  . Smoking status: Former Research scientist (life sciences)  . Smokeless tobacco: Never Used  Substance Use Topics  . Alcohol use: Yes    Alcohol/week: 6.0 - 8.0 standard drinks    Types: 6 - 8 Standard drinks or equivalent per week  . Drug use: No     Allergies   Augmentin [amoxicillin-pot clavulanate]; Erythromycin; and Bactrim  [sulfamethoxazole-trimethoprim]   Review of Systems Review of Systems  Musculoskeletal:       Right foot pain  All other systems reviewed and are negative.    Physical Exam Triage Vital Signs ED Triage Vitals  Enc Vitals Group     BP 11/15/17 0827 (!) 142/67     Pulse Rate 11/15/17 0827 67     Resp --      Temp --      Temp Source 11/15/17 0827 Oral     SpO2 11/15/17 0827 99 %     Weight --      Height --      Head Circumference --      Peak Flow --      Pain Score 11/15/17 0828 5     Pain Loc --      Pain Edu? --      Excl. in [place]? --    No data found.  Updated Vital Signs BP (!) 142/67 (BP Location: Left Arm)   Pulse 67   LMP 03/01/2013   SpO2 99%   Visual Acuity Right Eye Distance:   Left Eye Distance:   Bilateral Distance:    Right Eye Near:   Left Eye Near:    Bilateral Near:     Physical Exam  Constitutional: She is oriented to person, place, and time. She appears well-developed and well-nourished.  Cardiovascular: Normal rate and regular rhythm.  Pulmonary/Chest: Effort normal and breath sounds normal.  Musculoskeletal: Normal range of motion.       Right foot: There is normal range of motion and no deformity.       Feet:  Feet:  Right Foot:  Skin Integrity: Positive for callus and dry skin. Negative for ulcer, blister, skin breakdown, erythema or warmth.  Neurological: She is alert and oriented to person, place, and time.  Skin: Skin is warm and dry.  Psychiatric: She has a normal mood and affect.     UC Treatments / Results  Labs (all labs ordered are listed, but only abnormal results are displayed) Labs Reviewed - No data to display  EKG None  Radiology Dg Foot Complete Right  Result Date: 11/15/2017 CLINICAL DATA:  Pain after exercise EXAM: RIGHT FOOT COMPLETE - 3+ VIEW COMPARISON:  None. FINDINGS: Frontal, oblique, and lateral views obtained. No evident fracture or dislocation. There is mild narrowing of  all PIP and DIP joints. No  erosive changes. There is a small posterior calcaneal spur. IMPRESSION: Slight narrowing of several distal joints. No erosive change. No fracture or dislocation. Electronically Signed   By: Lowella Grip III M.D.   On: 11/15/2017 09:32    Procedures Procedures (including critical care time)  Medications Ordered in UC Medications - No data to display  Initial Impression / Assessment and Plan / UC Course  I have reviewed the triage vital signs and the nursing notes.  Pertinent labs & imaging results that were available during my care of the patient were reviewed by me and considered in my medical decision making (see chart for details).    52 year old female presenting with increasing pain to the medial aspect of the right foot after participating in an exercise class about 1 week ago. Tenderness present on exam but no erythema, warmth or swelling noted. X-ray shows slight narrowing of several distal joints but no erosive changes, fracture or dislocation. Supportive care advised for now with short course of steroids and NSAIDs. Follow-up with ortho if no improvement, worsening or reoccurrence of symptoms.   Today's evaluation has revealed no signs of a dangerous process. Discussed diagnosis with patient. Patient aware of their diagnosis, possible red flag symptoms to watch out for and need for close follow up. Patient understands verbal and written discharge instructions. Patient comfortable with plan and disposition.  Patient has a clear mental status at this time, good insight into illness (after discussion and teaching) and has clear judgment to make decisions regarding their care.  Documentation was completed with the aid of voice recognition software. Transcription may contain typographical errors.  Final Clinical Impressions(s) / UC Diagnoses   Final diagnoses:  Foot pain, right     Discharge Instructions     Take medications as prescribed. Follow-up with ortho if symptoms  persists, worsens or re-occur     ED Prescriptions    Medication Sig Dispense Auth. Provider   diclofenac (VOLTAREN) 50 MG EC tablet Take 1 tablet (50 mg total) by mouth 3 (three) times daily as needed. 21 tablet Enrique Sack, FNP   methylPREDNISolone (MEDROL DOSEPAK) 4 MG TBPK tablet Take as directed 21 tablet Enrique Sack, FNP     Controlled Substance Prescriptions  Controlled Substance Registry consulted? Not Applicable   Enrique Sack, [place] 11/15/17 [phone]

## 2017-11-16 MED FILL — HYDROCHLOROTHIAZIDE 25 MG T: 25 | 90 days supply | Qty: 90 | Fill #0

## 2017-11-16 MED FILL — ALPRAZolam 0.25 MG TABS: 0.25 | 30 days supply | Qty: 120 | Fill #1

## 2017-11-27 ENCOUNTER — Ambulatory Visit: Payer: 59 | Admitting: Internal Medicine

## 2017-12-05 MED FILL — LOSARTAN POTASSIUM 25 MG TA: 25 | 90 days supply | Qty: 90 | Fill #0

## 2017-12-27 MED FILL — ALPRAZolam 0.25 MG TABS: 0.25 | 30 days supply | Qty: 120 | Fill #2

## 2018-01-09 MED FILL — PANTOPRAZOLE SOD DR 40 MG T: 40 | 90 days supply | Qty: 90 | Fill #0

## 2018-01-15 MED FILL — ATORVASTATIN 10 MG TABLET: 10 | 90 days supply | Qty: 90 | Fill #0

## 2018-01-22 ENCOUNTER — Other Ambulatory Visit (HOSPITAL_COMMUNITY)
Admission: RE | Admit: 2018-01-22 | Discharge: 2018-01-22 | Disposition: A | Discharge status: Home / Self Care | Payer: 59 | Source: Ambulatory Visit | Attending: Certified Nurse Midwife | Admitting: Certified Nurse Midwife

## 2018-01-22 ENCOUNTER — Encounter: Payer: Self-pay | Admitting: Certified Nurse Midwife

## 2018-01-22 ENCOUNTER — Ambulatory Visit (INDEPENDENT_AMBULATORY_CARE_PROVIDER_SITE_OTHER): Payer: 59 | Admitting: Certified Nurse Midwife

## 2018-01-22 ENCOUNTER — Other Ambulatory Visit: Payer: Self-pay

## 2018-01-22 VITALS — BP 124/72 | HR 70 | Resp 16 | Ht 64.25 in | Wt 188.0 lb

## 2018-01-22 DIAGNOSIS — N951 Menopausal and female climacteric states: Secondary | ICD-10-CM

## 2018-01-22 DIAGNOSIS — Z01419 Encounter for gynecological examination (general) (routine) without abnormal findings: Secondary | ICD-10-CM

## 2018-01-22 DIAGNOSIS — Z124 Encounter for screening for malignant neoplasm of cervix: Secondary | ICD-10-CM | POA: Diagnosis not present

## 2018-01-22 DIAGNOSIS — Z8669 Personal history of other diseases of the nervous system and sense organs: Secondary | ICD-10-CM

## 2018-01-22 DIAGNOSIS — Z86018 Personal history of other benign neoplasm: Secondary | ICD-10-CM

## 2018-01-22 NOTE — Patient Instructions (Signed)

## 2018-01-22 NOTE — Progress Notes (Signed)
52 y.o. G0P0000 Single  Caucasian Fe here for annual exam. Menopausal no vaginal bleeding or dryness. Occasional hot flashes, no issues. Saw PCP for aex/labs/ medication management of cholesterol, hypertension and heard murmur and has appointment for evaluation soon. Not symptomatic that she is aware of.  Has been having big toes pain and discomfort, was given steroids and this resolved. No other health issues today.  Patient's last menstrual period was 03/01/2013.          Sexually active: No.  The current method of family planning is post menopausal status.    Exercising: Yes.    cardio, weights & yoga Smoker:  no  Review of Systems  Constitutional: Negative.   HENT: Negative.   Eyes: Negative.   Respiratory: Negative.   Cardiovascular: Negative.   Gastrointestinal: Negative.   Genitourinary: Negative.   Musculoskeletal: Negative.   Skin: Negative.   Neurological: Negative.   Endo/Heme/Allergies: Negative.   Psychiatric/Behavioral: Negative.     Health Maintenance: Pap:  11-02-15 neg History of Abnormal Pap: no MMG:  12-11-16 category b density birads 3:prob benign Self Breast exams: occ Colonoscopy:  2018 neg BMD:   none TDaP:  2017 Shingles: no Pneumonia: no Hep C and HIV: both neg 2018 Labs: if needed.   reports that she has quit smoking. She has never used smokeless tobacco. She reports current alcohol use of about 6.0 - 8.0 standard drinks of alcohol per week. She reports that she does not use drugs.  Past Medical History:  Diagnosis Date  . Anemia    iron def anemia, iron infusion  . Bell's palsy   . Celiac disease   . Fibroid   . GERD (gastroesophageal reflux disease)   . History of pneumonia   . Hypertension   . Iron deficiency anemia   . Menorrhagia   . Psoriasis   . STD (sexually transmitted disease)    HSV1  . Vertigo     Past Surgical History:  Procedure Laterality Date  . APPENDECTOMY      52 years old    Current Outpatient Medications   Medication Sig Dispense Refill  . ALPRAZolam (XANAX) 0.25 MG tablet   2  . atorvastatin (LIPITOR) 10 MG tablet   3  . Calcium Carbonate-Vitamin D (CALCIUM + D) 600-200 MG-UNIT TABS Take 1 tablet by mouth as needed.     . Coenzyme Q10 (CO Q 10 PO) Take by mouth daily.    . cyclobenzaprine (FLEXERIL) 10 MG tablet Take 1 tablet (10 mg total) by mouth 3 (three) times daily as needed for muscle spasms. 30 tablet 0  . diclofenac (VOLTAREN) 50 MG EC tablet Take 1 tablet (50 mg total) by mouth 3 (three) times daily as needed. 21 tablet 0  . hydrochlorothiazide (HYDRODIURIL) 25 MG tablet Take 25 mg by mouth daily.    Marland Kitchen KRILL OIL PO Take by mouth daily.    Marland Kitchen losartan (COZAAR) 25 MG tablet Take 25 mg by mouth daily.    . methylPREDNISolone (MEDROL DOSEPAK) 4 MG TBPK tablet Take as directed 21 tablet 0  . metroNIDAZOLE (METROGEL) 0.75 % vaginal gel One applicator every night vaginally for 7 nights 70 g 0  . Multiple Vitamins-Minerals (MULTIVITAMIN PO) Take by mouth daily.    . pantoprazole (PROTONIX) 40 MG tablet Take 40 mg by mouth daily.    . TURMERIC PO Take by mouth daily.    . VENTOLIN HFA 108 (90 Base) MCG/ACT inhaler   11   No current facility-administered  medications for this visit.     Family History  Problem Relation Age of Onset  . Cancer Mother        lung  . Cancer Brother        testicular  . Heart attack Brother   . Heart failure Father   . Stroke Sister     ROS:  Pertinent items are noted in HPI.  Otherwise, a comprehensive ROS was negative.  Exam:   LMP 03/01/2013    Ht Readings from Last 3 Encounters:  01/18/17 5\' 4"  (1.626 m)  11/24/15 5\' 4"  (1.626 m)  11/02/15 5' 4.25" (1.632 m)    General appearance: alert, cooperative and appears stated age Head: Normocephalic, without obvious abnormality, atraumatic Neck: no adenopathy, supple, symmetrical, trachea midline and thyroid normal to inspection and palpation Lungs: clear to auscultation bilaterally Breasts: normal  appearance, no masses or tenderness, No nipple retraction or dimpling, No nipple discharge or bleeding, No axillary or supraclavicular adenopathy Heart: regular rate and rhythm with grade 2/4 murmur heard. Abdomen: soft, non-tender; no masses,  no organomegaly Extremities: extremities normal, atraumatic, no cyanosis or edema Skin: Skin color, texture, turgor normal. No rashes or lesions Lymph nodes: Cervical, supraclavicular, and axillary nodes normal. No abnormal inguinal nodes palpated Neurologic: Grossly normal   Pelvic: External genitalia:  no lesions normal female              Urethra:  normal appearing urethra with no masses, tenderness or lesions              Bartholin's and Skene's: normal                 Vagina: normal appearing vagina with normal color and discharge, no lesions              Cervix: no cervical motion tenderness, no lesions and normal appearance              Pap taken: Yes.   Bimanual Exam:  Uterus:  anteverted, mild enlargement, non tender, history of fibroid              Adnexa: normal adnexa and no mass, fullness, tenderness               Rectovaginal: Confirms               Anus:  normal sphincter tone, no lesions  Chaperone present: yes  A:  Well Woman with normal exam  Menopausal no HRT  Newly diagnosed heart murmur with cardiology appointment pending  Big toe joint pain in past year treated by PCP  History of Bell's Palsy P:   Reviewed health and wellness pertinent to exam  Aware of need to advise if vaginal bleeding  Discussed importance of having evaluation for murmur.  Discuss with PCP shingles vaccine due to history of Bell's palsy.  Will continue to follow up if reoccurs or has issues similar to occurrence.  Pap smear: yes   counseled on breast self exam, mammography screening, adequate intake of calcium and vitamin D, diet and exercise  return annually or prn  An After Visit Summary was printed and given to the patient.

## 2018-01-25 LAB — CYTOLOGY - PAP
Diagnosis: NEGATIVE
HPV: NOT DETECTED

## 2018-02-07 ENCOUNTER — Telehealth: Payer: Self-pay | Admitting: Cardiology

## 2018-02-07 NOTE — Telephone Encounter (Signed)
LVM to call office back to R/S appt with Dr. Percival Spanish as there has been a change to 1/9 schedule.   She was also called on 12/3

## 2018-02-11 MED FILL — HYDROCHLOROTHIAZIDE 25 MG T: 25 | 90 days supply | Qty: 90 | Fill #1

## 2018-02-14 ENCOUNTER — Ambulatory Visit: Payer: 59 | Admitting: Cardiology

## 2018-02-22 ENCOUNTER — Other Ambulatory Visit: Payer: Self-pay | Admitting: Certified Nurse Midwife

## 2018-02-22 DIAGNOSIS — Z1231 Encounter for screening mammogram for malignant neoplasm of breast: Secondary | ICD-10-CM

## 2018-02-22 DIAGNOSIS — R921 Mammographic calcification found on diagnostic imaging of breast: Secondary | ICD-10-CM

## 2018-03-05 ENCOUNTER — Ambulatory Visit
Admission: RE | Admit: 2018-03-05 | Discharge: 2018-03-05 | Disposition: A | Discharge status: Home / Self Care | Payer: 59 | Source: Ambulatory Visit | Attending: Certified Nurse Midwife | Admitting: Certified Nurse Midwife

## 2018-03-05 DIAGNOSIS — R921 Mammographic calcification found on diagnostic imaging of breast: Secondary | ICD-10-CM | POA: Diagnosis not present

## 2018-03-11 MED FILL — LOSARTAN POTASSIUM 25 MG TA: 25 | 90 days supply | Qty: 90 | Fill #1 | Status: TO

## 2018-03-18 ENCOUNTER — Encounter: Payer: Self-pay | Admitting: Internal Medicine

## 2018-03-18 ENCOUNTER — Ambulatory Visit: Payer: 59 | Admitting: Internal Medicine

## 2018-03-18 ENCOUNTER — Ambulatory Visit (INDEPENDENT_AMBULATORY_CARE_PROVIDER_SITE_OTHER): Payer: 59 | Admitting: Internal Medicine

## 2018-03-18 VITALS — BP 160/90 | HR 112 | Ht 64.0 in | Wt 191.8 lb

## 2018-03-18 DIAGNOSIS — R011 Cardiac murmur, unspecified: Secondary | ICD-10-CM | POA: Diagnosis not present

## 2018-03-18 DIAGNOSIS — D509 Iron deficiency anemia, unspecified: Secondary | ICD-10-CM

## 2018-03-18 DIAGNOSIS — R079 Chest pain, unspecified: Secondary | ICD-10-CM | POA: Diagnosis not present

## 2018-03-18 DIAGNOSIS — E782 Mixed hyperlipidemia: Secondary | ICD-10-CM | POA: Diagnosis not present

## 2018-03-18 DIAGNOSIS — I10 Essential (primary) hypertension: Secondary | ICD-10-CM

## 2018-03-18 MED ORDER — ATORVASTATIN CALCIUM 10 MG PO TABS: 10.0000 mg | ORAL_TABLET | Freq: Every day | ORAL | Status: DC

## 2018-03-18 NOTE — Patient Instructions (Signed)
Medication Instructions:  RESUME Lipitor 10mg  daily  If you need a refill on your cardiac medications before your next appointment, please call your pharmacy.   Lab work: None ordered  Testing/Procedures: Your physician has requested that you have an echocardiogram. Echocardiography is a painless test that uses sound waves to create images of your heart. It provides your doctor with information about the size and shape of your heart and how well your heart's chambers and valves are working. This procedure takes approximately one hour. There are no restrictions for this procedure.  Your physician has requested that you have en exercise stress myoview. For further information please visit [email or web address]. Please follow instruction sheet, as given.    Follow-Up: At [facility], you and your health needs are our priority.  As part of our continuing mission to provide you with exceptional heart care, we have created designated Provider Care Teams.  These Care Teams include your primary Cardiologist (physician) and Advanced Practice Providers (APPs -  Physician Assistants and Nurse Practitioners) who all work together to provide you with the care you need, when you need it. You will need a follow up appointment in 6-8 weeks.     You may see Dr.Acharya or one of the following Advanced Practice Providers on your designated Care Team:   Rosaria Ferries, PA-C . Jory Sims, DNP, ANP

## 2018-03-28 ENCOUNTER — Ambulatory Visit (HOSPITAL_COMMUNITY): Payer: 59 | Attending: Cardiovascular Disease

## 2018-03-28 DIAGNOSIS — R011 Cardiac murmur, unspecified: Secondary | ICD-10-CM | POA: Insufficient documentation

## 2018-04-09 ENCOUNTER — Telehealth (HOSPITAL_COMMUNITY): Payer: Self-pay

## 2018-04-09 MED FILL — PANTOPRAZOLE SOD DR 40 MG T: 40 | 90 days supply | Qty: 90 | Fill #1 | Status: TO

## 2018-04-09 NOTE — Telephone Encounter (Signed)
Encounter complete. 

## 2018-04-11 ENCOUNTER — Ambulatory Visit (HOSPITAL_COMMUNITY)
Admission: RE | Admit: 2018-04-11 | Discharge: 2018-04-11 | Disposition: A | Discharge status: Home / Self Care | Payer: 59 | Source: Ambulatory Visit | Attending: Internal Medicine | Admitting: Internal Medicine

## 2018-04-11 ENCOUNTER — Encounter (HOSPITAL_COMMUNITY): Payer: Self-pay | Admitting: *Deleted

## 2018-04-11 DIAGNOSIS — R079 Chest pain, unspecified: Secondary | ICD-10-CM | POA: Insufficient documentation

## 2018-04-11 LAB — MYOCARDIAL PERFUSION IMAGING
CHL CUP MPHR: 166 {beats}/min
CSEPED: 7 min
CSEPPHR: 166 {beats}/min
Estimated workload: 7 METS
Exercise duration (sec): 1 s
LV dias vol: 68 mL (ref 46–106)
LV sys vol: 19 mL
Percent HR: 100 %
RPE: 17
Rest HR: 64 {beats}/min
SDS: 1
SRS: 2
SSS: 3
TID: 1.02

## 2018-04-11 MED ORDER — TECHNETIUM TC 99M TETROFOSMIN IV KIT
30.8000 | PACK | Freq: Once | INTRAVENOUS | Status: AC | PRN
  Administered 2018-04-11: 30.8 via INTRAVENOUS
  Filled 2018-04-11: qty 31

## 2018-04-11 MED ORDER — TECHNETIUM TC 99M TETROFOSMIN IV KIT
9.8000 | PACK | Freq: Once | INTRAVENOUS | Status: AC | PRN
  Administered 2018-04-11: 9.8 via INTRAVENOUS
  Filled 2018-04-11: qty 10

## 2018-04-11 NOTE — Progress Notes (Signed)
Dr Debara Pickett reviewed Myoview study. Ok d/c pt home.

## 2018-04-19 MED FILL — ATORVASTATIN 10 MG TABLET: 10 | 90 days supply | Qty: 90 | Fill #1

## 2018-04-29 ENCOUNTER — Telehealth: Payer: Self-pay

## 2018-04-29 NOTE — Telephone Encounter (Signed)
-----   Message from Elouise Munroe, MD sent at 04/29/2018  2:32 PM EDT ----- Low risk stress test, no signs of lack of blood flow to the heart.   Pls call patient and inquire if she would like to push follow up out until June (3 mo) in light of COVID-19 pandemic.  If she would like to speak by phone instead please let me know.

## 2018-04-29 NOTE — Telephone Encounter (Signed)
Called to give pt myoview results and to inquire of rescheduling her routine f/u. lmtcb

## 2018-05-06 ENCOUNTER — Telehealth: Payer: Self-pay | Admitting: *Deleted

## 2018-05-06 MED FILL — HYDROCHLOROTHIAZIDE 25 MG T: 25 | 90 days supply | Qty: 90 | Fill #2

## 2018-05-06 NOTE — Progress Notes (Signed)
Cardiology Office Note:    Date:  03/18/2018   ID:  [name], DOB [birth year], MRN [id number]  PCP:  Redmond School, MD  Cardiologist:  No primary care provider on file.  Electrophysiologist:  None   Referring MD: Redmond School, MD   Murmur  History of Present Illness:    [name] is a 53 y.o. female with a hx of iron deficiency anemia, current hemoglobin 13.6 g/dL in September 2019, celiac's disease, hypertension, hyperlipidemia who presents today for a systolic murmur.    She endorses occasional symptoms of chest discomfort with activity.  It is not provoked by deep inspiration.  She also notes palpitations that occur approximately 2-3 times a month, that are not particularly bothersome.  I have independently reviewed his CT scan performed for the indication of pulmonary embolism in 2015 which demonstrates three-vessel coronary artery calcifications.    She is a former smoker, 15-pack-year, and has approximately 6-8 alcoholic beverages in a week.  Her family history is significant for a brother who had a two-vessel CABG at age 1, and her father who had an MI in his 58s, and also had a AAA.  The patient denies dyspnea at rest or with exertion, PND, orthopnea, or leg swelling. Denies syncope or presyncope. Denies dizziness or lightheadedness. Denies snoring and has not been evaluated for sleep apnea.  Epworth Sleepiness Scale score 2. Past Medical History:  Diagnosis Date  . Anemia    iron def anemia, iron infusion  . Bell's palsy   . Celiac disease   . Fibroid   . GERD (gastroesophageal reflux disease)   . History of pneumonia   . Hypertension   . Iron deficiency anemia   . Menorrhagia   . Psoriasis   . STD (sexually transmitted disease)    HSV1  . Vertigo     Past Surgical History:  Procedure Laterality Date  . APPENDECTOMY      53 years old    Current Medications: Current Meds  Medication Sig  . ALPRAZolam (XANAX) 0.25 MG tablet   . Apremilast  (OTEZLA) 30 MG TABS Take 1 tablet by mouth daily.  Marland Kitchen atorvastatin (LIPITOR) 10 MG tablet   . atorvastatin (LIPITOR) 10 MG tablet Take 1 tablet (10 mg total) by mouth daily.  . Calcium Carbonate-Vitamin D (CALCIUM + D) 600-200 MG-UNIT TABS Take 1 tablet by mouth as needed.   . Coenzyme Q10 (CO Q 10 PO) Take by mouth daily.  . hydrochlorothiazide (HYDRODIURIL) 25 MG tablet Take 25 mg by mouth daily.  Marland Kitchen KRILL OIL PO Take by mouth daily.  Marland Kitchen losartan (COZAAR) 25 MG tablet Take 25 mg by mouth daily.  . Multiple Vitamins-Minerals (MULTIVITAMIN PO) Take by mouth daily.  . pantoprazole (PROTONIX) 40 MG tablet Take 40 mg by mouth daily.     Allergies:   Augmentin [amoxicillin-pot clavulanate]; Erythromycin; and Bactrim [sulfamethoxazole-trimethoprim]   Social History   Socioeconomic History  . Marital status: Single    Spouse name: Not on file  . Number of children: Not on file  . Years of education: Not on file  . Highest education level: Not on file  Occupational History  . Not on file  Social Needs  . Financial resource strain: Not on file  . Food insecurity:    Worry: Not on file    Inability: Not on file  . Transportation needs:    Medical: Not on file    Non-medical: Not on file  Tobacco Use  .  Smoking status: Former Research scientist (life sciences)  . Smokeless tobacco: Never Used  Substance and Sexual Activity  . Alcohol use: Yes    Alcohol/week: 6.0 - 8.0 standard drinks    Types: 6 - 8 Standard drinks or equivalent per week  . Drug use: No  . Sexual activity: Not Currently    Partners: Male    Birth control/protection: Post-menopausal  Lifestyle  . Physical activity:    Days per week: Not on file    Minutes per session: Not on file  . Stress: Not on file  Relationships  . Social connections:    Talks on phone: Not on file    Gets together: Not on file    Attends religious service: Not on file    Active member of club or organization: Not on file    Attends meetings of clubs or  organizations: Not on file    Relationship status: Not on file  Other Topics Concern  . Not on file  Social History Narrative  . Not on file     Family History: The patient's family history includes Cancer in her brother and mother; Heart attack in her brother; Heart failure in her father; Stroke in her sister.  ROS:   Please see the history of present illness.    All other systems reviewed and are negative.  EKGs/Labs/Other Studies Reviewed:    The following studies were reviewed today:  EKG: Normal sinus rhythm, ventricular rate 69 bpm  Recent Labs: No results found for requested labs within last 8760 hours.  Recent Lipid Panel No results found for: CHOL, TRIG, HDL, CHOLHDL, VLDL, LDLCALC, LDLDIRECT  Physical Exam:    VS:  BP (!) 160/90   Pulse (!) 112   Ht 5' 4"  (1.626 m)   Wt 191 lb 12.8 oz (87 kg)   LMP 03/01/2013   BMI 32.92 kg/m     Wt Readings from Last 3 Encounters:  04/11/18 191 lb (86.6 kg)  03/18/18 191 lb 12.8 oz (87 kg)  01/22/18 188 lb (85.3 kg)     Constitutional: No acute distress Eyes: pupils equally round and reactive to light, sclera non-icteric, normal conjunctiva and lids ENMT: normal dentition, moist mucous membranes Cardiovascular: regular rhythm, normal rate, 2/6 SEM. S1 and S2 normal. Radial pulses normal bilaterally. No jugular venous distention.  Respiratory: clear to auscultation bilaterally GI : normal bowel sounds, soft and nontender. No distention.   MSK: extremities warm, well perfused. No edema.  NEURO: grossly nonfocal exam, moves all extremities. PSYCH: alert and oriented x 3, normal mood and affect.   ASSESSMENT:    1. Systolic murmur   2. Chest pain, unspecified type    PLAN:    She does indeed have a systolic murmur, and we will evaluate this with an echocardiogram to ensure that she does not have a bicuspid aortic valve given her young age.  I do not hear a systolic click on exam.  It does not sound characteristic of  mitral valve regurgitation.  Fortunately she is not having significant shortness of breath with exertion, so I am hopeful that this is not a flow-limiting lesion.  She is endorsing chest discomfort with activity and it would be best to obtain a treadmill study.  It can be converted to a pharmacologic study if necessary if she is unable to complete max exercise.  For hypertension she is currently taking hydrochlorothiazide 25 mg daily and losartan 25 mg daily.  Her blood pressure is elevated on today's exam, on  a retake it is 150/90 mmHg.  Pending the results of her testing she may need up titration of her therapy.  Would likely recommend increasing the dose of losartan to 50 mg daily if she has an unremarkable stress test and echocardiogram.  For hyperlipidemia would recommend that she take atorvastatin and we can start with a dose of 10 mg daily she notes that she has some joint pain with statins, we will trial this and discuss alternate therapy if required.   TIME SPENT WITH PATIENT: 40 minutes of direct patient care. More than 50% of that time was spent on coordination of care and counseling regarding testing recommendations, diet and lifestyle modifications for optimal health, and medication therapy.  Cherlynn Kaiser, MD   [facility]   Medication Adjustments/Labs and Tests Ordered: Current medicines are reviewed at length with the patient today.  Concerns regarding medicines are outlined above.  Orders Placed This Encounter  Procedures  . Myocardial Perfusion Imaging  . EKG 12-Lead  . ECHOCARDIOGRAM COMPLETE   Meds ordered this encounter  Medications  . atorvastatin (LIPITOR) 10 MG tablet    Sig: Take 1 tablet (10 mg total) by mouth daily.    Patient Instructions  Medication Instructions:  RESUME Lipitor 38m daily  If you need a refill on your cardiac medications before your next appointment, please call your pharmacy.   Lab work: None ordered   Testing/Procedures: Your physician has requested that you have an echocardiogram. Echocardiography is a painless test that uses sound waves to create images of your heart. It provides your doctor with information about the size and shape of your heart and how well your heart's chambers and valves are working. This procedure takes approximately one hour. There are no restrictions for this procedure.  Your physician has requested that you have en exercise stress myoview. For further information please visit [email or web address] Please follow instruction sheet, as given.    Follow-Up: [facility] and your [facility] needs are our priority.  As part of our continuing mission to provide you with exceptional heart care, we have created designated Provider Care Teams.  These Care Teams include your primary Cardiologist (physician) and Advanced Practice Providers (APPs -  Physician Assistants and Nurse Practitioners) who all work together to provide you with the care you need, when you need it. You will need a follow up appointment in 6-8 weeks.     You may see Dr.Pistol Kessenich or one of the following Advanced Practice Providers on your designated Care Team:   RRosaria Ferries PA-C . KJory Sims DNP, ANP

## 2018-05-06 NOTE — Telephone Encounter (Signed)
LEFT MESSAGE TO CALL BACK NEED RESCHEDULE  APPT FOR 3 MONTHS OUT  PER TEST RESULT    ----- Message from Elouise Munroe, MD sent at 04/29/2018  2:32 PM EDT ----- Low risk stress test, no signs of lack of blood flow to the heart.   Pls call patient and inquire if she would like to push follow up out until June (3 mo) in light of COVID-19 pandemic.  If she would like to speak by phone instead please

## 2018-05-09 ENCOUNTER — Ambulatory Visit: Payer: 59 | Admitting: Internal Medicine

## 2018-05-13 MED FILL — ALBUTEROL SULFATE HFA 108 (: 108 (90 BAS | 25 days supply | Qty: 18 | Fill #0

## 2018-06-01 ENCOUNTER — Telehealth: Payer: 59 | Admitting: Nurse Practitioner

## 2018-06-01 DIAGNOSIS — T560X1A Toxic effect of lead and its compounds, accidental (unintentional), initial encounter: Secondary | ICD-10-CM

## 2018-06-01 DIAGNOSIS — M10179 Lead-induced gout, unspecified ankle and foot: Secondary | ICD-10-CM | POA: Diagnosis not present

## 2018-06-01 MED ORDER — PREDNISONE 10 MG (21) PO TBPK: ORAL_TABLET | ORAL | 0 refills | Status: DC

## 2018-06-01 MED FILL — predniSONE 10 MG (21) TBPK: 10 | 6 days supply | Qty: 21 | Fill #0

## 2018-06-01 NOTE — Progress Notes (Signed)
We are sorry that you are not feeling well.  Here is how we plan to help!  Based on what you have shared with me it looks like you mostly have gout.  Gout is the deposit of uric crystals causing pain and inflammation, most commonly affecting the great toe.  I have prescribed prednisone dose pack to be taken as directed. The usual treatment for gout os colchicine but sinc I can not see any recent lab results, it is not safe to rx without knowing your creatine levels. The creatine levels let me know how healthy your kidneys are. Therefore he steroids should help.  Home Care  Stay active.  Start with short walks on flat ground if you can.  Try to walk farther each day.  Only take medicines as told by your doctor.  Put ice on the  area.  Put ice in a plastic bag  Place a towel between your skin and the bag  Leave the ice on for 15-20 minutes, 3-4 times a day for the first 2-3 days. 210 After that, you can switch between ice and heat packs.  Ask your doctor about back exercises or massage.  Avoid feeling anxious or stressed.  Find good ways to deal with stress, such as exercise.  Get Help Right Way If:  Your pain does not go away with rest or medicine.  Your pain does not go away in 1 week.  You have new problems.  You do not feel well.  The pain spreads into your legs.  You cannot control when you poop (bowel movement) or pee (urinate)  You feel sick to your stomach (nauseous) or throw up (vomit)  You have belly (abdominal) pain.  You feel like you may pass out (faint).  If you develop a fever.  Make Sure you:  Understand these instructions.  Will watch your condition  Will get help right away if you are not doing well or get worse.  Your e-visit answers were reviewed by a board certified advanced clinical practitioner to complete your personal care plan.  Depending on the condition, your plan could have included both over the counter or prescription  medications.  If there is a problem please reply  once you have received a response from your provider.  Your safety is important to [place].  If you have drug allergies check your prescription carefully.    You can use MyChart to ask questions about today's visit, request a non-urgent call back, or ask for a work or school excuse for 24 hours related to this e-Visit. If it has been greater than 24 hours you will need to follow up with your provider, or enter a new e-Visit to address those concerns.  You will get an e-mail in the next two days asking about your experience.  I hope that your e-visit has been valuable and will speed your recovery. Thank you for using e-visits.  5-10 minutes spent reviewing and documenting in chart.

## 2018-06-12 MED FILL — LOSARTAN POTASSIUM 25 MG TA: 25 | 90 days supply | Qty: 90 | Fill #0

## 2018-07-03 MED FILL — ALBUTEROL SULFATE HFA 108 (: 108 (90 BAS | 25 days supply | Qty: 18 | Fill #1

## 2018-07-03 MED FILL — PANTOPRAZOLE SOD DR 40 MG T: 40 | 90 days supply | Qty: 90 | Fill #0

## 2018-07-04 ENCOUNTER — Telehealth: Payer: Self-pay | Admitting: Internal Medicine

## 2018-07-04 NOTE — Telephone Encounter (Signed)
lmtcb - patient needs to schedule 3 month fu with Dr. Nobie Putnam Please schedule in June if possible -

## 2018-07-12 ENCOUNTER — Other Ambulatory Visit (HOSPITAL_COMMUNITY): Payer: Self-pay | Admitting: Internal Medicine

## 2018-07-12 ENCOUNTER — Ambulatory Visit (HOSPITAL_COMMUNITY)
Admission: RE | Admit: 2018-07-12 | Discharge: 2018-07-12 | Disposition: A | Discharge status: Home / Self Care | Payer: 59 | Source: Ambulatory Visit | Attending: Internal Medicine | Admitting: Internal Medicine

## 2018-07-12 ENCOUNTER — Other Ambulatory Visit: Payer: Self-pay

## 2018-07-12 DIAGNOSIS — L409 Psoriasis, unspecified: Secondary | ICD-10-CM | POA: Diagnosis not present

## 2018-07-12 DIAGNOSIS — M1A00X0 Idiopathic chronic gout, unspecified site, without tophus (tophi): Secondary | ICD-10-CM | POA: Diagnosis not present

## 2018-07-12 DIAGNOSIS — M549 Dorsalgia, unspecified: Secondary | ICD-10-CM | POA: Diagnosis not present

## 2018-07-12 DIAGNOSIS — Z1389 Encounter for screening for other disorder: Secondary | ICD-10-CM | POA: Diagnosis not present

## 2018-07-12 DIAGNOSIS — M2550 Pain in unspecified joint: Secondary | ICD-10-CM | POA: Diagnosis not present

## 2018-07-12 DIAGNOSIS — M545 Low back pain: Secondary | ICD-10-CM | POA: Diagnosis not present

## 2018-07-12 DIAGNOSIS — E6609 Other obesity due to excess calories: Secondary | ICD-10-CM | POA: Diagnosis not present

## 2018-07-12 DIAGNOSIS — Z6831 Body mass index (BMI) 31.0-31.9, adult: Secondary | ICD-10-CM | POA: Diagnosis not present

## 2018-07-12 DIAGNOSIS — I10 Essential (primary) hypertension: Secondary | ICD-10-CM | POA: Diagnosis not present

## 2018-07-12 DIAGNOSIS — E785 Hyperlipidemia, unspecified: Secondary | ICD-10-CM | POA: Diagnosis not present

## 2018-07-12 MED FILL — CIPROFLOXACIN HCL 500 MG TA: 500 | 7 days supply | Qty: 14 | Fill #0

## 2018-07-17 MED FILL — ALLOPURINOL 300 MG TABLET: 300 | 90 days supply | Qty: 90 | Fill #0

## 2018-07-17 MED FILL — LOSARTAN POTASSIUM 50 MG TA: 50 | 90 days supply | Qty: 180 | Fill #0

## 2018-07-17 MED FILL — ATORVASTATIN 40 MG TABLET: 40 | 90 days supply | Qty: 90 | Fill #0

## 2018-07-18 DIAGNOSIS — L400 Psoriasis vulgaris: Secondary | ICD-10-CM | POA: Diagnosis not present

## 2018-07-18 MED FILL — BETAMETHASONE DP 0.05% CRM: 0.05 | 20 days supply | Qty: 45 | Fill #0

## 2018-07-22 ENCOUNTER — Ambulatory Visit (INDEPENDENT_AMBULATORY_CARE_PROVIDER_SITE_OTHER): Payer: 59 | Admitting: Pharmacist

## 2018-07-22 ENCOUNTER — Encounter: Payer: Self-pay | Admitting: Pharmacist

## 2018-07-22 ENCOUNTER — Other Ambulatory Visit: Payer: Self-pay

## 2018-07-22 DIAGNOSIS — Z79899 Other long term (current) drug therapy: Secondary | ICD-10-CM

## 2018-07-22 MED ORDER — OTEZLA 30 MG PO TABS: 1.0000 | ORAL_TABLET | Freq: Two times a day (BID) | ORAL | 4 refills | Status: DC

## 2018-07-22 NOTE — Progress Notes (Signed)
   S: Patient presents today to the [facility] for review of her medication.   Patient is currently taking Otezla for psoriasis. Patient is managed by Dr. Ubaldo Glassing for this.   Adherence: denies any missed doses. She restarted the medication in January and used samples when she restarted. She was off of [place] for a while because they thought she was having a reaction to it but they found out she was reacting to a topical treatment.   Dose: 30 mg PO BID  Efficacy: reports that it is working well again and works better than other options for her.   Monitoring: - GI Upset: had some when she started the [place] but it was minor and has resolved. - Headache: denies - Weight loss: denies - S/sx of infection: denies - Mood changes: denies   O:     Lab Results  Component Value Date   WBC 7.8 03/01/2013   HGB 16.0 (H) 03/01/2013   HCT 48.1 (H) 03/01/2013   MCV 88.3 03/01/2013   PLT 217 03/01/2013      Chemistry      Component Value Date/Time   NA 143 03/01/2013 1650   K 3.5 (L) 03/01/2013 1650   CL 100 03/01/2013 1650   CO2 22 03/01/2013 1650   BUN 14 03/01/2013 1650   CREATININE 0.90 03/01/2013 1650      Component Value Date/Time   CALCIUM 9.6 03/01/2013 1650   ALKPHOS 86 03/01/2013 1650   AST 24 03/01/2013 1650   ALT 24 03/01/2013 1650   BILITOT 0.8 03/01/2013 1650       A/P: 1. Medication review: patient currently on Otezla for psoriasis and is tolerating it well with no adverse effects and improved control of her psoriasis. Reviewed the medication with her, including how the medication works, possible adverse effects, and the need for regular follow up with her dermatologist. Patient aware of possible adverse effects such as GI upset, mood changes (depression), and weight loss. No recommendations for any changes.    Christella Hartigan, PharmD, BCPS, BCACP, CPP Clinical Pharmacist Practitioner  [phone]

## 2018-07-23 MED FILL — OTEZLA 30 MG TABS: 30 | 30 days supply | Qty: 60 | Fill #0

## 2018-07-31 ENCOUNTER — Telehealth: Payer: Self-pay | Admitting: Internal Medicine

## 2018-07-31 NOTE — Telephone Encounter (Signed)
Left message for patient to call back.  She is due for 3 month follow up but may not wish to schedule.

## 2018-08-02 ENCOUNTER — Other Ambulatory Visit (HOSPITAL_COMMUNITY): Payer: Self-pay | Admitting: Internal Medicine

## 2018-08-02 ENCOUNTER — Other Ambulatory Visit: Payer: Self-pay | Admitting: Internal Medicine

## 2018-08-02 DIAGNOSIS — R945 Abnormal results of liver function studies: Secondary | ICD-10-CM

## 2018-08-15 ENCOUNTER — Ambulatory Visit (HOSPITAL_COMMUNITY)
Admission: RE | Admit: 2018-08-15 | Discharge: 2018-08-15 | Disposition: A | Discharge status: Home / Self Care | Payer: 59 | Source: Ambulatory Visit | Attending: Internal Medicine | Admitting: Internal Medicine

## 2018-08-15 ENCOUNTER — Other Ambulatory Visit: Payer: Self-pay

## 2018-08-15 DIAGNOSIS — R945 Abnormal results of liver function studies: Secondary | ICD-10-CM

## 2018-08-15 DIAGNOSIS — R7989 Other specified abnormal findings of blood chemistry: Secondary | ICD-10-CM | POA: Diagnosis not present

## 2018-08-20 MED FILL — HYDROCHLOROTHIAZIDE 25 MG T: 25 | 90 days supply | Qty: 90 | Fill #3

## 2018-08-20 MED FILL — OTEZLA 30 MG TABS: 30 | 30 days supply | Qty: 60 | Fill #1

## 2018-08-29 DIAGNOSIS — R7301 Impaired fasting glucose: Secondary | ICD-10-CM | POA: Diagnosis not present

## 2018-08-29 DIAGNOSIS — R7309 Other abnormal glucose: Secondary | ICD-10-CM | POA: Diagnosis not present

## 2018-09-23 MED FILL — OTEZLA 30 MG TABS: 30 | 30 days supply | Qty: 60 | Fill #2

## 2018-10-07 MED FILL — ALLOPURINOL 300 MG TABLET: 300 | 90 days supply | Qty: 90 | Fill #1

## 2018-10-14 MED FILL — PANTOPRAZOLE SOD DR 40 MG T: 40 | 90 days supply | Qty: 90 | Fill #1

## 2018-10-14 MED FILL — ATORVASTATIN 40 MG TABLET: 40 | 90 days supply | Qty: 90 | Fill #1

## 2018-10-22 DIAGNOSIS — K625 Hemorrhage of anus and rectum: Secondary | ICD-10-CM | POA: Diagnosis not present

## 2018-10-22 DIAGNOSIS — K219 Gastro-esophageal reflux disease without esophagitis: Secondary | ICD-10-CM | POA: Diagnosis not present

## 2018-10-22 DIAGNOSIS — K600 Acute anal fissure: Secondary | ICD-10-CM | POA: Diagnosis not present

## 2018-10-22 DIAGNOSIS — K900 Celiac disease: Secondary | ICD-10-CM | POA: Diagnosis not present

## 2018-10-22 DIAGNOSIS — K52832 Lymphocytic colitis: Secondary | ICD-10-CM | POA: Diagnosis not present

## 2018-10-22 MED FILL — OTEZLA 30 MG TABS: 30 | 30 days supply | Qty: 60 | Fill #3

## 2018-10-24 MED FILL — FUROSEMIDE 20 MG TAB: 20 | 90 days supply | Qty: 90 | Fill #0

## 2018-10-24 MED FILL — BALSALAZIDE DISODIUM 750 MG: 750 | 30 days supply | Qty: 180 | Fill #0

## 2018-11-07 ENCOUNTER — Encounter: Payer: Self-pay | Admitting: Family Medicine

## 2018-11-07 ENCOUNTER — Ambulatory Visit (INDEPENDENT_AMBULATORY_CARE_PROVIDER_SITE_OTHER): Payer: 59 | Admitting: Family Medicine

## 2018-11-07 ENCOUNTER — Other Ambulatory Visit: Payer: Self-pay

## 2018-11-07 ENCOUNTER — Ambulatory Visit: Payer: Self-pay

## 2018-11-07 VITALS — BP 130/85 | HR 76 | Ht 64.0 in

## 2018-11-07 DIAGNOSIS — M25522 Pain in left elbow: Secondary | ICD-10-CM

## 2018-11-07 MED ORDER — COLCHICINE 0.6 MG PO TABS: 0.6000 mg | ORAL_TABLET | Freq: Two times a day (BID) | ORAL | 2 refills | Status: DC

## 2018-11-07 MED FILL — COLCHICINE 0.6 MG TABS: 0.6 | 30 days supply | Qty: 60 | Fill #0

## 2018-11-07 NOTE — Patient Instructions (Signed)
Nice to meet you Please take the colchicine until you are symptom free for 2 days.  I would hold the statin or decreased it from every day to every other day.   Please send me a message in MyChart with any questions or updates.  Please see me back in 2-3 weeks.   --Dr. Raeford Razor

## 2018-11-07 NOTE — Assessment & Plan Note (Signed)
Has trouble with full extension which would suggest more of a joint issue.  Does not appear to be infectious.  Did get started on allopurinol for elevated uric acid.  Would suggest more of a gouty source of her problem. -Colchicine. -Counseled on supportive care. -May need to consider imaging or an injection.

## 2018-11-07 NOTE — Progress Notes (Signed)
[name] - 53 y.o. female MRN [id number]  Date of birth: Jun 25, 1965  SUBJECTIVE:  Including CC & ROS.  Chief Complaint  Patient presents with  . Elbow Pain    left elbow x 3 months    [name] is a 53 y.o. female that is presenting with left elbow pain.  It has been ongoing for a couple of months.  She is tried ibuprofen with no relief.  She was started on allopurinol for having elevated uric acid.  She notices it with certain movements such as curling.  She cannot fully extend her elbow at times.  The pain is intermittent in nature.  It can be moderate to severe at times.  Seems to be localized to the elbow.  Denies any injury or inciting event.  No numbness or tingling   Review of Systems  Constitutional: Negative for fever.  HENT: Negative for congestion.   Respiratory: Negative for cough.   Cardiovascular: Negative for chest pain.  Gastrointestinal: Negative for abdominal pain.  Musculoskeletal: Positive for joint swelling.  Skin: Negative for color change.  Neurological: Negative for tremors.  Hematological: Negative for adenopathy.    HISTORY: Past Medical, Surgical, Social, and Family History Reviewed & Updated per EMR.   Pertinent Historical Findings include:  Past Medical History:  Diagnosis Date  . Anemia    iron def anemia, iron infusion  . Bell's palsy   . Celiac disease   . Fibroid   . GERD (gastroesophageal reflux disease)   . History of pneumonia   . Hypertension   . Iron deficiency anemia   . Menorrhagia   . Psoriasis   . STD (sexually transmitted disease)    HSV1  . Vertigo     Past Surgical History:  Procedure Laterality Date  . APPENDECTOMY      53 years old      Family History  Problem Relation Age of Onset  . Cancer Mother        lung  . Cancer Brother        testicular  . Heart attack Brother   . Heart failure Father   . Stroke Sister      Social History   Socioeconomic History  . Marital status: Single    Spouse  name: Not on file  . Number of children: Not on file  . Years of education: Not on file  . Highest education level: Not on file  Occupational History  . Not on file  Social Needs  . Financial resource strain: Not on file  . Food insecurity    Worry: Not on file    Inability: Not on file  . Transportation needs    Medical: Not on file    Non-medical: Not on file  Tobacco Use  . Smoking status: Former Research scientist (life sciences)  . Smokeless tobacco: Never Used  Substance and Sexual Activity  . Alcohol use: Yes    Alcohol/week: 6.0 - 8.0 standard drinks    Types: 6 - 8 Standard drinks or equivalent per week  . Drug use: No  . Sexual activity: Not Currently    Partners: Male    Birth control/protection: Post-menopausal  Lifestyle  . Physical activity    Days per week: Not on file    Minutes per session: Not on file  . Stress: Not on file  Relationships  . Social Herbalist on phone: Not on file    Gets together: Not on file  Attends religious service: Not on file    Active member of club or organization: Not on file    Attends meetings of clubs or organizations: Not on file    Relationship status: Not on file  . Intimate partner violence    Fear of current or ex partner: Not on file    Emotionally abused: Not on file    Physically abused: Not on file    Forced sexual activity: Not on file  Other Topics Concern  . Not on file  Social History Narrative  . Not on file     PHYSICAL EXAM:  VS: BP 130/85   Pulse 76   Ht 5' 4"  (1.626 m)   LMP 03/01/2013   BMI 32.79 kg/m  Physical Exam Gen: NAD, alert, cooperative with exam, well-appearing ENT: normal lips, normal nasal mucosa,  Eye: normal EOM, normal conjunctiva and lids CV:  no edema, +2 pedal pulses   Resp: no accessory muscle use, non-labored,  GI: no masses or tenderness, no hernia  Skin: no rashes, no areas of induration  Neuro: normal tone, normal sensation to touch Psych:  normal insight, alert and oriented  MSK:  Left elbow: Lack of complete extension. Normal flexion. Some tenderness over the lateral epicondyle. No pain with resistance to middle finger extension. Normal resistance to thumb extension. Normal pincer grasp. Normal grip strength. Neurovascular intact  Limited ultrasound: Left elbow, left foot:  Normal-appearing lateral epicondyle.  No increased vascularity in this area. There appears to be a mild effusion in the posterior joint. There also appears to be a double lumen line over the radial head which could suggest gout.  Left foot: There appears to be a hyperechoic density within the first MTP joint.  This could represent gout.  Summary: Findings would suggest a gouty source of her pain.  Ultrasound and interpretation by Clearance Coots, MD      ASSESSMENT & PLAN:   Left elbow pain Has trouble with full extension which would suggest more of a joint issue.  Does not appear to be infectious.  Did get started on allopurinol for elevated uric acid.  Would suggest more of a gouty source of her problem. -Colchicine. -Counseled on supportive care. -May need to consider imaging or an injection.

## 2018-11-21 DIAGNOSIS — K601 Chronic anal fissure: Secondary | ICD-10-CM | POA: Diagnosis not present

## 2018-11-21 DIAGNOSIS — K52832 Lymphocytic colitis: Secondary | ICD-10-CM | POA: Diagnosis not present

## 2018-11-21 DIAGNOSIS — K219 Gastro-esophageal reflux disease without esophagitis: Secondary | ICD-10-CM | POA: Diagnosis not present

## 2018-11-21 DIAGNOSIS — K625 Hemorrhage of anus and rectum: Secondary | ICD-10-CM | POA: Diagnosis not present

## 2018-11-26 ENCOUNTER — Encounter: Payer: Self-pay | Admitting: Family Medicine

## 2018-11-26 ENCOUNTER — Other Ambulatory Visit: Payer: Self-pay

## 2018-11-26 ENCOUNTER — Ambulatory Visit (HOSPITAL_BASED_OUTPATIENT_CLINIC_OR_DEPARTMENT_OTHER)
Admission: RE | Admit: 2018-11-26 | Discharge: 2018-11-26 | Disposition: A | Discharge status: Home / Self Care | Payer: 59 | Source: Ambulatory Visit | Attending: Family Medicine | Admitting: Family Medicine

## 2018-11-26 ENCOUNTER — Ambulatory Visit (INDEPENDENT_AMBULATORY_CARE_PROVIDER_SITE_OTHER): Payer: 59 | Admitting: Family Medicine

## 2018-11-26 VITALS — Ht 64.0 in

## 2018-11-26 DIAGNOSIS — M25522 Pain in left elbow: Secondary | ICD-10-CM | POA: Diagnosis not present

## 2018-11-26 MED ORDER — PREDNISONE 5 MG PO TABS: ORAL_TABLET | ORAL | 0 refills | Status: DC

## 2018-11-26 MED FILL — OTEZLA 30 MG TABS: 30 | 30 days supply | Qty: 60 | Fill #4

## 2018-11-26 MED FILL — predniSONE 5 MG TABS: 5 | 6 days supply | Qty: 21 | Fill #0

## 2018-11-26 NOTE — Patient Instructions (Signed)
Good to see you Please take the prednisone. You can take one rayos at night after completing the prednisone. You can ask your dermatologist about its use as well.  I will call you about the results.  I'll wait to get your lab work and if we need to order more  Please send me a message in [place] with any questions or updates.  Please see me back in 4 weeks.   --Dr. Raeford Razor

## 2018-11-26 NOTE — Progress Notes (Signed)
[name] - 53 y.o. female MRN [id number]  Date of birth: Mar 24, 1965  SUBJECTIVE:  Including CC & ROS.  Chief Complaint  Patient presents with  . Follow-up    follow up for left elbow    [name] is a 53 y.o. female that is following up for her left elbow pain.  She received colchicine on 10/1 and did not have any improvement of her symptoms.  She did have an elevated uric acid a few months ago and was started on allopurinol.  She has not had any improvement since being on allopurinol.  The pain is worse when she is in full extension or full flexion.  Denies any redness or swelling.  She denies any repetitive motions.  No history of trauma to the elbow.  She does have a history of psoriasis and celiac's disease.   Review of Systems  Constitutional: Negative for fever.  HENT: Negative for congestion.   Respiratory: Negative for cough.   Cardiovascular: Negative for chest pain.  Gastrointestinal: Negative for abdominal pain.  Musculoskeletal: Negative for gait problem.  Skin: Negative for color change.  Neurological: Negative for weakness.  Hematological: Negative for adenopathy.    HISTORY: Past Medical, Surgical, Social, and Family History Reviewed & Updated per EMR.   Pertinent Historical Findings include:  Past Medical History:  Diagnosis Date  . Anemia    iron def anemia, iron infusion  . Bell's palsy   . Celiac disease   . Fibroid   . GERD (gastroesophageal reflux disease)   . History of pneumonia   . Hypertension   . Iron deficiency anemia   . Menorrhagia   . Psoriasis   . STD (sexually transmitted disease)    HSV1  . Vertigo     Past Surgical History:  Procedure Laterality Date  . APPENDECTOMY      53 years old    Allergies  Allergen Reactions  . Augmentin [Amoxicillin-Pot Clavulanate]     **Patient repots she can safely take Amoxicillin  . Erythromycin Other (See Comments)    Sensitivity/stomach upset. Can take Zithromax without problems  .  Bactrim [Sulfamethoxazole-Trimethoprim] Rash    Family History  Problem Relation Age of Onset  . Cancer Mother        lung  . Cancer Brother        testicular  . Heart attack Brother   . Heart failure Father   . Stroke Sister      Social History   Socioeconomic History  . Marital status: Single    Spouse name: Not on file  . Number of children: Not on file  . Years of education: Not on file  . Highest education level: Not on file  Occupational History  . Not on file  Social Needs  . Financial resource strain: Not on file  . Food insecurity    Worry: Not on file    Inability: Not on file  . Transportation needs    Medical: Not on file    Non-medical: Not on file  Tobacco Use  . Smoking status: Former Research scientist (life sciences)  . Smokeless tobacco: Never Used  Substance and Sexual Activity  . Alcohol use: Yes    Alcohol/week: 6.0 - 8.0 standard drinks    Types: 6 - 8 Standard drinks or equivalent per week  . Drug use: No  . Sexual activity: Not Currently    Partners: Male    Birth control/protection: Post-menopausal  Lifestyle  . Physical activity  Days per week: Not on file    Minutes per session: Not on file  . Stress: Not on file  Relationships  . Social Herbalist on phone: Not on file    Gets together: Not on file    Attends religious service: Not on file    Active member of club or organization: Not on file    Attends meetings of clubs or organizations: Not on file    Relationship status: Not on file  . Intimate partner violence    Fear of current or ex partner: Not on file    Emotionally abused: Not on file    Physically abused: Not on file    Forced sexual activity: Not on file  Other Topics Concern  . Not on file  Social History Narrative  . Not on file     PHYSICAL EXAM:  VS: Ht 5' 4"  (1.626 m)   LMP 03/01/2013   BMI 32.79 kg/m  Physical Exam Gen: NAD, alert, cooperative with exam, well-appearing ENT: normal lips, normal nasal mucosa,   Eye: normal EOM, normal conjunctiva and lids CV:  no edema, +2 pedal pulses   Resp: no accessory muscle use, non-labored,  Skin: no rashes, no areas of induration  Neuro: normal tone, normal sensation to touch Psych:  normal insight, alert and oriented MSK:  Left elbow: No obvious swelling or ecchymosis. Limited flexion and limited full extension. No significant tenderness over the lateral epicondyle. Some tenderness to palpation of the joint line. Normal strength resistance. Neurovascularly intact     ASSESSMENT & PLAN:   Left elbow pain No improvement with the colchicine.  She did have an elevated uric acid.  She has history of psoriasis as well as celiac's disease.  Unclear if her symptoms may be associated with psoriatic arthritis or other form of autoimmune disease.  Reports her primary care performed a rheumatologic work-up which was negative at that point. -Prednisone. - provided rayos samples. May consider taking this more chronically.  -X-ray. -If no improvement will consider injection or further lab work up.

## 2018-11-26 NOTE — Assessment & Plan Note (Signed)
No improvement with the colchicine.  She did have an elevated uric acid.  She has history of psoriasis as well as celiac's disease.  Unclear if her symptoms may be associated with psoriatic arthritis or other form of autoimmune disease.  Reports her primary care performed a rheumatologic work-up which was negative at that point. -Prednisone. - provided rayos samples. May consider taking this more chronically.  -X-ray. -If no improvement will consider injection or further lab work up.

## 2018-11-27 ENCOUNTER — Telehealth: Payer: Self-pay | Admitting: Family Medicine

## 2018-11-27 NOTE — Telephone Encounter (Signed)
Left VM for patient. If she calls back please have her speak with a nurse/CMA and inform that her xrays are normal.  Her pain may be related to psoriatic arthritis.  Not likely to be degenerative in nature or gout.  We can consider further lab testing for other autoimmune problems or consider rheumatology referral.   If any questions then please take the best time and phone number to call and I will try to call her back.   Rosemarie Ax, MD [facility] 11/27/2018, 11:15 AM

## 2018-11-27 NOTE — Telephone Encounter (Signed)
Patient came into the office to drop off previous lab work. She was informed of xray results. Did not have any questions at the time.

## 2018-12-23 ENCOUNTER — Encounter: Payer: Self-pay | Admitting: Family Medicine

## 2018-12-23 ENCOUNTER — Other Ambulatory Visit: Payer: Self-pay

## 2018-12-23 ENCOUNTER — Ambulatory Visit (INDEPENDENT_AMBULATORY_CARE_PROVIDER_SITE_OTHER): Payer: 59 | Admitting: Family Medicine

## 2018-12-23 VITALS — BP 135/81 | HR 69 | Ht 64.0 in

## 2018-12-23 DIAGNOSIS — M25522 Pain in left elbow: Secondary | ICD-10-CM

## 2018-12-23 MED ORDER — IBUPROFEN-FAMOTIDINE 800-26.6 MG PO TABS: 1.0000 | ORAL_TABLET | Freq: Three times a day (TID) | ORAL | 3 refills | Status: DC

## 2018-12-23 NOTE — Patient Instructions (Signed)
Good to see you Please try the duexis as needed   Please send me a message in MyChart with any questions or updates.  We will schedule a virtual visit after the MRI to discuss the next steps and findings.   --Dr. Raeford Razor

## 2018-12-23 NOTE — Assessment & Plan Note (Signed)
Pain is ongoing.  Acute on chronic in nature.  Has been dealing with this for several months.  She does get improvement when she does use prednisone.  Her uric acid was 7.5 a few months ago but did not receive any improvement with the colchicine.  Seems to have developed lateral epicondylitis secondary to the lack of full extension. -MRI of the elbow to evaluate for chondral lesion or inflammatory condition of the elbow. -Counseled on supportive care. -Provided Duexis sample and sent Duexis in.

## 2018-12-23 NOTE — Progress Notes (Signed)
[name] - 53 y.o. female MRN [id number]  Date of birth: Apr 04, 1965  SUBJECTIVE:  Including CC & ROS.  Chief Complaint  Patient presents with  . Follow-up    follow up for left elbow    [name] is a 53 y.o. female that is following up for her left elbow pain.  The pain is been ongoing for 4 to 5 months now.  She denies any specific inciting event.  She does get improvement when she uses prednisone.  She not get any improvement with colchicine.  She tried some curls and that seemed to exacerbate some of the pain.  The pain is over the lateral epicondyle.  She does lack full extension still.  This is worse when she is walking and the pain is severe.  She has to use her other arm just to passively flex the elbow.  Pain seems to be localized to the elbow..  Independent review of the left elbow x-ray from 10/20 shows no acute abnormality.   Review of Systems  Constitutional: Negative for fever.  HENT: Negative for congestion.   Respiratory: Negative for cough.   Cardiovascular: Negative for chest pain.  Gastrointestinal: Negative for abdominal pain.  Musculoskeletal: Negative for back pain.  Skin: Negative for color change.  Neurological: Negative for weakness.  Hematological: Negative for adenopathy.    HISTORY: Past Medical, Surgical, Social, and Family History Reviewed & Updated per EMR.   Pertinent Historical Findings include:  Past Medical History:  Diagnosis Date  . Anemia    iron def anemia, iron infusion  . Bell's palsy   . Celiac disease   . Fibroid   . GERD (gastroesophageal reflux disease)   . History of pneumonia   . Hypertension   . Iron deficiency anemia   . Menorrhagia   . Psoriasis   . STD (sexually transmitted disease)    HSV1  . Vertigo     Past Surgical History:  Procedure Laterality Date  . APPENDECTOMY      52 years old    Allergies  Allergen Reactions  . Augmentin [Amoxicillin-Pot Clavulanate]     **Patient repots she can safely  take Amoxicillin  . Erythromycin Other (See Comments)    Sensitivity/stomach upset. Can take Zithromax without problems  . Bactrim [Sulfamethoxazole-Trimethoprim] Rash    Family History  Problem Relation Age of Onset  . Cancer Mother        lung  . Cancer Brother        testicular  . Heart attack Brother   . Heart failure Father   . Stroke Sister      Social History   Socioeconomic History  . Marital status: Single    Spouse name: Not on file  . Number of children: Not on file  . Years of education: Not on file  . Highest education level: Not on file  Occupational History  . Not on file  Social Needs  . Financial resource strain: Not on file  . Food insecurity    Worry: Not on file    Inability: Not on file  . Transportation needs    Medical: Not on file    Non-medical: Not on file  Tobacco Use  . Smoking status: Former Research scientist (life sciences)  . Smokeless tobacco: Never Used  Substance and Sexual Activity  . Alcohol use: Yes    Alcohol/week: 6.0 - 8.0 standard drinks    Types: 6 - 8 Standard drinks or equivalent per week  . Drug use:  No  . Sexual activity: Not Currently    Partners: Male    Birth control/protection: Post-menopausal  Lifestyle  . Physical activity    Days per week: Not on file    Minutes per session: Not on file  . Stress: Not on file  Relationships  . Social Herbalist on phone: Not on file    Gets together: Not on file    Attends religious service: Not on file    Active member of club or organization: Not on file    Attends meetings of clubs or organizations: Not on file    Relationship status: Not on file  . Intimate partner violence    Fear of current or ex partner: Not on file    Emotionally abused: Not on file    Physically abused: Not on file    Forced sexual activity: Not on file  Other Topics Concern  . Not on file  Social History Narrative  . Not on file     PHYSICAL EXAM:  VS: BP 135/81   Pulse 69   Ht 5\' 4"  (1.626 m)    LMP 03/01/2013   BMI 32.79 kg/m  Physical Exam Gen: NAD, alert, cooperative with exam, well-appearing ENT: normal lips, normal nasal mucosa,  Eye: normal EOM, normal conjunctiva and lids CV:  no edema, +2 pedal pulses   Resp: no accessory muscle use, non-labored,  Skin: no rashes, no areas of induration  Neuro: normal tone, normal sensation to touch Psych:  normal insight, alert and oriented MSK:  Left elbow: Tenderness to palpation over the lateral epicondyle. Limited extension. Tenderness palpation of the joint line. Pain with middle finger extension to resistance. Pain with resistance to supination and pronation. Neurovascularly intact     ASSESSMENT & PLAN:   Left elbow pain Pain is ongoing.  Acute on chronic in nature.  Has been dealing with this for several months.  She does get improvement when she does use prednisone.  Her uric acid was 7.5 a few months ago but did not receive any improvement with the colchicine.  Seems to have developed lateral epicondylitis secondary to the lack of full extension. -MRI of the elbow to evaluate for chondral lesion or inflammatory condition of the elbow. -Counseled on supportive care. -Provided Duexis sample and sent Duexis in.

## 2018-12-23 NOTE — Progress Notes (Signed)
Medication Samples have been provided to the patient.  Drug name: Duexis       Strength: 800mg /26.6mg         Qty: 1 Box  LOTRC:[id number]  Exp.Date: 01/2020  Dosing instructions: Take 1 tablet by mouth three (3) times a day.  The patient has been instructed regarding the correct time, dose, and frequency of taking this medication, including desired effects and most common side effects.   Sherrie George, MA 2:57 PM 12/23/2018

## 2018-12-24 MED FILL — OTEZLA 30 MG TABS: 30 | 30 days supply | Qty: 60 | Fill #0

## 2019-01-13 MED FILL — PANTOPRAZOLE SOD DR 40 MG T: 40 | 90 days supply | Qty: 90 | Fill #0

## 2019-01-13 MED FILL — LOSARTAN POTASSIUM 50 MG TA: 50 | 90 days supply | Qty: 180 | Fill #1

## 2019-01-13 MED FILL — FUROSEMIDE 20 MG TAB: 20 | 90 days supply | Qty: 90 | Fill #1

## 2019-01-17 ENCOUNTER — Ambulatory Visit
Admission: RE | Admit: 2019-01-17 | Discharge: 2019-01-17 | Disposition: A | Discharge status: Home / Self Care | Payer: 59 | Source: Ambulatory Visit | Attending: Family Medicine | Admitting: Family Medicine

## 2019-01-17 ENCOUNTER — Other Ambulatory Visit: Payer: Self-pay

## 2019-01-17 DIAGNOSIS — M67822 Other specified disorders of synovium, left elbow: Secondary | ICD-10-CM | POA: Diagnosis not present

## 2019-01-17 DIAGNOSIS — M25522 Pain in left elbow: Secondary | ICD-10-CM

## 2019-01-21 ENCOUNTER — Telehealth (INDEPENDENT_AMBULATORY_CARE_PROVIDER_SITE_OTHER): Payer: 59 | Admitting: Family Medicine

## 2019-01-21 DIAGNOSIS — M25522 Pain in left elbow: Secondary | ICD-10-CM

## 2019-01-21 NOTE — Progress Notes (Signed)
Virtual Visit via Video Note  I connected with [name] on 01/21/19 at  3:50 PM EST by a video enabled telemedicine application and verified that I am speaking with the correct person using two identifiers.   I discussed the limitations of evaluation and management by telemedicine and the availability of in person appointments. The patient expressed understanding and agreed to proceed.  History of Present Illness:  [name] is a 53 year old female is following up for her left elbow pain.  Pain is ongoing and most notices it after a walk when it is down beside her body.  The pain resolved within 10 to 15 minutes of stopping walking.  She does have good response with the prednisone.  The MRI was unrevealing for any significant joint problem.  Did show some chronic tendinosis of the common extensors.   Observations/Objective:  Gen: NAD, alert, cooperative with exam, well-appearing ENT: normal lips, normal nasal mucosa,  Eye: normal EOM, normal conjunctiva and lids Resp: no accessory muscle use, non-labored,  Psych:  normal insight, alert and oriented   Assessment and Plan:  Left elbow pain  Pain is ongoing and MRI was unrevealing for any significant joint problem.  It does not seem to be referred as lacked extension from time to time.  She did a good response from using prednisone. -Would consider referral to rheumatology. -Counseled on home exercise therapy and supportive care -Could consider injection   Follow Up Instructions:    I discussed the assessment and treatment plan with the patient. The patient was provided an opportunity to ask questions and all were answered. The patient agreed with the plan and demonstrated an understanding of the instructions.   The patient was advised to call back or seek an in-person evaluation if the symptoms worsen or if the condition fails to improve as anticipated.   Clearance Coots, MD

## 2019-01-21 NOTE — Assessment & Plan Note (Signed)
Pain is ongoing and MRI was unrevealing for any significant joint problem.  It does not seem to be referred as lacked extension from time to time.  She did a good response from using prednisone. -Would consider referral to rheumatology. -Counseled on home exercise therapy and supportive care -Could consider injection

## 2019-01-29 MED FILL — OTEZLA 30 MG TABS: 30 | 30 days supply | Qty: 60 | Fill #1

## 2019-02-03 ENCOUNTER — Ambulatory Visit (INDEPENDENT_AMBULATORY_CARE_PROVIDER_SITE_OTHER): Payer: 59 | Admitting: Certified Nurse Midwife

## 2019-02-03 ENCOUNTER — Other Ambulatory Visit: Payer: Self-pay

## 2019-02-03 ENCOUNTER — Encounter: Payer: Self-pay | Admitting: Certified Nurse Midwife

## 2019-02-03 VITALS — BP 110/64 | HR 68 | Temp 97.5°F | Resp 16 | Ht 64.25 in | Wt 182.0 lb

## 2019-02-03 DIAGNOSIS — E559 Vitamin D deficiency, unspecified: Secondary | ICD-10-CM

## 2019-02-03 DIAGNOSIS — Z113 Encounter for screening for infections with a predominantly sexual mode of transmission: Secondary | ICD-10-CM

## 2019-02-03 DIAGNOSIS — Z0000 Encounter for general adult medical examination without abnormal findings: Secondary | ICD-10-CM

## 2019-02-03 DIAGNOSIS — Z01419 Encounter for gynecological examination (general) (routine) without abnormal findings: Secondary | ICD-10-CM | POA: Diagnosis not present

## 2019-02-03 LAB — POCT URINALYSIS DIPSTICK
Bilirubin, UA: NEGATIVE
Blood, UA: NEGATIVE
Glucose, UA: NEGATIVE
Ketones, UA: NEGATIVE
Leukocytes, UA: NEGATIVE
Nitrite, UA: NEGATIVE
Protein, UA: NEGATIVE
Urobilinogen, UA: NEGATIVE E.U./dL — AB
pH, UA: 5 (ref 5.0–8.0)

## 2019-02-03 NOTE — Progress Notes (Signed)
53 y.o. G0P0000 Single  Caucasian Fe here for annual exam.Post menopausal no HRT. No vaginal bleeding or vaginal dryness. Ended relationship with partner and desires STD screening. Has rectal fissure and saw Dr. Collene Mares and currently treating, request no rectal exam. Has not been exercising as much as before. Sees PCP Dr. Gerarda Fraction for edema and hypertension, cholesterol management. Patient has had first Duck vaccine for [employer]. No other health issues. Vitamin D needs to be drawn.  Patient's last menstrual period was 03/01/2013.          Sexually active: No.  The current method of family planning is post menopausal status.    Exercising: Yes.    online workout Smoker:  no  ROS  Health Maintenance: Pap:  11-02-15 neg, 01-22-18 neg HPV HR neg History of Abnormal Pap: no MMG:  03-05-2018 category b density birads 2:neg Self Breast exams: no Colonoscopy:  2018 neg BMD:   none TDaP:  2017 Shingles: no Pneumonia: no Hep C and HIV: both neg 2018 Labs: poct urine-neg   reports that she has quit smoking. She has never used smokeless tobacco. She reports current alcohol use of about 6.0 - 8.0 standard drinks of alcohol per week. She reports that she does not use drugs.  Past Medical History:  Diagnosis Date  . Anemia    iron def anemia, iron infusion  . Bell's palsy   . Celiac disease   . Fibroid   . GERD (gastroesophageal reflux disease)   . History of pneumonia   . Hypertension   . Iron deficiency anemia   . Menorrhagia   . Psoriasis   . STD (sexually transmitted disease)    HSV1  . Vertigo     Past Surgical History:  Procedure Laterality Date  . APPENDECTOMY      53 years old    Current Outpatient Medications  Medication Sig Dispense Refill  . ALPRAZolam (XANAX) 0.25 MG tablet   2  . Apremilast (OTEZLA) 30 MG TABS Take 1 tablet by mouth 2 (two) times a day. 60 tablet 4  . atorvastatin (LIPITOR) 40 MG tablet     . Calcium Carbonate-Vitamin D (CALCIUM + D) 600-200 MG-UNIT TABS  Take 1 tablet by mouth as needed.     . Coenzyme Q10 (CO Q 10 PO) Take by mouth daily.    . furosemide (LASIX) 20 MG tablet     . losartan (COZAAR) 50 MG tablet Take 50 mg by mouth daily.    . Multiple Vitamins-Minerals (MULTIVITAMIN PO) Take by mouth daily.    . pantoprazole (PROTONIX) 40 MG tablet Take 40 mg by mouth daily.    . Ibuprofen-Famotidine 800-26.6 MG TABS Take 1 tablet by mouth 3 (three) times daily. (Patient not taking: Reported on 02/03/2019) 90 tablet 3   No current facility-administered medications for this visit.    Family History  Problem Relation Age of Onset  . Cancer Mother        lung  . Cancer Brother        testicular  . Heart attack Brother   . Heart failure Father   . Stroke Sister     ROS:  Pertinent items are noted in HPI.  Otherwise, a comprehensive ROS was negative.  Exam:   BP 110/64   Pulse 68   Temp (!) 97.5 F (36.4 C) (Skin)   Resp 16   Ht 5' 4.25" (1.632 m)   Wt 182 lb (82.6 kg)   LMP 03/01/2013   BMI 31.00  kg/m  Height: 5' 4.25" (163.2 cm) Ht Readings from Last 3 Encounters:  02/03/19 5' 4.25" (1.632 m)  12/23/18 5' 4"  (1.626 m)  11/26/18 5' 4"  (1.626 m)    General appearance: alert, cooperative and appears stated age Head: Normocephalic, without obvious abnormality, atraumatic Neck: no adenopathy, supple, symmetrical, trachea midline and thyroid normal to inspection and palpation Lungs: clear to auscultation bilaterally Breasts: normal appearance, no masses or tenderness, No nipple retraction or dimpling, No nipple discharge or bleeding, No axillary or supraclavicular adenopathy Heart: regular rate and rhythm Abdomen: soft, non-tender; no masses,  no organomegaly Extremities: extremities normal, atraumatic, no cyanosis or edema Skin: Skin color, texture, turgor normal. No rashes or lesions Lymph nodes: Cervical, supraclavicular, and axillary nodes normal. No abnormal inguinal nodes palpated Neurologic: Grossly  normal   Pelvic: External genitalia:  no lesions              Urethra:  normal appearing urethra with no masses, tenderness or lesions              Bartholin's and Skene's: normal                 Vagina: normal appearing vagina with normal color and discharge, no lesions              Cervix: no cervical motion tenderness, no lesions and normal appearance              Pap taken: No. Bimanual Exam:  Uterus:  normal size, contour, position, consistency, mobility, non-tender and anteverted              Adnexa: normal adnexa and no mass, fullness, tenderness               Rectovaginal: Confirms               Anus:  normal appearance, declined rectal exam due to fissure, no lesions external  Chaperone present: yes  A:  Well Woman with normal exam  Menopausal no HRT  Hypertension, cholesterol with PCP management  STD screening  Vitamin d Deficiency  P:   Reviewed health and wellness pertinent to exam  Aware of need to advise if vaginal bleeding or dryness  Continue follow up with PCP as indicated.  Discussed STD prevention with condom use. Labs: Affirm, RPR, HIV, Hep C, Gc/chlamydia  Lab: Vitamin D  Pap smear: no   counseled on breast self exam, mammography screening, STD prevention, HIV risk factors and prevention, feminine hygiene, adequate intake of calcium and vitamin D, diet and exercise  return annually or prn  An After Visit Summary was printed and given to the patient.

## 2019-02-03 NOTE — Patient Instructions (Signed)

## 2019-02-04 LAB — VAGINITIS/VAGINOSIS, DNA PROBE
Candida Species: NEGATIVE
Gardnerella vaginalis: NEGATIVE
Trichomonas vaginosis: NEGATIVE

## 2019-02-04 LAB — HIV ANTIBODY (ROUTINE TESTING W REFLEX): HIV Screen 4th Generation wRfx: NONREACTIVE

## 2019-02-04 LAB — VITAMIN D 25 HYDROXY (VIT D DEFICIENCY, FRACTURES): Vit D, 25-Hydroxy: 21.2 ng/mL — ABNORMAL LOW (ref 30.0–100.0)

## 2019-02-04 LAB — GC/CHLAMYDIA PROBE AMP
Chlamydia trachomatis, NAA: NEGATIVE
Neisseria Gonorrhoeae by PCR: NEGATIVE

## 2019-02-04 LAB — HEPATITIS C ANTIBODY: Hep C Virus Ab: <0.1 s/co ratio (ref 0.0–0.9)

## 2019-02-04 LAB — RPR: RPR Ser Ql: NONREACTIVE

## 2019-02-10 MED FILL — ATORVASTATIN 40 MG TABLET: 40 | 90 days supply | Qty: 90 | Fill #0

## 2019-03-03 ENCOUNTER — Other Ambulatory Visit: Payer: Self-pay | Admitting: Pharmacist

## 2019-03-03 MED ORDER — OTEZLA 30 MG PO TABS: 1.0000 | ORAL_TABLET | Freq: Two times a day (BID) | ORAL | 0 refills | Status: DC

## 2019-03-04 MED FILL — OTEZLA 30 MG TABS: 30 | 30 days supply | Qty: 60 | Fill #0

## 2019-03-13 ENCOUNTER — Ambulatory Visit: Payer: 59 | Admitting: Internal Medicine

## 2019-03-28 MED FILL — OTEZLA 30 MG TABS: 30 | 30 days supply | Qty: 60 | Fill #0

## 2019-03-31 ENCOUNTER — Ambulatory Visit (INDEPENDENT_AMBULATORY_CARE_PROVIDER_SITE_OTHER): Payer: 59 | Admitting: Internal Medicine

## 2019-03-31 ENCOUNTER — Other Ambulatory Visit: Payer: Self-pay

## 2019-03-31 ENCOUNTER — Encounter: Payer: Self-pay | Admitting: Internal Medicine

## 2019-03-31 VITALS — BP 151/81 | HR 72 | Temp 97.7°F | Ht 64.25 in | Wt 187.2 lb

## 2019-03-31 DIAGNOSIS — R011 Cardiac murmur, unspecified: Secondary | ICD-10-CM

## 2019-03-31 DIAGNOSIS — K900 Celiac disease: Secondary | ICD-10-CM | POA: Diagnosis not present

## 2019-03-31 DIAGNOSIS — D509 Iron deficiency anemia, unspecified: Secondary | ICD-10-CM

## 2019-03-31 DIAGNOSIS — M25522 Pain in left elbow: Secondary | ICD-10-CM | POA: Diagnosis not present

## 2019-03-31 DIAGNOSIS — I10 Essential (primary) hypertension: Secondary | ICD-10-CM | POA: Diagnosis not present

## 2019-03-31 DIAGNOSIS — E782 Mixed hyperlipidemia: Secondary | ICD-10-CM | POA: Diagnosis not present

## 2019-03-31 MED ORDER — LOSARTAN POTASSIUM 50 MG PO TABS: 50.0000 mg | ORAL_TABLET | Freq: Every day | ORAL | 3 refills | Status: DC

## 2019-03-31 MED ORDER — LOSARTAN POTASSIUM 25 MG PO TABS: 25.0000 mg | ORAL_TABLET | Freq: Every day | ORAL | 3 refills | Status: DC

## 2019-03-31 MED FILL — LOSARTAN POTASSIUM 25 MG TA: 25 | 90 days supply | Qty: 90 | Fill #0

## 2019-03-31 MED FILL — LOSARTAN POTASSIUM 50 MG TA: 50 | 90 days supply | Qty: 90 | Fill #0

## 2019-03-31 NOTE — Patient Instructions (Addendum)
Medication Instructions:  Increase Losartan to 75 mg ( 1  - 50 mg  and 1 - 25 mg  Tablet together  To equal 75 mg )   Daily   *If you need a refill on your cardiac medications before your next appointment, please call your pharmacy*  Lab Work: Not needed  Testing/Procedures: Not needed  Follow-Up: At [facility], you and your health needs are our priority.  As part of our continuing mission to provide you with exceptional heart care, we have created designated Provider Care Teams.  These Care Teams include your primary Cardiologist (physician) and Advanced Practice Providers (APPs -  Physician Assistants and Nurse Practitioners) who all work together to provide you with the care you need, when you need it.  Your next appointment:   6 month(s)  The format for your next appointment:   In Person  Provider:   Cherlynn Kaiser, MD  Other Instructions n/a

## 2019-03-31 NOTE — Progress Notes (Signed)
Cardiology Office Note:    Date:  03/31/2019   ID:  [name], DOB 02-Nov-1965, MRN [id number]  PCP:  Redmond School, MD  Cardiologist:  Elouise Munroe, MD  Electrophysiologist:  None   Referring MD: Redmond School, MD   Chief Complaint: f/u HTN, murmur  History of Present Illness:    [name] is a 54 y.o. female with a history of iron deficiency anemia, celiac's disease, hypertension, hyperlipidemia who presents today for cardiovascular follow up.   Normal myoview stress test and unremarkable echo at last visit in February 2020.   Symptoms today - feeling well.   Increased lipitor 40 mg daily. Has repeat labs in June '21. No myalgias but left elbow pain is troubling her.   Increased losartan and stopped hctz b/c of possible gout symptoms. Started lasix, doing well with swelling.  BP at work 130-140s/70-80s.  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   Past Medical History:  Diagnosis Date  . Anemia    iron def anemia, iron infusion  . Bell's palsy   . Celiac disease   . Fibroid   . GERD (gastroesophageal reflux disease)   . History of pneumonia   . Hypertension   . Iron deficiency anemia   . Menorrhagia   . Psoriasis   . STD (sexually transmitted disease)    HSV1  . Vertigo     Past Surgical History:  Procedure Laterality Date  . APPENDECTOMY      54 years old    Current Medications: Current Meds  Medication Sig  . ALPRAZolam (XANAX) 0.25 MG tablet   . Apremilast (OTEZLA) 30 MG TABS Take 1 tablet by mouth 2 (two) times daily.  Marland Kitchen atorvastatin (LIPITOR) 40 MG tablet   . Calcium Carbonate-Vitamin D (CALCIUM + D) 600-200 MG-UNIT TABS Take 1 tablet by mouth as needed.   . Coenzyme Q10 (CO Q 10 PO) Take by mouth daily.  . furosemide (LASIX) 20 MG tablet   . Ibuprofen-Famotidine 800-26.6 MG TABS Take 1 tablet by mouth 3 (three) times daily.  . Multiple Vitamins-Minerals (MULTIVITAMIN PO) Take by mouth daily.  . pantoprazole (PROTONIX) 40 MG tablet Take 40 mg  by mouth daily.     Allergies:   Augmentin [amoxicillin-pot clavulanate], Erythromycin, and Bactrim [sulfamethoxazole-trimethoprim]   Social History   Socioeconomic History  . Marital status: Single    Spouse name: Not on file  . Number of children: Not on file  . Years of education: Not on file  . Highest education level: Not on file  Occupational History  . Not on file  Tobacco Use  . Smoking status: Former Research scientist (life sciences)  . Smokeless tobacco: Never Used  Substance and Sexual Activity  . Alcohol use: Yes    Alcohol/week: 6.0 - 8.0 standard drinks    Types: 6 - 8 Standard drinks or equivalent per week  . Drug use: No  . Sexual activity: Not Currently    Partners: Male    Birth control/protection: Post-menopausal  Other Topics Concern  . Not on file  Social History Narrative  . Not on file   Social Determinants of Health   Financial Resource Strain:   . Difficulty of Paying Living Expenses: Not on file  Food Insecurity:   . Worried About Charity fundraiser in the Last Year: Not on file  . Ran Out of Food in the Last Year: Not on file  Transportation Needs:   . Lack of Transportation (Medical): Not on file  .  Lack of Transportation (Non-Medical): Not on file  Physical Activity:   . Days of Exercise per Week: Not on file  . Minutes of Exercise per Session: Not on file  Stress:   . Feeling of Stress : Not on file  Social Connections:   . Frequency of Communication with Friends and Family: Not on file  . Frequency of Social Gatherings with Friends and Family: Not on file  . Attends Religious Services: Not on file  . Active Member of Clubs or Organizations: Not on file  . Attends Archivist Meetings: Not on file  . Marital Status: Not on file     Family History: The patient's family history includes Cancer in her brother and mother; Heart attack in her brother; Heart failure in her father; Stroke in her sister.  ROS:   Please see the history of present  illness.    All other systems reviewed and are negative.  EKGs/Labs/Other Studies Reviewed:    The following studies were reviewed today:  EKG:  SR, short PR (110 ms)  Recent Labs: No results found for requested labs within last 8760 hours.  Recent Lipid Panel No results found for: CHOL, TRIG, HDL, CHOLHDL, VLDL, LDLCALC, LDLDIRECT  Physical Exam:    VS:  BP (!) 151/81   Pulse 72   Temp 97.7 F (36.5 C)   Ht 5' 4.25" (1.632 m)   Wt 187 lb 3.2 oz (84.9 kg)   LMP 03/01/2013   SpO2 95%   BMI 31.88 kg/m     Wt Readings from Last 5 Encounters:  03/31/19 187 lb 3.2 oz (84.9 kg)  02/03/19 182 lb (82.6 kg)  04/11/18 191 lb (86.6 kg)  03/18/18 191 lb 12.8 oz (87 kg)  01/22/18 188 lb (85.3 kg)     Constitutional: No acute distress Eyes: sclera non-icteric, normal conjunctiva and lids ENMT: mask in place Cardiovascular: regular rhythm, normal rate, no murmurs. S1 and S2 normal. Radial pulses normal bilaterally. No jugular venous distention.  Respiratory: clear to auscultation bilaterally GI : normal bowel sounds, soft and nontender. No distention.   MSK: extremities warm, well perfused. No edema.  NEURO: grossly nonfocal exam, moves all extremities. PSYCH: alert and oriented x 3, normal mood and affect.   ASSESSMENT:    1. Essential hypertension   2. Systolic murmur   3. Mixed hyperlipidemia   4. Iron deficiency anemia, unspecified iron deficiency anemia type   5. Left elbow pain   6. Celiac disease    PLAN:    HTN - will increase losartan to 75 mg a day and monitor for response.  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  Hyperlipidemia-patient has increased her atorvastatin with her PCP to 40 mg daily, she looks forward to having laboratory studies performed this summer to evaluate response.  Cardiovascular risk reduction-patient is  eager to begin using her Peloton bike, and to lose weight.  We discussed positive impact of weight loss on blood pressure management.  She is also planning to make better dietary choices for the benefit of her hyperlipidemia and hypertension.  Iron deficiency anemia-last hemoglobin within the normal range, she feels this may have been primarily due to celiac disease which is under control and diet managed.  Lower extremity swelling-patient continues on furosemide.  Total time of encounter: 30 minutes total time of  encounter, including 20 minutes spent in face-to-face patient care. This time includes coordination of care and counseling regarding above mentioned problem list. Remainder of non-face-to-face time involved reviewing chart documents/testing relevant to the patient encounter and documentation in the medical record.  Cherlynn Kaiser, MD [place]  [facility]    Medication Adjustments/Labs and Tests Ordered: Current medicines are reviewed at length with the patient today.  Concerns regarding medicines are outlined above.  Orders Placed This Encounter  Procedures  . EKG 12-Lead   Meds ordered this encounter  Medications  . losartan (COZAAR) 50 MG tablet    Sig: Take 1 tablet (50 mg total) by mouth daily.    Dispense:  90 tablet    Refill:  3    Need both 50 mg and 25 mg equal total of 75 mg  . losartan (COZAAR) 25 MG tablet    Sig: Take 1 tablet (25 mg total) by mouth daily. Addition to 50 mg to equal 75 mg    Dispense:  90 tablet    Refill:  3    Patient Instructions  Medication Instructions:  Increase Losartan to 75 mg ( 1  - 50 mg  and 1 - 25 mg  Tablet together  To equal 75 mg )   Daily   *If you need a refill on your cardiac medications before your next appointment, please call your pharmacy*  Lab Work: Not needed  Testing/Procedures: Not needed  Follow-Up: At [facility], you and your health needs are our priority.  As part of our continuing mission  to provide you with exceptional heart care, we have created designated Provider Care Teams.  These Care Teams include your primary Cardiologist (physician) and Advanced Practice Providers (APPs -  Physician Assistants and Nurse Practitioners) who all work together to provide you with the care you need, when you need it.  Your next appointment:   6 month(s)  The format for your next appointment:   In Person  Provider:   Cherlynn Kaiser, MD  Other Instructions n/a

## 2019-04-15 MED FILL — PANTOPRAZOLE SOD DR 40 MG T: 40 | 90 days supply | Qty: 90 | Fill #1

## 2019-04-15 MED FILL — FUROSEMIDE 20 MG TAB: 20 | 90 days supply | Qty: 90 | Fill #2

## 2019-04-23 MED FILL — OTEZLA 30 MG TABS: 30 | 30 days supply | Qty: 60 | Fill #1

## 2019-04-28 ENCOUNTER — Encounter: Payer: Self-pay | Admitting: Certified Nurse Midwife

## 2019-05-19 MED FILL — ATORVASTATIN 40 MG TABLET: 40 | 90 days supply | Qty: 90 | Fill #1

## 2019-05-20 ENCOUNTER — Other Ambulatory Visit: Payer: Self-pay | Admitting: Pharmacist

## 2019-05-20 MED ORDER — OTEZLA 30 MG PO TABS: 1.0000 | ORAL_TABLET | Freq: Two times a day (BID) | ORAL | 1 refills | Status: DC

## 2019-05-23 MED FILL — OTEZLA 30 MG TABS: 30 | 30 days supply | Qty: 60 | Fill #0

## 2019-06-23 DIAGNOSIS — D3132 Benign neoplasm of left choroid: Secondary | ICD-10-CM | POA: Diagnosis not present

## 2019-06-26 MED FILL — LOSARTAN POTASSIUM 50 MG TA: 50 | 90 days supply | Qty: 90 | Fill #1

## 2019-06-26 MED FILL — LOSARTAN POTASSIUM 25 MG TA: 25 | 90 days supply | Qty: 90 | Fill #1

## 2019-07-22 MED FILL — OTEZLA 30 MG TABS: 30 | 30 days supply | Qty: 60 | Fill #0

## 2019-07-30 ENCOUNTER — Other Ambulatory Visit: Payer: Self-pay | Admitting: Pharmacist

## 2019-07-30 MED ORDER — OTEZLA 30 MG PO TABS: 1.0000 | ORAL_TABLET | Freq: Two times a day (BID) | ORAL | 1 refills | Status: DC

## 2019-07-31 ENCOUNTER — Other Ambulatory Visit: Payer: Self-pay

## 2019-07-31 ENCOUNTER — Ambulatory Visit (HOSPITAL_BASED_OUTPATIENT_CLINIC_OR_DEPARTMENT_OTHER): Payer: 59 | Admitting: Pharmacist

## 2019-07-31 DIAGNOSIS — Z79899 Other long term (current) drug therapy: Secondary | ICD-10-CM

## 2019-07-31 NOTE — Progress Notes (Signed)
   S: Patient presents today to the for review of her medication.   Patient is currently taking Otezla for psoriasis. Patient is managed by Dr. Ubaldo Glassing for this.   Adherence: denies any missed doses.   Dose: 30 mg PO BID  Efficacy: reports that it is working well again and works better than other options for her.   Monitoring: - GI Upset: denies  - Headache: denies - Weight loss: denies - S/sx of infection: denies - Mood changes: denies  O:  Lab Results  Component Value Date   WBC 7.8 03/01/2013   HGB 16.0 (H) 03/01/2013   HCT 48.1 (H) 03/01/2013   MCV 88.3 03/01/2013   PLT 217 03/01/2013      Chemistry      Component Value Date/Time   NA 143 03/01/2013 1650   K 3.5 (L) 03/01/2013 1650   CL 100 03/01/2013 1650   CO2 22 03/01/2013 1650   BUN 14 03/01/2013 1650   CREATININE 0.90 03/01/2013 1650      Component Value Date/Time   CALCIUM 9.6 03/01/2013 1650   ALKPHOS 86 03/01/2013 1650   AST 24 03/01/2013 1650   ALT 24 03/01/2013 1650   BILITOT 0.8 03/01/2013 1650       A/P: 1. Medication review: patient currently on Otezla for psoriasis and is tolerating it well with no adverse effects and improved control of her psoriasis. Reviewed the medication with her, including how the medication works, possible adverse effects, and the need for regular follow up with her dermatologist. Patient aware of possible adverse effects such as GI upset, mood changes (depression), and weight loss. No recommendations for any changes.    Benard Halsted, PharmD, [place] [phone]

## 2019-08-18 MED FILL — OTEZLA 30 MG TABS: 30 | 30 days supply | Qty: 60 | Fill #0

## 2019-08-21 ENCOUNTER — Telehealth: Payer: Self-pay | Admitting: Internal Medicine

## 2019-08-21 MED FILL — ATORVASTATIN CALCIUM 40 MG: 40 | 90 days supply | Qty: 90 | Fill #0

## 2019-08-21 NOTE — Telephone Encounter (Signed)
LVM for patient to return call to get scheduled with Margaretann Loveless from recall list

## 2019-08-26 ENCOUNTER — Other Ambulatory Visit (HOSPITAL_BASED_OUTPATIENT_CLINIC_OR_DEPARTMENT_OTHER): Payer: Self-pay | Admitting: Internal Medicine

## 2019-08-26 DIAGNOSIS — Z1231 Encounter for screening mammogram for malignant neoplasm of breast: Secondary | ICD-10-CM

## 2019-09-08 ENCOUNTER — Ambulatory Visit (HOSPITAL_BASED_OUTPATIENT_CLINIC_OR_DEPARTMENT_OTHER)
Admission: RE | Admit: 2019-09-08 | Discharge: 2019-09-08 | Disposition: A | Discharge status: Home / Self Care | Payer: 59 | Source: Ambulatory Visit | Attending: Internal Medicine | Admitting: Internal Medicine

## 2019-09-08 ENCOUNTER — Encounter (HOSPITAL_BASED_OUTPATIENT_CLINIC_OR_DEPARTMENT_OTHER): Payer: Self-pay

## 2019-09-08 ENCOUNTER — Other Ambulatory Visit: Payer: Self-pay

## 2019-09-08 DIAGNOSIS — Z1231 Encounter for screening mammogram for malignant neoplasm of breast: Secondary | ICD-10-CM | POA: Diagnosis not present

## 2019-09-11 ENCOUNTER — Other Ambulatory Visit (HOSPITAL_BASED_OUTPATIENT_CLINIC_OR_DEPARTMENT_OTHER): Payer: Self-pay | Admitting: Internal Medicine

## 2019-09-11 DIAGNOSIS — Z681 Body mass index (BMI) 19 or less, adult: Secondary | ICD-10-CM | POA: Diagnosis not present

## 2019-09-11 DIAGNOSIS — L400 Psoriasis vulgaris: Secondary | ICD-10-CM | POA: Diagnosis not present

## 2019-09-11 DIAGNOSIS — I10 Essential (primary) hypertension: Secondary | ICD-10-CM | POA: Diagnosis not present

## 2019-09-11 DIAGNOSIS — L723 Sebaceous cyst: Secondary | ICD-10-CM | POA: Diagnosis not present

## 2019-09-11 DIAGNOSIS — Z0001 Encounter for general adult medical examination with abnormal findings: Secondary | ICD-10-CM | POA: Diagnosis not present

## 2019-09-11 DIAGNOSIS — Z23 Encounter for immunization: Secondary | ICD-10-CM | POA: Diagnosis not present

## 2019-09-11 DIAGNOSIS — J45909 Unspecified asthma, uncomplicated: Secondary | ICD-10-CM | POA: Diagnosis not present

## 2019-09-11 DIAGNOSIS — K219 Gastro-esophageal reflux disease without esophagitis: Secondary | ICD-10-CM | POA: Diagnosis not present

## 2019-09-11 DIAGNOSIS — Z1389 Encounter for screening for other disorder: Secondary | ICD-10-CM | POA: Diagnosis not present

## 2019-09-11 MED FILL — BETAMETHASONE DP 0.05% CRM: 0.05 | 7 days supply | Qty: 15 | Fill #0

## 2019-09-11 MED FILL — FUROSEMIDE 20 MG TABLET: 20 | 30 days supply | Qty: 30 | Fill #0

## 2019-09-11 MED FILL — ALPRAZolam 0.25 MG TABS: 0.25 | 30 days supply | Qty: 120 | Fill #0

## 2019-09-11 MED FILL — ALBUTEROL SULFATE HFA 108 (: 108 (90 BAS | 25 days supply | Qty: 18 | Fill #0

## 2019-09-15 MED FILL — OTEZLA 30 MG TABS: 30 | 30 days supply | Qty: 60 | Fill #1

## 2019-10-07 MED FILL — PANTOPRAZOLE SOD DR 40 MG T: 40 | 90 days supply | Qty: 90 | Fill #3

## 2019-10-07 MED FILL — FUROSEMIDE 20 MG TABLET: 20 | 30 days supply | Qty: 30 | Fill #1

## 2019-10-07 MED FILL — LOSARTAN POTASSIUM 25 MG TA: 25 | 90 days supply | Qty: 90 | Fill #2

## 2019-10-08 ENCOUNTER — Other Ambulatory Visit: Payer: Self-pay | Admitting: Pharmacist

## 2019-10-08 MED ORDER — OTEZLA 30 MG PO TABS: 1.0000 | ORAL_TABLET | Freq: Two times a day (BID) | ORAL | 3 refills | Status: DC

## 2019-10-09 MED FILL — OTEZLA 30 MG TABS: 30 | 30 days supply | Qty: 60 | Fill #0

## 2019-10-14 MED FILL — LOSARTAN POTASSIUM 50 MG TA: 50 | 90 days supply | Qty: 90 | Fill #2

## 2019-10-14 MED FILL — ALPRAZolam 0.25 MG TABS: 0.25 | 30 days supply | Qty: 120 | Fill #1

## 2019-10-21 MED FILL — FLUARIX QUADRIVALENT 0.5 ML: 0.5 | 1 days supply | Qty: 1 | Fill #0

## 2019-11-03 ENCOUNTER — Other Ambulatory Visit: Payer: Self-pay

## 2019-11-03 ENCOUNTER — Emergency Department (INDEPENDENT_AMBULATORY_CARE_PROVIDER_SITE_OTHER): Payer: 59

## 2019-11-03 ENCOUNTER — Emergency Department (INDEPENDENT_AMBULATORY_CARE_PROVIDER_SITE_OTHER)
Admission: EM | Admit: 2019-11-03 | Discharge: 2019-11-03 | Disposition: A | Discharge status: Home / Self Care | Payer: 59 | Source: Home / Self Care

## 2019-11-03 DIAGNOSIS — R05 Cough: Secondary | ICD-10-CM | POA: Diagnosis not present

## 2019-11-03 DIAGNOSIS — R911 Solitary pulmonary nodule: Secondary | ICD-10-CM | POA: Diagnosis not present

## 2019-11-03 DIAGNOSIS — R509 Fever, unspecified: Secondary | ICD-10-CM

## 2019-11-03 DIAGNOSIS — J069 Acute upper respiratory infection, unspecified: Secondary | ICD-10-CM

## 2019-11-03 MED ORDER — BENZONATATE 100 MG PO CAPS: 100.0000 mg | ORAL_CAPSULE | Freq: Three times a day (TID) | ORAL | 0 refills | Status: DC

## 2019-11-03 MED FILL — BENZONATATE 100 MG CAPS: 100 | 7 days supply | Qty: 21 | Fill #0

## 2019-11-03 NOTE — ED Provider Notes (Signed)
[facility]    CSN: [id number] Arrival date & time: 11/03/19  1238      History   Chief Complaint Chief Complaint  Patient presents with  . Fever  . Cough    HPI [name] is a 54 y.o. female.   HPI  [name] is a 54 y.o. female presenting to UC with c/o cough and fever for 3 days, associated fever Tmax 101.4*F this morning. She was tested for COVID through [employer] this morning, she has not received her results yet. Pt is concerned about pneumonia due to persistent fever.  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  Associated generalized body aches, 4/10. No chest pain or SOB. No hx of asthma.    Past Medical History:  Diagnosis Date  . Anemia    iron def anemia, iron infusion  . Bell's palsy   . Celiac disease   . Fibroid   . GERD (gastroesophageal reflux disease)   . History of pneumonia   . Hypertension   . Iron deficiency anemia   . Menorrhagia   . Psoriasis   . STD (sexually transmitted disease)    HSV1  . Vertigo     Patient Active Problem List   Diagnosis Date Noted  . Left elbow pain 11/07/2018  . Left foot pain 11/28/2015  . Psoriasis 09/07/2014  . Celiac disease 09/04/2013    Class: History of  . Iron deficiency anemia 09/23/2010    Past Surgical History:  Procedure Laterality Date  . APPENDECTOMY      54 years old    OB History    Gravida  0   Para  0   Term  0   Preterm  0   AB  0   Living  0     SAB  0   TAB  0   Ectopic  0   Multiple  0   Live Births               Home Medications    Prior to Admission medications   Medication Sig Start Date End Date Taking? Authorizing Provider  ALPRAZolam Duanne Moron) 0.25 MG tablet  12/18/16   [provider]  Apremilast (OTEZLA) 30 MG TABS Take 1 tablet (30 mg total) by mouth 2 (two) times daily. 10/08/19   Tresa Garter, MD  atorvastatin (LIPITOR) 40 MG  tablet  10/14/18   [provider]  benzonatate (TESSALON) 100 MG capsule Take 1-2 capsules (100-200 mg total) by mouth every 8 (eight) hours. 11/03/19   Noe Gens, PA-C  Calcium Carbonate-Vitamin D (CALCIUM + D) 600-200 MG-UNIT TABS Take 1 tablet by mouth as needed.     [provider]  Coenzyme Q10 (CO Q 10 PO) Take by mouth daily.    [provider]  furosemide (LASIX) 20 MG tablet  11/07/18   [provider]  Ibuprofen-Famotidine 800-26.6 MG TABS Take 1 tablet by mouth 3 (three) times daily. 12/23/18   Rosemarie Ax, MD  losartan (COZAAR) 25 MG tablet Take 1 tablet (25 mg total) by mouth daily. Addition to 50 mg to equal 75 mg 03/31/19 06/29/19  Elouise Munroe, MD  losartan (COZAAR) 50 MG tablet Take 1 tablet (50 mg total) by mouth daily. 03/31/19 06/29/19  Elouise Munroe, MD  Multiple Vitamins-Minerals (MULTIVITAMIN PO) Take by mouth daily.  [provider]  pantoprazole (PROTONIX) 40 MG tablet Take 40 mg by mouth daily.    [provider]    Family History Family History  Problem Relation Age of Onset  . Cancer Mother        lung  . Cancer Brother        testicular  . Heart attack Brother   . Heart failure Father   . Stroke Sister     Social History Social History   Tobacco Use  . Smoking status: Former Smoker    Types: Cigarettes    Quit date: 08/07/1998    Years since quitting: 21.2  . Smokeless tobacco: Never Used  Substance Use Topics  . Alcohol use: Yes    Alcohol/week: 6.0 - 8.0 standard drinks    Types: 6 - 8 Standard drinks or equivalent per week  . Drug use: No     Allergies   Augmentin [amoxicillin-pot clavulanate], Erythromycin, and Bactrim [sulfamethoxazole-trimethoprim]   Review of Systems Review of Systems  Constitutional: Positive for fatigue and fever. Negative for chills.  HENT: Positive for congestion. Negative for ear pain, sore throat, trouble swallowing and voice change.    Respiratory: Positive for cough. Negative for shortness of breath.   Cardiovascular: Negative for chest pain and palpitations.  Gastrointestinal: Negative for abdominal pain, diarrhea, nausea and vomiting.  Musculoskeletal: Positive for arthralgias, back pain and myalgias.  Skin: Negative for rash.  Neurological: Positive for headaches. Negative for dizziness and light-headedness.  All other systems reviewed and are negative.    Physical Exam Triage Vital Signs ED Triage Vitals  Enc Vitals Group     BP 11/03/19 1256 (!) 144/83     Pulse Rate 11/03/19 1256 90     Resp 11/03/19 1256 18     Temp 11/03/19 1256 100 F (37.8 C)     Temp Source 11/03/19 1256 Oral     SpO2 11/03/19 1256 95 %     Weight --      Height --      Head Circumference --      Peak Flow --      Pain Score 11/03/19 1254 4     Pain Loc --      Pain Edu? --      Excl. in [place]? --    No data found.  Updated Vital Signs BP (!) 144/83 (BP Location: Right Arm)   Pulse 90   Temp 100 F (37.8 C) (Oral)   Resp 18   LMP 03/01/2013   SpO2 95%   Visual Acuity Right Eye Distance:   Left Eye Distance:   Bilateral Distance:    Right Eye Near:   Left Eye Near:    Bilateral Near:     Physical Exam Vitals and nursing note reviewed.  Constitutional:      General: She is not in acute distress.    Appearance: Normal appearance. She is well-developed. She is not ill-appearing, toxic-appearing or diaphoretic.  HENT:     Head: Normocephalic and atraumatic.     Right Ear: Tympanic membrane and ear canal normal.     Left Ear: Tympanic membrane and ear canal normal.     Nose: Nose normal.     Right Sinus: No maxillary sinus tenderness or frontal sinus tenderness.     Left Sinus: No maxillary sinus tenderness or frontal sinus tenderness.     Mouth/Throat:     Lips: Pink.     Mouth: Mucous membranes are moist.  Pharynx: Oropharynx is clear. Uvula midline.  Cardiovascular:     Rate and Rhythm: Normal rate and  regular rhythm.  Pulmonary:     Effort: Pulmonary effort is normal. No respiratory distress.     Breath sounds: Normal breath sounds. No stridor. No wheezing, rhonchi or rales.  Musculoskeletal:        General: Normal range of motion.     Cervical back: Normal range of motion and neck supple. No tenderness.  Lymphadenopathy:     Cervical: No cervical adenopathy.  Skin:    General: Skin is warm and dry.  Neurological:     Mental Status: She is alert and oriented to person, place, and time.  Psychiatric:        Behavior: Behavior normal.      UC Treatments / Results  Labs (all labs ordered are listed, but only abnormal results are displayed) Labs Reviewed - No data to display  EKG   Radiology DG Chest 2 View  Result Date: 11/03/2019 CLINICAL DATA:  Cough, congestion, fever EXAM: CHEST - 2 VIEW COMPARISON:  None. FINDINGS: The lungs are clear and negative for focal airspace consolidation, pulmonary edema or suspicious pulmonary nodule. No pleural effusion or pneumothorax. Cardiac and mediastinal contours are within normal limits. No acute fracture or lytic or blastic osseous lesions. The visualized upper abdominal bowel gas pattern is unremarkable. IMPRESSION: No active cardiopulmonary disease. Electronically Signed   By: Jacqulynn Cadet M.D.   On: 11/03/2019 13:42    Procedures Procedures (including critical care time)  Medications Ordered in UC Medications - No data to display  Initial Impression / Assessment and Plan / UC Course  I have reviewed the triage vital signs and the nursing notes.  Pertinent labs & imaging results that were available during my care of the patient were reviewed by me and considered in my medical decision making (see chart for details).    Lungs: CTAB O2 Sat 95% on RA without evidence of respiratory distress Reassured pt of normal CXR Encouraged symptomatic tx F/u with [facility] regarding her COVID test F/u with PCP as needed AVS given  Final  Clinical Impressions(s) / UC Diagnoses   Final diagnoses:  Viral URI with cough     Discharge Instructions     You may take 500mg  acetaminophen every 4-6 hours or in combination with ibuprofen 400-600mg  every 6-8 hours as needed for pain, inflammation, and fever.  Be sure to well hydrated with clear liquids and get at least 8 hours of sleep at night, preferably more while sick.   Please follow up with family medicine in 1 week if needed.     ED Prescriptions    Medication Sig Dispense Auth. Provider   benzonatate (TESSALON) 100 MG capsule Take 1-2 capsules (100-200 mg total) by mouth every 8 (eight) hours. 21 capsule Noe Gens, [place]     PDMP not reviewed this encounter.   Noe Gens, [place] 11/03/19 1823

## 2019-11-03 NOTE — ED Triage Notes (Signed)
Patient presents to Urgent Care with complaints of cough and fever since 3 days ago. Patient reports [facility] did her covid test earlier today, is worried she might have pneumonia because she cannot get rid of the fever. 101.4 this morning, took motrin and that brought it down to 100.6. Is a nurse in the ED.

## 2019-11-03 NOTE — Discharge Instructions (Signed)
  You may take 500mg acetaminophen every 4-6 hours or in combination with ibuprofen 400-600mg every 6-8 hours as needed for pain, inflammation, and fever.  Be sure to well hydrated with clear liquids and get at least 8 hours of sleep at night, preferably more while sick.   Please follow up with family medicine in 1 week if needed.   

## 2019-11-04 ENCOUNTER — Other Ambulatory Visit (HOSPITAL_COMMUNITY): Payer: Self-pay

## 2019-11-04 ENCOUNTER — Other Ambulatory Visit: Payer: Self-pay | Admitting: Internal Medicine

## 2019-11-04 DIAGNOSIS — U071 COVID-19: Secondary | ICD-10-CM

## 2019-11-04 DIAGNOSIS — I10 Essential (primary) hypertension: Secondary | ICD-10-CM

## 2019-11-04 NOTE — Progress Notes (Unsigned)
I connected by phone with [name] on 11/04/2019 at 4:22 PM to discuss the potential use of a new treatment for mild to moderate COVID-19 viral infection in non-hospitalized patients.  This patient is a 54 y.o. female that meets the FDA criteria for Emergency Use Authorization of COVID monoclonal antibody casirivimab/imdevimab or bamlanivimab/eteseviamb.  Has a (+) direct SARS-CoV-2 viral test result  Has mild or moderate COVID-19   Is NOT hospitalized due to COVID-19  Is within 10 days of symptom onset  Has at least one of the high risk factor(s) for progression to severe COVID-19 and/or hospitalization as defined in EUA.  Specific high risk criteria : Cardiovascular disease or hypertension   I have spoken and communicated the following to the patient or parent/caregiver regarding COVID monoclonal antibody treatment:  1. FDA has authorized the emergency use for the treatment of mild to moderate COVID-19 in adults and pediatric patients with positive results of direct SARS-CoV-2 viral testing who are 57 years of age and older weighing at least 40 kg, and who are at high risk for progressing to severe COVID-19 and/or hospitalization.  2. The significant known and potential risks and benefits of COVID monoclonal antibody, and the extent to which such potential risks and benefits are unknown.  3. Information on available alternative treatments and the risks and benefits of those alternatives, including clinical trials.  4. Patients treated with COVID monoclonal antibody should continue to self-isolate and use infection control measures (e.g., wear mask, isolate, social distance, avoid sharing personal items, clean and disinfect "high touch" surfaces, and frequent handwashing) according to CDC guidelines.   5. The patient or parent/caregiver has the option to accept or refuse COVID monoclonal antibody treatment.  After reviewing this information with the patient, the patient has  agreed to receive one of the available covid 19 monoclonal antibodies and will be provided an appropriate fact sheet prior to infusion.  [name], [place]

## 2019-11-05 ENCOUNTER — Ambulatory Visit (HOSPITAL_COMMUNITY)
Admission: RE | Admit: 2019-11-05 | Discharge: 2019-11-05 | Disposition: A | Discharge status: Home / Self Care | Payer: 59 | Source: Ambulatory Visit | Attending: Pulmonary Disease | Admitting: Pulmonary Disease

## 2019-11-05 DIAGNOSIS — U071 COVID-19: Secondary | ICD-10-CM | POA: Diagnosis not present

## 2019-11-05 DIAGNOSIS — I10 Essential (primary) hypertension: Secondary | ICD-10-CM | POA: Insufficient documentation

## 2019-11-05 MED ORDER — SODIUM CHLORIDE 0.9 % IV SOLN
1200.0000 mg | Freq: Once | INTRAVENOUS | Status: AC
  Administered 2019-11-05: 1200 mg via INTRAVENOUS

## 2019-11-05 MED ORDER — DIPHENHYDRAMINE HCL 50 MG/ML IJ SOLN: 50.0000 mg | Freq: Once | INTRAMUSCULAR | Status: DC | PRN

## 2019-11-05 MED ORDER — METHYLPREDNISOLONE SODIUM SUCC 125 MG IJ SOLR: 125.0000 mg | Freq: Once | INTRAMUSCULAR | Status: DC | PRN

## 2019-11-05 MED ORDER — SODIUM CHLORIDE 0.9 % IV SOLN: INTRAVENOUS | Status: DC | PRN

## 2019-11-05 MED ORDER — FAMOTIDINE IN NACL 20-0.9 MG/50ML-% IV SOLN: 20.0000 mg | Freq: Once | INTRAVENOUS | Status: DC | PRN

## 2019-11-05 MED ORDER — EPINEPHRINE 0.3 MG/0.3ML IJ SOAJ: 0.3000 mg | Freq: Once | INTRAMUSCULAR | Status: DC | PRN

## 2019-11-05 MED ORDER — ALBUTEROL SULFATE HFA 108 (90 BASE) MCG/ACT IN AERS: 2.0000 | INHALATION_SPRAY | Freq: Once | RESPIRATORY_TRACT | Status: DC | PRN

## 2019-11-05 NOTE — Discharge Instructions (Signed)

## 2019-11-05 NOTE — Progress Notes (Signed)
  Diagnosis: COVID-19  Physician: Dr Joya Gaskins  Procedure: Covid Infusion Clinic Med: casirivimab\imdevimab infusion - Provided patient with casirivimab\imdevimab fact sheet for patients, parents and caregivers prior to infusion.  Complications: No immediate complications noted.  Discharge: Discharged home   Sim Boast 11/05/2019

## 2019-11-06 ENCOUNTER — Encounter: Payer: Self-pay | Admitting: General Practice

## 2019-11-11 DIAGNOSIS — U071 COVID-19: Secondary | ICD-10-CM | POA: Diagnosis not present

## 2019-11-17 MED FILL — ALPRAZolam 0.25 MG TABS: 0.25 | 30 days supply | Qty: 120 | Fill #2

## 2019-11-17 MED FILL — ALBUTEROL SULFATE HFA 108 (: 108 (90 BAS | 25 days supply | Qty: 18 | Fill #1

## 2019-11-20 DIAGNOSIS — E785 Hyperlipidemia, unspecified: Secondary | ICD-10-CM | POA: Diagnosis not present

## 2019-11-20 DIAGNOSIS — Z1389 Encounter for screening for other disorder: Secondary | ICD-10-CM | POA: Diagnosis not present

## 2019-11-20 DIAGNOSIS — R7309 Other abnormal glucose: Secondary | ICD-10-CM | POA: Diagnosis not present

## 2019-11-20 DIAGNOSIS — Z681 Body mass index (BMI) 19 or less, adult: Secondary | ICD-10-CM | POA: Diagnosis not present

## 2019-11-20 DIAGNOSIS — E7489 Other specified disorders of carbohydrate metabolism: Secondary | ICD-10-CM | POA: Diagnosis not present

## 2019-11-20 MED FILL — OTEZLA 30 MG TABS: 30 | 30 days supply | Qty: 60 | Fill #1

## 2019-11-21 MED FILL — FUROSEMIDE 20 MG TAB: 20 | 30 days supply | Qty: 30 | Fill #2

## 2019-12-10 MED FILL — ATORVASTATIN CALCIUM 40 MG: 40 | 90 days supply | Qty: 90 | Fill #0

## 2019-12-15 MED FILL — OTEZLA 30 MG TABS: 30 | 30 days supply | Qty: 60 | Fill #2

## 2019-12-15 MED FILL — FUROSEMIDE 20 MG TAB: 20 | 30 days supply | Qty: 30 | Fill #3

## 2019-12-25 MED FILL — ALPRAZolam 0.25 MG TABS: 0.25 | 30 days supply | Qty: 120 | Fill #3

## 2020-01-12 MED FILL — FUROSEMIDE 20 MG TAB: 20 | 30 days supply | Qty: 30 | Fill #4

## 2020-01-12 MED FILL — LOSARTAN POTASSIUM 25 MG TA: 25 | 90 days supply | Qty: 90 | Fill #3

## 2020-01-19 MED FILL — PANTOPRAZOLE SOD DR 40 MG T: 40 | 90 days supply | Qty: 90 | Fill #0

## 2020-01-19 MED FILL — OTEZLA 30 MG TABS: 30 | 30 days supply | Qty: 60 | Fill #3

## 2020-01-29 DIAGNOSIS — D23111 Other benign neoplasm of skin of right upper eyelid, including canthus: Secondary | ICD-10-CM | POA: Diagnosis not present

## 2020-02-03 MED FILL — LOSARTAN POTASSIUM 50 MG TA: 50 | 90 days supply | Qty: 90 | Fill #3

## 2020-02-03 MED FILL — ALPRAZolam 0.25 MG TABS: 0.25 | 30 days supply | Qty: 120 | Fill #4

## 2020-02-09 MED FILL — FUROSEMIDE 20 MG TAB: 20 | 90 days supply | Qty: 90 | Fill #5

## 2020-02-11 ENCOUNTER — Other Ambulatory Visit: Payer: Self-pay | Admitting: Pharmacist

## 2020-02-11 MED ORDER — OTEZLA 30 MG PO TABS: 1.0000 | ORAL_TABLET | Freq: Two times a day (BID) | ORAL | 2 refills | Status: DC

## 2020-02-12 MED FILL — OTEZLA 30 MG TABS: 30 | 30 days supply | Qty: 60 | Fill #0

## 2020-02-13 ENCOUNTER — Ambulatory Visit: Payer: 59 | Attending: Internal Medicine

## 2020-02-13 ENCOUNTER — Other Ambulatory Visit (HOSPITAL_BASED_OUTPATIENT_CLINIC_OR_DEPARTMENT_OTHER): Payer: Self-pay | Admitting: Internal Medicine

## 2020-02-13 DIAGNOSIS — Z23 Encounter for immunization: Secondary | ICD-10-CM

## 2020-02-13 NOTE — Progress Notes (Signed)
   Covid-19 Vaccination Clinic  Name:  [name]    MRN: [id number] DOB: 20-Apr-1965  02/13/2020  [name] was observed post Covid-19 immunization for 15 minutes without incident. She was provided with Vaccine Information Sheet and instruction to access the V-Safe system.   [name] was instructed to call 911 with any severe reactions post vaccine: Marland Kitchen Difficulty breathing  . Swelling of face and throat  . A fast heartbeat  . A bad rash all over body  . Dizziness and weakness   Immunizations Administered    Name Date Dose VIS Date Route   Moderna Covid-19 Booster Vaccine 02/13/2020 12:04 PM 0.25 mL 11/26/2019 Intramuscular   Manufacturer: Moderna   Lot: [id number]   [place]: [id number]

## 2020-02-16 MED FILL — MODERNA COVID-19 VACCINE 10: 100 | 28 days supply | Qty: 0 | Fill #0

## 2020-03-11 MED FILL — OTEZLA 30 MG TABS: 30 | 30 days supply | Qty: 60 | Fill #1

## 2020-03-17 MED FILL — ATORVASTATIN 40 MG TABLET: 40 | 90 days supply | Qty: 90 | Fill #1

## 2020-03-23 ENCOUNTER — Other Ambulatory Visit (HOSPITAL_BASED_OUTPATIENT_CLINIC_OR_DEPARTMENT_OTHER): Payer: Self-pay | Admitting: Internal Medicine

## 2020-03-23 MED FILL — SHINGRIX 50 MCG SUS: 50 | 1 days supply | Qty: 1 | Fill #0

## 2020-03-23 NOTE — Progress Notes (Signed)
55 y.o. G0P0000 Single White or Caucasian female here for annual exam.      Feels like she is over the worst of hot flashes Works at [employer] at [employer]  Patient's last menstrual period was 03/01/2013.          Sexually active: No.  The current method of family planning is post menopausal status.    Exercising: Yes.    2-3 times a week Smoker:  no  Health Maintenance: Pap:  01-22-18 neg HPV HR neg History of abnormal Pap:  no MMG:  09-08-2019 category b density birads 1:neg Colonoscopy:  2018 neg BMD:   none TDaP:  2017 Gardasil:   n/a Covid-19: moderna Hep C testing: neg 2020 Screening Labs: screening with PCP   reports that she quit smoking about 21 years ago. Her smoking use included cigarettes. She has never used smokeless tobacco. She reports current alcohol use of about 6.0 - 8.0 standard drinks of alcohol per week. She reports that she does not use drugs.  Past Medical History:  Diagnosis Date  . Anemia    iron def anemia, iron infusion  . Bell's palsy   . Celiac disease   . Fibroid   . GERD (gastroesophageal reflux disease)   . History of pneumonia   . Hypertension   . Iron deficiency anemia   . Menorrhagia   . Psoriasis   . STD (sexually transmitted disease)    HSV1  . Vertigo     Past Surgical History:  Procedure Laterality Date  . APPENDECTOMY      55 years old    Current Outpatient Medications  Medication Sig Dispense Refill  . albuterol (VENTOLIN HFA) 108 (90 Base) MCG/ACT inhaler Inhale 2 puffs into the lungs every 6 (six) hours as needed.    . ALPRAZolam (XANAX) 0.25 MG tablet   2  . Apremilast (OTEZLA) 30 MG TABS Take 1 tablet (30 mg total) by mouth 2 (two) times daily. 60 tablet 2  . atorvastatin (LIPITOR) 40 MG tablet     . Calcium Carbonate-Vitamin D 600-200 MG-UNIT TABS Take 1 tablet by mouth as needed.    . Coenzyme Q10 (CO Q 10 PO) Take by mouth daily.    . furosemide (LASIX) 20 MG tablet     . losartan (COZAAR) 25 MG tablet  Take 1 tablet (25 mg total) by mouth daily. Addition to 50 mg to equal 75 mg 90 tablet 3  . losartan (COZAAR) 50 MG tablet Take 1 tablet (50 mg total) by mouth daily. 90 tablet 3  . Multiple Vitamins-Minerals (MULTIVITAMIN PO) Take by mouth daily.    . pantoprazole (PROTONIX) 40 MG tablet Take 40 mg by mouth daily.     No current facility-administered medications for this visit.    Family History  Problem Relation Age of Onset  . Cancer Mother        lung  . Cancer Brother        testicular  . Heart attack Brother   . Heart failure Father   . Stroke Sister     Review of Systems  Constitutional: Negative.   HENT: Negative.   Eyes: Negative.   Respiratory: Negative.   Cardiovascular: Negative.   Gastrointestinal: Negative.   Endocrine: Negative.   Genitourinary: Negative.   Musculoskeletal: Negative.   Skin: Negative.   Allergic/Immunologic: Negative.   Neurological: Negative.   Hematological: Negative.   Psychiatric/Behavioral: Negative.     Exam:   BP 118/70  Pulse 68   Resp 16   Ht 5' 4"  (1.626 m)   Wt 185 lb (83.9 kg)   LMP 03/01/2013   BMI 31.76 kg/m   Height: 5' 4"  (162.6 cm)  General appearance: alert, cooperative and appears stated age, no acute distress Head: Normocephalic, without obvious abnormality Neck: no adenopathy, thyroid normal to inspection and palpation Lungs: clear to auscultation bilaterally Breasts: No axillary or supraclavicular adenopathy, Normal to palpation without dominant masses Heart: regular rate and rhythm Abdomen: soft, non-tender; no masses,  no organomegaly Extremities: extremities normal, no edema Skin: No rashes or lesions Lymph nodes: Cervical, supraclavicular, and axillary nodes normal. No abnormal inguinal nodes palpated Neurologic: Grossly normal   Pelvic: External genitalia:  no lesions              Urethra:  normal appearing urethra with no masses, tenderness or lesions              Bartholins and Skenes: normal                  Vagina: normal appearing vagina, appropriate for age, normal appearing discharge, no lesions              Cervix: neg cervical motion tenderness, no visible lesions             Bimanual Exam:   Uterus:  normal size, contour, position, consistency, mobility, non-tender              Adnexa: no mass, fullness, tenderness              Rectal: no palpable mass   Lovena Le, CMA Chaperone was present for exam.  A:  Well Woman with normal exam  P:   Pap :last done 2019,due 2024  Mammogram: pt to schedule 09/2020  Labs: with PCP  Medications:  No new

## 2020-03-25 ENCOUNTER — Ambulatory Visit (INDEPENDENT_AMBULATORY_CARE_PROVIDER_SITE_OTHER): Payer: 59 | Admitting: Nurse Practitioner

## 2020-03-25 ENCOUNTER — Encounter: Payer: Self-pay | Admitting: Nurse Practitioner

## 2020-03-25 ENCOUNTER — Other Ambulatory Visit: Payer: Self-pay

## 2020-03-25 VITALS — BP 118/70 | HR 68 | Resp 16 | Ht 64.0 in | Wt 185.0 lb

## 2020-03-25 DIAGNOSIS — Z01419 Encounter for gynecological examination (general) (routine) without abnormal findings: Secondary | ICD-10-CM

## 2020-03-25 NOTE — Patient Instructions (Addendum)
Pap and HPV test last done 2019,due 2024  Mammogram: please schedule 09/2020  Labs: with PCP  Medications:  No new today  Health Maintenance for Postmenopausal Women Menopause is a normal process in which your ability to get pregnant comes to an end. This process happens slowly over many months or years, usually between the ages of 11 and 60. Menopause is complete when you have missed your menstrual periods for 12 months. It is important to talk with your health care provider about some of the most common conditions that affect women after menopause (postmenopausal women). These include heart disease, cancer, and bone loss (osteoporosis). Adopting a healthy lifestyle and getting preventive care can help to promote your health and wellness. The actions you take can also lower your chances of developing some of these common conditions. What should I know about menopause? During menopause, you may get a number of symptoms, such as:  Hot flashes. These can be moderate or severe.  Night sweats.  Decrease in sex drive.  Mood swings.  Headaches.  Tiredness.  Irritability.  Memory problems.  Insomnia. Choosing to treat or not to treat these symptoms is a decision that you make with your health care provider. Do I need hormone replacement therapy?  Hormone replacement therapy is effective in treating symptoms that are caused by menopause, such as hot flashes and night sweats.  Hormone replacement carries certain risks, especially as you become older. If you are thinking about using estrogen or estrogen with progestin, discuss the benefits and risks with your health care provider. What is my risk for heart disease and stroke? The risk of heart disease, heart attack, and stroke increases as you age. One of the causes may be a change in the body's hormones during menopause. This can affect how your body uses dietary fats, triglycerides, and cholesterol. Heart attack and stroke are medical  emergencies. There are many things that you can do to help prevent heart disease and stroke. Watch your blood pressure  High blood pressure causes heart disease and increases the risk of stroke. This is more likely to develop in people who have high blood pressure readings, are of African descent, or are overweight.  Have your blood pressure checked: ? Every 3-5 years if you are 37-38 years of age. ? Every year if you are 78 years old or older. Eat a healthy diet  Eat a diet that includes plenty of vegetables, fruits, low-fat dairy products, and lean protein.  Do not eat a lot of foods that are high in solid fats, added sugars, or sodium.   Get regular exercise Get regular exercise. This is one of the most important things you can do for your health. Most adults should:  Try to exercise for at least 150 minutes each week. The exercise should increase your heart rate and make you sweat (moderate-intensity exercise).  Try to do strengthening exercises at least twice each week. Do these in addition to the moderate-intensity exercise.  Spend less time sitting. Even light physical activity can be beneficial. Other tips  Work with your health care provider to achieve or maintain a healthy weight.  Do not use any products that contain nicotine or tobacco, such as cigarettes, e-cigarettes, and chewing tobacco. If you need help quitting, ask your health care provider.  Know your numbers. Ask your health care provider to check your cholesterol and your blood sugar (glucose). Continue to have your blood tested as directed by your health care provider. Do I need  screening for cancer? Depending on your health history and family history, you may need to have cancer screening at different stages of your life. This may include screening for:  Breast cancer.  Cervical cancer.  Lung cancer.  Colorectal cancer. What is my risk for osteoporosis? After menopause, you may be at increased risk for  osteoporosis. Osteoporosis is a condition in which bone destruction happens more quickly than new bone creation. To help prevent osteoporosis or the bone fractures that can happen because of osteoporosis, you may take the following actions:  If you are 43-60 years old, get at least 1,000 mg of calcium and at least 600 mg of vitamin D per day.  If you are older than age 22 but younger than age 27, get at least 1,200 mg of calcium and at least 600 mg of vitamin D per day.  If you are older than age 67, get at least 1,200 mg of calcium and at least 800 mg of vitamin D per day. Smoking and drinking excessive alcohol increase the risk of osteoporosis. Eat foods that are rich in calcium and vitamin D, and do weight-bearing exercises several times each week as directed by your health care provider. How does menopause affect my mental health? Depression may occur at any age, but it is more common as you become older. Common symptoms of depression include:  Low or sad mood.  Changes in sleep patterns.  Changes in appetite or eating patterns.  Feeling an overall lack of motivation or enjoyment of activities that you previously enjoyed.  Frequent crying spells. Talk with your health care provider if you think that you are experiencing depression. General instructions See your health care provider for regular wellness exams and vaccines. This may include:  Scheduling regular health, dental, and eye exams.  Getting and maintaining your vaccines. These include: ? Influenza vaccine. Get this vaccine each year before the flu season begins. ? Pneumonia vaccine. ? Shingles vaccine. ? Tetanus, diphtheria, and pertussis (Tdap) booster vaccine. Your health care provider may also recommend other immunizations. Tell your health care provider if you have ever been abused or do not feel safe at home. Summary  Menopause is a normal process in which your ability to get pregnant comes to an end.  This  condition causes hot flashes, night sweats, decreased interest in sex, mood swings, headaches, or lack of sleep.  Treatment for this condition may include hormone replacement therapy.  Take actions to keep yourself healthy, including exercising regularly, eating a healthy diet, watching your weight, and checking your blood pressure and blood sugar levels.  Get screened for cancer and depression. Make sure that you are up to date with all your vaccines. This information is not intended to replace advice given to you by your health care provider. Make sure you discuss any questions you have with your health care provider. Document Revised: 01/16/2018 Document Reviewed: 01/16/2018 Elsevier Patient Education  2021 [employer].

## 2020-04-12 MED FILL — OTEZLA 30 MG TABS: 30 | 30 days supply | Qty: 60 | Fill #2

## 2020-04-12 MED FILL — PANTOPRAZOLE SOD DR 40 MG T: 40 | 90 days supply | Qty: 90 | Fill #1

## 2020-04-12 MED FILL — LOSARTAN POTASSIUM 50 MG TA: 50 | 45 days supply | Qty: 90 | Fill #0

## 2020-05-05 ENCOUNTER — Other Ambulatory Visit: Payer: Self-pay | Admitting: Pharmacist

## 2020-05-05 MED ORDER — OTEZLA 30 MG PO TABS: 1.0000 | ORAL_TABLET | Freq: Two times a day (BID) | ORAL | 4 refills | Status: DC

## 2020-05-06 ENCOUNTER — Other Ambulatory Visit (HOSPITAL_COMMUNITY): Payer: Self-pay

## 2020-05-06 MED FILL — OTEZLA 30 MG TABS: 30 | 30 days supply | Qty: 60 | Fill #0

## 2020-05-13 ENCOUNTER — Other Ambulatory Visit (HOSPITAL_BASED_OUTPATIENT_CLINIC_OR_DEPARTMENT_OTHER): Payer: Self-pay

## 2020-05-13 MED FILL — Furosemide Tab 20 MG: ORAL | 30 days supply | Qty: 30 | Fill #0 | Status: AC

## 2020-05-25 DIAGNOSIS — D23111 Other benign neoplasm of skin of right upper eyelid, including canthus: Secondary | ICD-10-CM | POA: Diagnosis not present

## 2020-06-01 ENCOUNTER — Other Ambulatory Visit (HOSPITAL_COMMUNITY): Payer: Self-pay

## 2020-06-01 MED FILL — Apremilast Tab 30 MG: ORAL | 30 days supply | Qty: 60 | Fill #0 | Status: AC

## 2020-06-02 ENCOUNTER — Other Ambulatory Visit (HOSPITAL_COMMUNITY): Payer: Self-pay

## 2020-06-03 ENCOUNTER — Other Ambulatory Visit (HOSPITAL_COMMUNITY): Payer: Self-pay

## 2020-06-03 ENCOUNTER — Other Ambulatory Visit: Payer: Self-pay | Admitting: Pharmacist

## 2020-06-03 DIAGNOSIS — L400 Psoriasis vulgaris: Secondary | ICD-10-CM | POA: Diagnosis not present

## 2020-06-03 DIAGNOSIS — L718 Other rosacea: Secondary | ICD-10-CM | POA: Diagnosis not present

## 2020-06-03 MED ORDER — OTEZLA 30 MG PO TABS
ORAL_TABLET | ORAL | 11 refills | Status: DC
Start: 2020-06-03 — End: 2020-06-03

## 2020-06-03 MED ORDER — OTEZLA 30 MG PO TABS
ORAL_TABLET | ORAL | 11 refills | Status: DC
  Filled 2020-06-03: qty 60, fill #0
  Filled 2020-06-30: qty 60, 30d supply, fill #0
  Filled 2020-07-28: qty 60, 30d supply, fill #1
  Filled 2020-08-23: qty 60, 30d supply, fill #2
  Filled 2020-09-22: qty 60, 30d supply, fill #3
  Filled 2020-10-18: qty 60, 30d supply, fill #4
  Filled 2020-11-15: qty 60, 30d supply, fill #5
  Filled 2020-12-16 – 2020-12-27 (×4): qty 60, 30d supply, fill #6
  Filled 2021-01-20: qty 60, 30d supply, fill #7
  Filled 2021-02-14: qty 60, 30d supply, fill #8
  Filled 2021-03-14: qty 60, 30d supply, fill #9
  Filled 2021-04-12: qty 60, 30d supply, fill #10
  Filled 2021-05-06: qty 60, 30d supply, fill #11

## 2020-06-03 MED ORDER — AZELAIC ACID 15 % EX GEL
CUTANEOUS | 0 refills | Status: AC
  Filled 2020-06-03: qty 50, 50d supply, fill #0
  Filled 2020-06-11: qty 50, 30d supply, fill #0

## 2020-06-04 ENCOUNTER — Other Ambulatory Visit (HOSPITAL_COMMUNITY): Payer: Self-pay

## 2020-06-11 ENCOUNTER — Other Ambulatory Visit (HOSPITAL_BASED_OUTPATIENT_CLINIC_OR_DEPARTMENT_OTHER): Payer: Self-pay

## 2020-06-11 MED FILL — Furosemide Tab 20 MG: ORAL | 90 days supply | Qty: 90 | Fill #1 | Status: AC

## 2020-06-13 MED FILL — Atorvastatin Calcium Tab 40 MG (Base Equivalent): ORAL | 90 days supply | Qty: 90 | Fill #0 | Status: AC

## 2020-06-14 ENCOUNTER — Other Ambulatory Visit (HOSPITAL_BASED_OUTPATIENT_CLINIC_OR_DEPARTMENT_OTHER): Payer: Self-pay

## 2020-06-25 ENCOUNTER — Other Ambulatory Visit (HOSPITAL_BASED_OUTPATIENT_CLINIC_OR_DEPARTMENT_OTHER): Payer: Self-pay

## 2020-06-28 ENCOUNTER — Other Ambulatory Visit (HOSPITAL_COMMUNITY): Payer: Self-pay

## 2020-06-30 ENCOUNTER — Other Ambulatory Visit (HOSPITAL_COMMUNITY): Payer: Self-pay

## 2020-07-11 MED FILL — Pantoprazole Sodium EC Tab 40 MG (Base Equiv): ORAL | 90 days supply | Qty: 90 | Fill #0 | Status: AC

## 2020-07-12 ENCOUNTER — Other Ambulatory Visit (HOSPITAL_BASED_OUTPATIENT_CLINIC_OR_DEPARTMENT_OTHER): Payer: Self-pay

## 2020-07-13 ENCOUNTER — Other Ambulatory Visit (HOSPITAL_BASED_OUTPATIENT_CLINIC_OR_DEPARTMENT_OTHER): Payer: Self-pay

## 2020-07-15 ENCOUNTER — Other Ambulatory Visit (HOSPITAL_BASED_OUTPATIENT_CLINIC_OR_DEPARTMENT_OTHER): Payer: Self-pay

## 2020-07-15 DIAGNOSIS — K644 Residual hemorrhoidal skin tags: Secondary | ICD-10-CM | POA: Diagnosis not present

## 2020-07-15 DIAGNOSIS — K625 Hemorrhage of anus and rectum: Secondary | ICD-10-CM | POA: Diagnosis not present

## 2020-07-15 DIAGNOSIS — K900 Celiac disease: Secondary | ICD-10-CM | POA: Diagnosis not present

## 2020-07-15 DIAGNOSIS — K219 Gastro-esophageal reflux disease without esophagitis: Secondary | ICD-10-CM | POA: Diagnosis not present

## 2020-07-15 DIAGNOSIS — K52832 Lymphocytic colitis: Secondary | ICD-10-CM | POA: Diagnosis not present

## 2020-07-15 MED ORDER — BUDESONIDE 3 MG PO CPEP
ORAL_CAPSULE | ORAL | 12 refills | Status: DC
Start: 2020-07-15 — End: 2021-09-27
  Filled 2020-07-15 – 2020-07-16 (×2): qty 90, 30d supply, fill #0

## 2020-07-16 ENCOUNTER — Other Ambulatory Visit (HOSPITAL_BASED_OUTPATIENT_CLINIC_OR_DEPARTMENT_OTHER): Payer: Self-pay

## 2020-07-25 MED FILL — Losartan Potassium Tab 50 MG: ORAL | 45 days supply | Qty: 90 | Fill #0 | Status: AC

## 2020-07-26 ENCOUNTER — Other Ambulatory Visit (HOSPITAL_BASED_OUTPATIENT_CLINIC_OR_DEPARTMENT_OTHER): Payer: Self-pay

## 2020-07-28 ENCOUNTER — Ambulatory Visit: Payer: 59 | Attending: Family Medicine | Admitting: Pharmacist

## 2020-07-28 ENCOUNTER — Other Ambulatory Visit: Payer: Self-pay

## 2020-07-28 ENCOUNTER — Other Ambulatory Visit (HOSPITAL_COMMUNITY): Payer: Self-pay

## 2020-07-28 DIAGNOSIS — Z79899 Other long term (current) drug therapy: Secondary | ICD-10-CM

## 2020-07-28 NOTE — Progress Notes (Signed)
   S: Patient presents today to the for review of her medication.   Patient is currently taking Otezla for psoriasis. Patient is managed by Dr. Ubaldo Glassing for this.   Adherence: denies any missed doses.   Dose: 30 mg PO BID  Efficacy: reports that it is working well again and works better than other options for her.   Monitoring: - GI Upset: denies  - Headache: denies - Weight loss: denies - S/sx of infection: denies - Mood changes: denies  O:  Lab Results  Component Value Date   WBC 7.8 03/01/2013   HGB 16.0 (H) 03/01/2013   HCT 48.1 (H) 03/01/2013   MCV 88.3 03/01/2013   PLT 217 03/01/2013      Chemistry      Component Value Date/Time   NA 143 03/01/2013 1650   K 3.5 (L) 03/01/2013 1650   CL 100 03/01/2013 1650   CO2 22 03/01/2013 1650   BUN 14 03/01/2013 1650   CREATININE 0.90 03/01/2013 1650      Component Value Date/Time   CALCIUM 9.6 03/01/2013 1650   ALKPHOS 86 03/01/2013 1650   AST 24 03/01/2013 1650   ALT 24 03/01/2013 1650   BILITOT 0.8 03/01/2013 1650       A/P: 1. Medication review: patient currently on Otezla for psoriasis and is tolerating it well with no adverse effects and improved control of her psoriasis. Reviewed the medication with her, including how the medication works, possible adverse effects, and the need for regular follow up with her dermatologist. Patient aware of possible adverse effects such as GI upset, mood changes (depression), and weight loss. No recommendations for any changes.   Benard Halsted, PharmD, [facility], [place] [phone]

## 2020-07-29 ENCOUNTER — Other Ambulatory Visit (HOSPITAL_COMMUNITY): Payer: Self-pay

## 2020-08-23 ENCOUNTER — Other Ambulatory Visit (HOSPITAL_COMMUNITY): Payer: Self-pay

## 2020-08-26 ENCOUNTER — Other Ambulatory Visit (HOSPITAL_COMMUNITY): Payer: Self-pay

## 2020-09-13 ENCOUNTER — Other Ambulatory Visit (HOSPITAL_BASED_OUTPATIENT_CLINIC_OR_DEPARTMENT_OTHER): Payer: Self-pay

## 2020-09-13 MED ORDER — ATORVASTATIN CALCIUM 40 MG PO TABS
ORAL_TABLET | ORAL | 0 refills | Status: DC
  Filled 2020-09-13: qty 30, 30d supply, fill #0

## 2020-09-13 MED ORDER — FUROSEMIDE 20 MG PO TABS
ORAL_TABLET | ORAL | 0 refills | Status: DC
  Filled 2020-09-13: qty 30, 30d supply, fill #0

## 2020-09-13 MED ORDER — LOSARTAN POTASSIUM 50 MG PO TABS
ORAL_TABLET | ORAL | 0 refills | Status: DC
  Filled 2020-09-13 – 2020-09-16 (×3): qty 60, 30d supply, fill #0

## 2020-09-13 MED ORDER — PANTOPRAZOLE SODIUM 40 MG PO TBEC
40.0000 mg | DELAYED_RELEASE_TABLET | Freq: Every day | ORAL | 0 refills | Status: DC
Start: 2020-09-13 — End: 2021-09-27
  Filled 2020-09-13: qty 30, 30d supply, fill #0

## 2020-09-15 ENCOUNTER — Other Ambulatory Visit (HOSPITAL_BASED_OUTPATIENT_CLINIC_OR_DEPARTMENT_OTHER): Payer: Self-pay

## 2020-09-16 ENCOUNTER — Other Ambulatory Visit (HOSPITAL_BASED_OUTPATIENT_CLINIC_OR_DEPARTMENT_OTHER): Payer: Self-pay

## 2020-09-16 MED ORDER — LOSARTAN POTASSIUM 50 MG PO TABS
ORAL_TABLET | ORAL | 0 refills | Status: DC
Start: 2020-09-16 — End: 2021-09-27
  Filled 2020-09-16: qty 90, 45d supply, fill #0

## 2020-09-20 ENCOUNTER — Other Ambulatory Visit (HOSPITAL_COMMUNITY): Payer: Self-pay

## 2020-09-22 ENCOUNTER — Other Ambulatory Visit (HOSPITAL_COMMUNITY): Payer: Self-pay

## 2020-10-07 ENCOUNTER — Other Ambulatory Visit (HOSPITAL_BASED_OUTPATIENT_CLINIC_OR_DEPARTMENT_OTHER): Payer: Self-pay

## 2020-10-07 DIAGNOSIS — Z6830 Body mass index (BMI) 30.0-30.9, adult: Secondary | ICD-10-CM | POA: Diagnosis not present

## 2020-10-07 DIAGNOSIS — I10 Essential (primary) hypertension: Secondary | ICD-10-CM | POA: Diagnosis not present

## 2020-10-07 DIAGNOSIS — E6609 Other obesity due to excess calories: Secondary | ICD-10-CM | POA: Diagnosis not present

## 2020-10-07 DIAGNOSIS — E782 Mixed hyperlipidemia: Secondary | ICD-10-CM | POA: Diagnosis not present

## 2020-10-07 DIAGNOSIS — Z1389 Encounter for screening for other disorder: Secondary | ICD-10-CM | POA: Diagnosis not present

## 2020-10-07 DIAGNOSIS — L409 Psoriasis, unspecified: Secondary | ICD-10-CM | POA: Diagnosis not present

## 2020-10-07 DIAGNOSIS — K219 Gastro-esophageal reflux disease without esophagitis: Secondary | ICD-10-CM | POA: Diagnosis not present

## 2020-10-07 DIAGNOSIS — Z1331 Encounter for screening for depression: Secondary | ICD-10-CM | POA: Diagnosis not present

## 2020-10-07 DIAGNOSIS — Z0001 Encounter for general adult medical examination with abnormal findings: Secondary | ICD-10-CM | POA: Diagnosis not present

## 2020-10-07 MED ORDER — LOSARTAN POTASSIUM 50 MG PO TABS
ORAL_TABLET | ORAL | 3 refills | Status: DC
Start: 2020-10-07 — End: 2021-10-20
  Filled 2020-10-07 – 2020-10-11 (×2): qty 180, 90d supply, fill #0
  Filled 2021-01-10: qty 180, 90d supply, fill #1
  Filled 2021-04-03: qty 180, 90d supply, fill #2
  Filled 2021-07-11: qty 180, 90d supply, fill #3

## 2020-10-07 MED ORDER — FUROSEMIDE 20 MG PO TABS
ORAL_TABLET | ORAL | 3 refills | Status: DC
Start: 2020-10-07 — End: 2022-06-09
  Filled 2020-10-07: qty 90, 90d supply, fill #0
  Filled 2021-01-11: qty 90, 90d supply, fill #1
  Filled 2021-04-03: qty 90, 90d supply, fill #2
  Filled 2021-07-11: qty 90, 90d supply, fill #3

## 2020-10-07 MED ORDER — PANTOPRAZOLE SODIUM 40 MG PO TBEC
40.0000 mg | DELAYED_RELEASE_TABLET | Freq: Every day | ORAL | 3 refills | Status: DC
Start: 2020-10-07 — End: 2022-12-14
  Filled 2020-10-07: qty 90, 90d supply, fill #0
  Filled 2021-01-10: qty 90, 90d supply, fill #1
  Filled 2021-04-03: qty 90, 90d supply, fill #2
  Filled 2021-07-11: qty 90, 90d supply, fill #3

## 2020-10-07 MED ORDER — ATORVASTATIN CALCIUM 40 MG PO TABS
40.0000 mg | ORAL_TABLET | Freq: Every day | ORAL | 3 refills | Status: DC
Start: 2020-10-07 — End: 2023-12-18
  Filled 2020-10-07: qty 90, 90d supply, fill #0

## 2020-10-07 MED ORDER — ALPRAZOLAM 0.25 MG PO TABS
ORAL_TABLET | ORAL | 4 refills | Status: DC
Start: 2020-10-07 — End: 2021-09-27
  Filled 2020-10-07: qty 120, 30d supply, fill #0
  Filled 2020-11-09: qty 120, 30d supply, fill #1
  Filled 2020-12-09: qty 120, 30d supply, fill #2
  Filled 2021-01-10: qty 120, 30d supply, fill #3
  Filled 2021-02-15: qty 120, 30d supply, fill #4

## 2020-10-12 ENCOUNTER — Other Ambulatory Visit (HOSPITAL_COMMUNITY): Payer: Self-pay | Admitting: Internal Medicine

## 2020-10-12 ENCOUNTER — Other Ambulatory Visit (HOSPITAL_BASED_OUTPATIENT_CLINIC_OR_DEPARTMENT_OTHER): Payer: Self-pay

## 2020-10-12 ENCOUNTER — Other Ambulatory Visit: Payer: Self-pay | Admitting: Internal Medicine

## 2020-10-12 DIAGNOSIS — R7989 Other specified abnormal findings of blood chemistry: Secondary | ICD-10-CM

## 2020-10-12 DIAGNOSIS — R945 Abnormal results of liver function studies: Secondary | ICD-10-CM

## 2020-10-18 ENCOUNTER — Other Ambulatory Visit (HOSPITAL_COMMUNITY): Payer: Self-pay

## 2020-10-19 ENCOUNTER — Other Ambulatory Visit (HOSPITAL_BASED_OUTPATIENT_CLINIC_OR_DEPARTMENT_OTHER): Payer: Self-pay

## 2020-10-19 MED ORDER — CARESTART COVID-19 HOME TEST VI KIT
PACK | 0 refills | Status: DC
  Filled 2020-10-19: qty 2, 4d supply, fill #0

## 2020-10-20 ENCOUNTER — Other Ambulatory Visit (HOSPITAL_COMMUNITY): Payer: Self-pay

## 2020-10-20 ENCOUNTER — Other Ambulatory Visit (HOSPITAL_BASED_OUTPATIENT_CLINIC_OR_DEPARTMENT_OTHER): Payer: Self-pay

## 2020-10-20 MED ORDER — PAXLOVID (300/100) 20 X 150 MG & 10 X 100MG PO TBPK
3.0000 | ORAL_TABLET | Freq: Two times a day (BID) | ORAL | 0 refills | Status: DC
Start: 2020-10-20 — End: 2021-09-27
  Filled 2020-10-20: qty 30, 5d supply, fill #0

## 2020-10-21 ENCOUNTER — Other Ambulatory Visit (HOSPITAL_COMMUNITY): Payer: Self-pay

## 2020-10-21 ENCOUNTER — Other Ambulatory Visit (HOSPITAL_BASED_OUTPATIENT_CLINIC_OR_DEPARTMENT_OTHER): Payer: Self-pay

## 2020-10-21 MED ORDER — HYDROCOD POLST-CPM POLST ER 10-8 MG/5ML PO SUER
ORAL | 0 refills | Status: DC
Start: 2020-10-21 — End: 2021-09-27
  Filled 2020-10-21: qty 118, 12d supply, fill #0

## 2020-10-28 ENCOUNTER — Other Ambulatory Visit (HOSPITAL_BASED_OUTPATIENT_CLINIC_OR_DEPARTMENT_OTHER): Payer: Self-pay | Admitting: Internal Medicine

## 2020-10-28 DIAGNOSIS — Z1231 Encounter for screening mammogram for malignant neoplasm of breast: Secondary | ICD-10-CM

## 2020-11-09 ENCOUNTER — Other Ambulatory Visit (HOSPITAL_BASED_OUTPATIENT_CLINIC_OR_DEPARTMENT_OTHER): Payer: Self-pay

## 2020-11-11 IMAGING — MG DIGITAL SCREENING BILAT W/ TOMO W/ CAD
6 of 10 series · 6 of 30 positions shown · non-contrast
Comparison: Previous exam(s).

CLINICAL DATA: Screening.

EXAM:
DIGITAL SCREENING BILATERAL MAMMOGRAM WITH TOMO AND CAD

[R CC synth-2D]
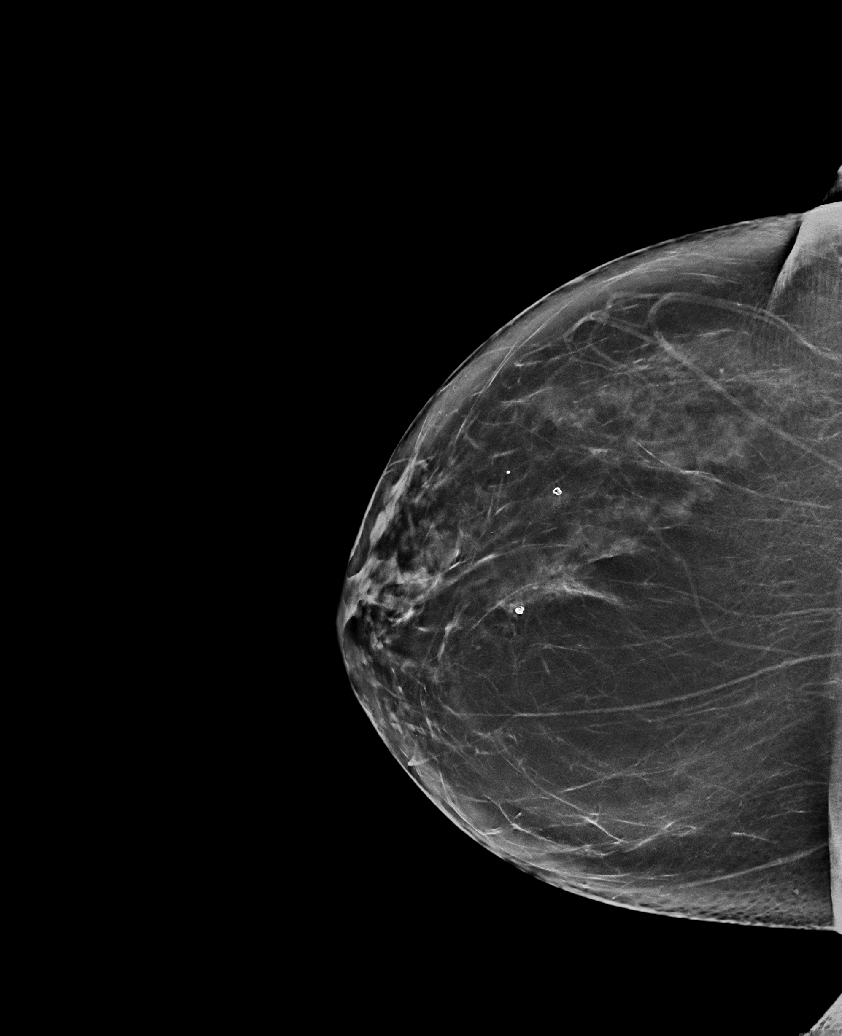

[L MLO synth-2D]
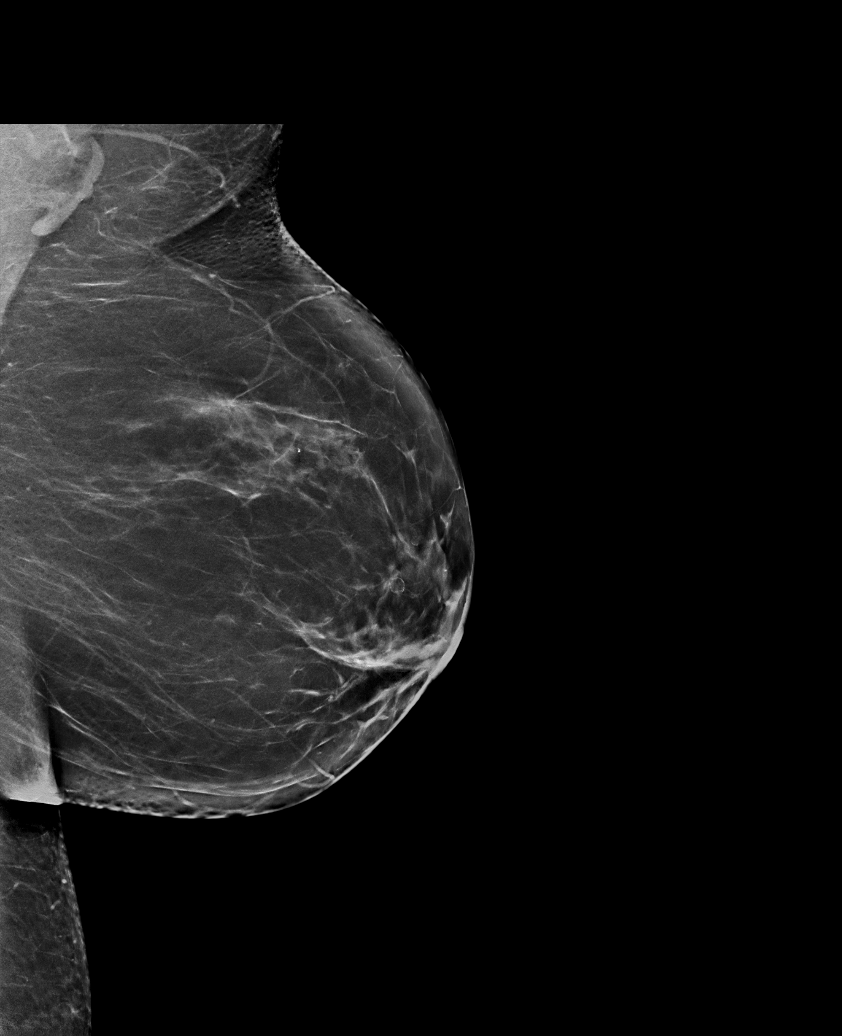

[R XCCL synth-2D]
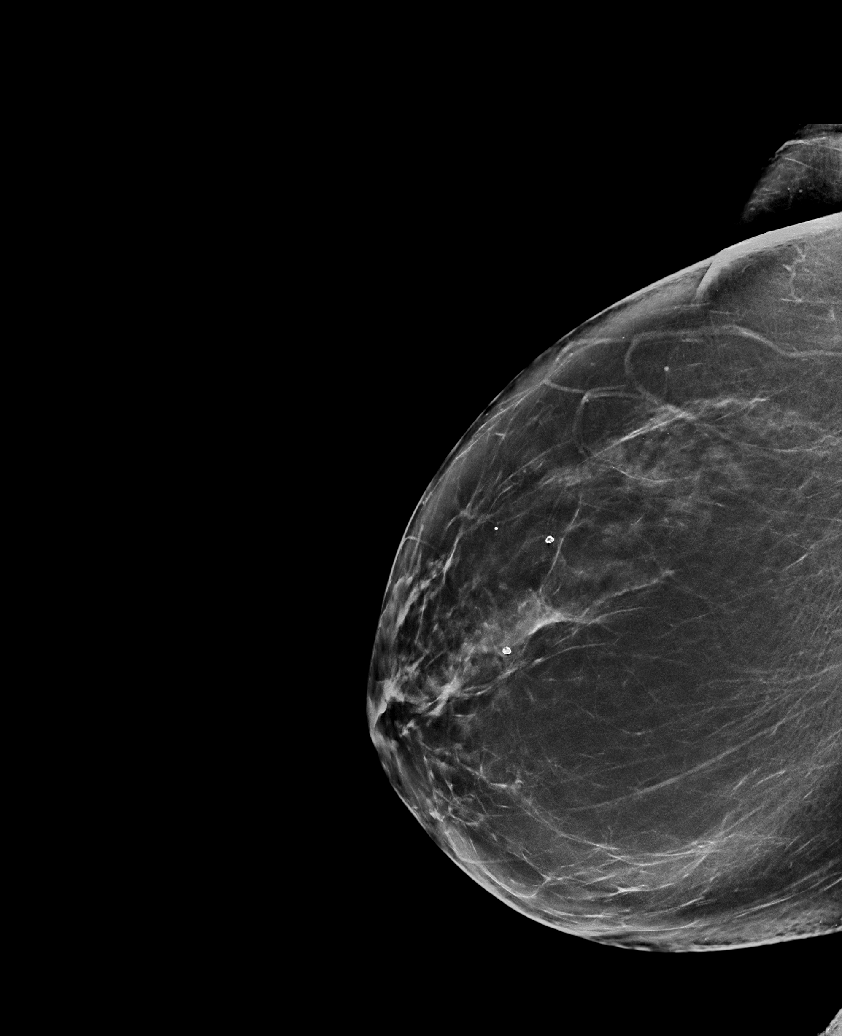

[L CC synth-2D]
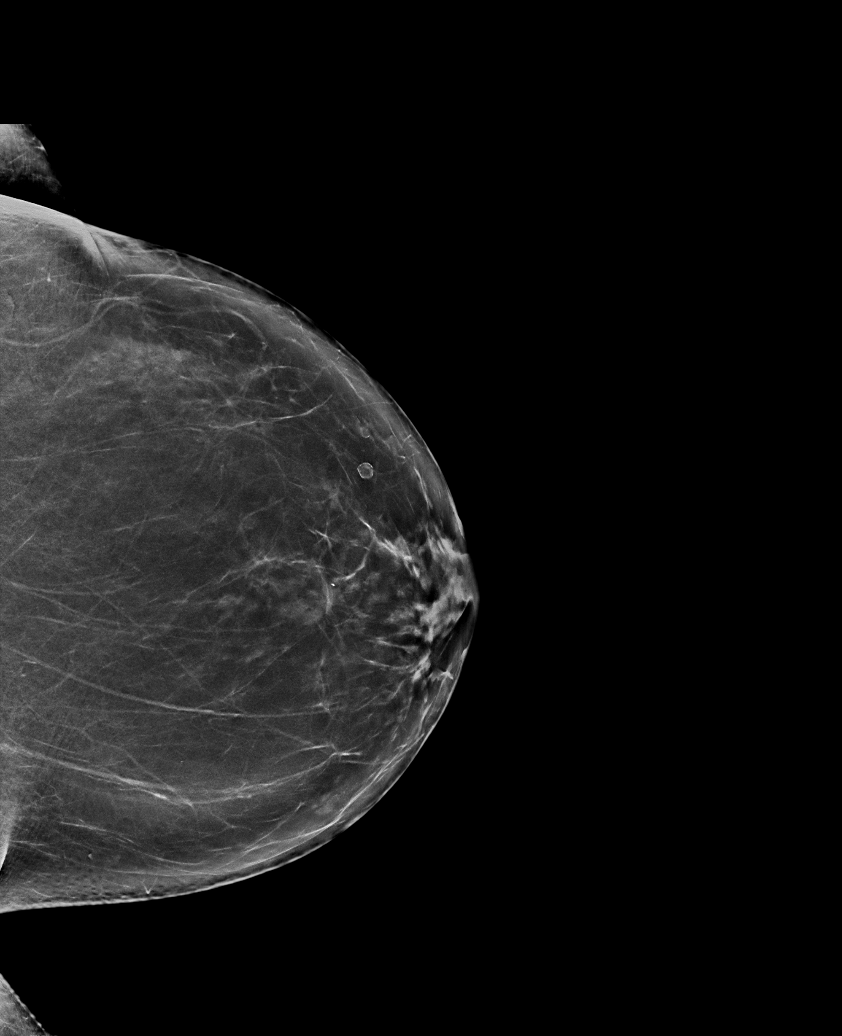

[R MLO synth-2D]
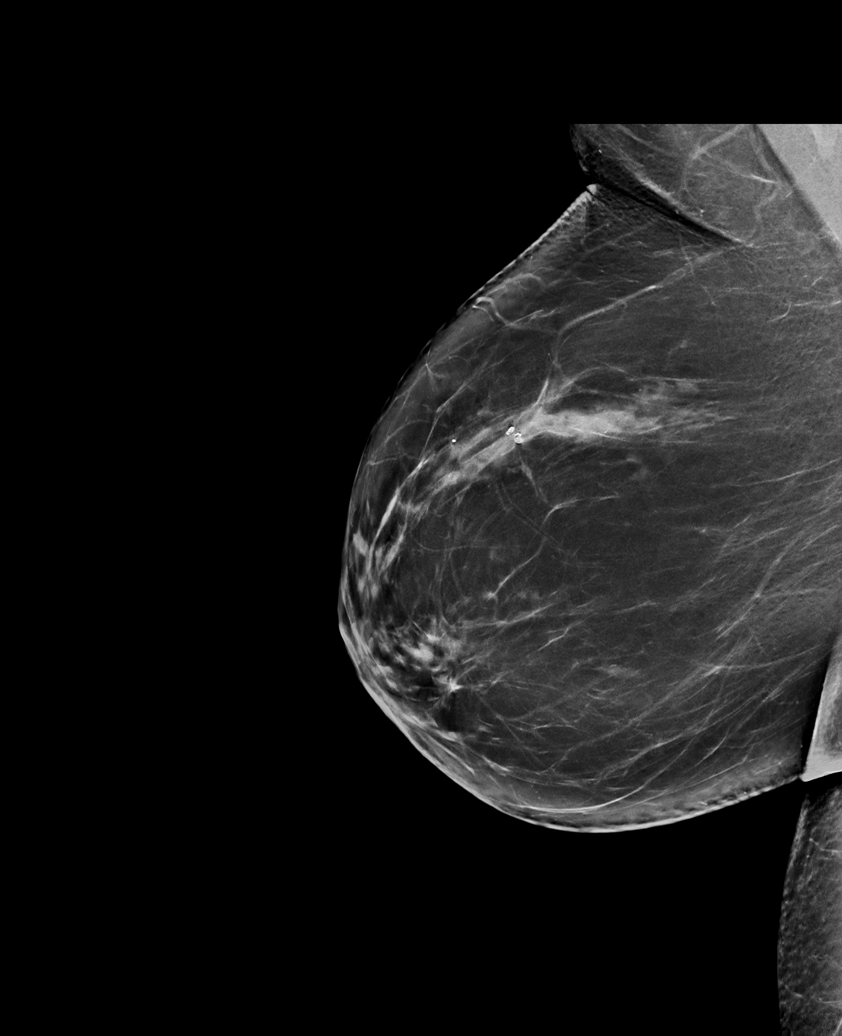

[L MLO tomo · tomo slice 41/82.0]
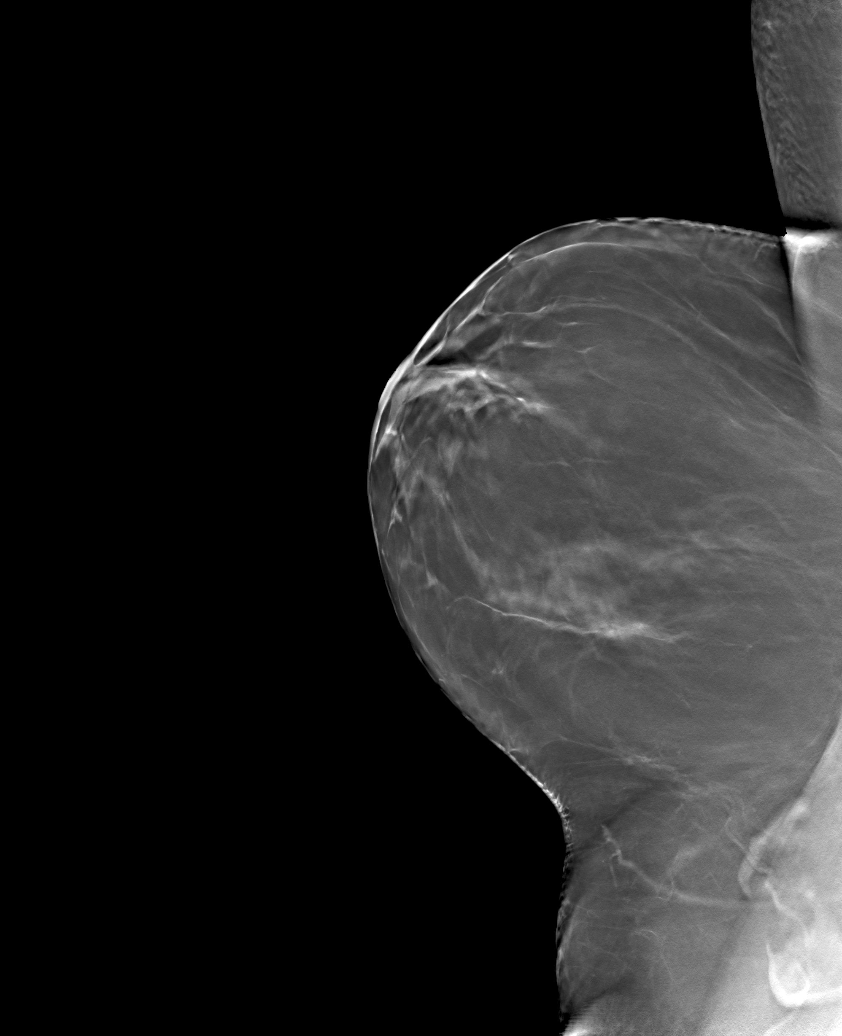

[6 of 30 positions shown; findings below may reference images not displayed]

ACR Breast Density Category b: There are scattered areas of
fibroglandular density.
FINDINGS: There are no findings suspicious for malignancy. Images were
processed with CAD.
IMPRESSION: No mammographic evidence of malignancy. A result letter of this
screening mammogram will be mailed directly to the patient.

RECOMMENDATION:
Screening mammogram in one year. (Code:[id number])

BI-RADS CATEGORY  1: Negative.

## 2020-11-15 ENCOUNTER — Other Ambulatory Visit (HOSPITAL_COMMUNITY): Payer: Self-pay

## 2020-11-18 ENCOUNTER — Other Ambulatory Visit (HOSPITAL_COMMUNITY): Payer: Self-pay

## 2020-11-30 ENCOUNTER — Encounter (HOSPITAL_BASED_OUTPATIENT_CLINIC_OR_DEPARTMENT_OTHER): Payer: Self-pay

## 2020-11-30 ENCOUNTER — Other Ambulatory Visit: Payer: Self-pay

## 2020-11-30 ENCOUNTER — Ambulatory Visit (HOSPITAL_BASED_OUTPATIENT_CLINIC_OR_DEPARTMENT_OTHER)
Admission: RE | Admit: 2020-11-30 | Discharge: 2020-11-30 | Disposition: A | Discharge status: Home / Self Care | Payer: 59 | Source: Ambulatory Visit | Attending: Internal Medicine | Admitting: Internal Medicine

## 2020-11-30 ENCOUNTER — Other Ambulatory Visit (HOSPITAL_BASED_OUTPATIENT_CLINIC_OR_DEPARTMENT_OTHER): Payer: Self-pay

## 2020-11-30 DIAGNOSIS — Z1231 Encounter for screening mammogram for malignant neoplasm of breast: Secondary | ICD-10-CM

## 2020-11-30 MED ORDER — INFLUENZA VAC SPLIT QUAD 0.5 ML IM SUSY
PREFILLED_SYRINGE | INTRAMUSCULAR | 0 refills | Status: DC
  Filled 2020-11-30: qty 0.5, 1d supply, fill #0

## 2020-12-02 DIAGNOSIS — L570 Actinic keratosis: Secondary | ICD-10-CM | POA: Diagnosis not present

## 2020-12-02 DIAGNOSIS — L400 Psoriasis vulgaris: Secondary | ICD-10-CM | POA: Diagnosis not present

## 2020-12-02 DIAGNOSIS — L814 Other melanin hyperpigmentation: Secondary | ICD-10-CM | POA: Diagnosis not present

## 2020-12-02 DIAGNOSIS — L718 Other rosacea: Secondary | ICD-10-CM | POA: Diagnosis not present

## 2020-12-02 DIAGNOSIS — L821 Other seborrheic keratosis: Secondary | ICD-10-CM | POA: Diagnosis not present

## 2020-12-02 DIAGNOSIS — D1801 Hemangioma of skin and subcutaneous tissue: Secondary | ICD-10-CM | POA: Diagnosis not present

## 2020-12-09 ENCOUNTER — Other Ambulatory Visit (HOSPITAL_BASED_OUTPATIENT_CLINIC_OR_DEPARTMENT_OTHER): Payer: Self-pay

## 2020-12-14 ENCOUNTER — Other Ambulatory Visit (HOSPITAL_COMMUNITY): Payer: Self-pay

## 2020-12-16 ENCOUNTER — Other Ambulatory Visit (HOSPITAL_COMMUNITY): Payer: Self-pay

## 2020-12-20 ENCOUNTER — Other Ambulatory Visit (HOSPITAL_COMMUNITY): Payer: Self-pay

## 2020-12-20 DIAGNOSIS — H00014 Hordeolum externum left upper eyelid: Secondary | ICD-10-CM | POA: Diagnosis not present

## 2020-12-20 DIAGNOSIS — H10412 Chronic giant papillary conjunctivitis, left eye: Secondary | ICD-10-CM | POA: Diagnosis not present

## 2020-12-20 MED ORDER — AZITHROMYCIN 250 MG PO TABS
ORAL_TABLET | ORAL | 0 refills | Status: DC
  Filled 2020-12-20: qty 6, 5d supply, fill #0

## 2020-12-20 MED ORDER — TOBRAMYCIN-DEXAMETHASONE 0.3-0.1 % OP SUSP
OPHTHALMIC | 0 refills | Status: DC
  Filled 2020-12-20: qty 2.5, 14d supply, fill #0

## 2020-12-27 ENCOUNTER — Other Ambulatory Visit (HOSPITAL_COMMUNITY): Payer: Self-pay

## 2020-12-28 ENCOUNTER — Other Ambulatory Visit (HOSPITAL_COMMUNITY): Payer: Self-pay

## 2021-01-10 ENCOUNTER — Other Ambulatory Visit (HOSPITAL_BASED_OUTPATIENT_CLINIC_OR_DEPARTMENT_OTHER): Payer: Self-pay

## 2021-01-12 ENCOUNTER — Other Ambulatory Visit (HOSPITAL_BASED_OUTPATIENT_CLINIC_OR_DEPARTMENT_OTHER): Payer: Self-pay

## 2021-01-17 ENCOUNTER — Other Ambulatory Visit (HOSPITAL_COMMUNITY): Payer: Self-pay

## 2021-01-20 ENCOUNTER — Other Ambulatory Visit (HOSPITAL_COMMUNITY): Payer: Self-pay

## 2021-02-09 ENCOUNTER — Ambulatory Visit: Payer: 59 | Attending: Internal Medicine

## 2021-02-09 DIAGNOSIS — Z23 Encounter for immunization: Secondary | ICD-10-CM

## 2021-02-10 ENCOUNTER — Other Ambulatory Visit (HOSPITAL_BASED_OUTPATIENT_CLINIC_OR_DEPARTMENT_OTHER): Payer: Self-pay

## 2021-02-10 MED ORDER — MODERNA COVID-19 BIVAL BOOSTER 50 MCG/0.5ML IM SUSP
INTRAMUSCULAR | 0 refills | Status: DC
  Filled 2021-02-10: qty 0.5, 1d supply, fill #0

## 2021-02-10 NOTE — Progress Notes (Signed)
° °  Covid-19 Vaccination Clinic  Name:  [name]    MRN: [id number] DOB: [birth year]  02/10/2021  [name] was observed post Covid-19 immunization for 15 minutes without incident. She was provided with Vaccine Information Sheet and instruction to access the V-Safe system.   [name] was instructed to call 911 with any severe reactions post vaccine: Difficulty breathing  Swelling of face and throat  A fast heartbeat  A bad rash all over body  Dizziness and weakness   Immunizations Administered     Name Date Dose VIS Date Route   Moderna Covid-19 vaccine Bivalent Booster 02/09/2021  1:59 PM 0.5 mL 09/18/2020 Intramuscular   Manufacturer: Levan Hurst   Lot: [id number]   [place]: [phone]

## 2021-02-14 ENCOUNTER — Other Ambulatory Visit (HOSPITAL_COMMUNITY): Payer: Self-pay

## 2021-02-15 ENCOUNTER — Other Ambulatory Visit (HOSPITAL_BASED_OUTPATIENT_CLINIC_OR_DEPARTMENT_OTHER): Payer: Self-pay

## 2021-02-16 ENCOUNTER — Other Ambulatory Visit (HOSPITAL_COMMUNITY): Payer: Self-pay

## 2021-03-14 ENCOUNTER — Other Ambulatory Visit (HOSPITAL_COMMUNITY): Payer: Self-pay

## 2021-03-16 ENCOUNTER — Other Ambulatory Visit (HOSPITAL_COMMUNITY): Payer: Self-pay

## 2021-04-04 ENCOUNTER — Other Ambulatory Visit (HOSPITAL_BASED_OUTPATIENT_CLINIC_OR_DEPARTMENT_OTHER): Payer: Self-pay

## 2021-04-12 ENCOUNTER — Other Ambulatory Visit (HOSPITAL_COMMUNITY): Payer: Self-pay

## 2021-04-13 ENCOUNTER — Other Ambulatory Visit (HOSPITAL_COMMUNITY): Payer: Self-pay

## 2021-05-06 ENCOUNTER — Other Ambulatory Visit (HOSPITAL_COMMUNITY): Payer: Self-pay

## 2021-05-09 ENCOUNTER — Other Ambulatory Visit (HOSPITAL_COMMUNITY): Payer: Self-pay

## 2021-05-25 ENCOUNTER — Other Ambulatory Visit (HOSPITAL_COMMUNITY): Payer: Self-pay

## 2021-06-02 ENCOUNTER — Other Ambulatory Visit: Payer: Self-pay | Admitting: Pharmacist

## 2021-06-02 ENCOUNTER — Other Ambulatory Visit (HOSPITAL_COMMUNITY): Payer: Self-pay

## 2021-06-02 MED ORDER — OTEZLA 30 MG PO TABS: ORAL_TABLET | ORAL | 5 refills | Status: DC

## 2021-06-02 MED ORDER — OTEZLA 30 MG PO TABS
ORAL_TABLET | ORAL | 5 refills | Status: DC
  Filled 2021-06-02: qty 60, 30d supply, fill #0
  Filled 2021-06-28: qty 60, 30d supply, fill #1
  Filled 2021-07-26: qty 60, 30d supply, fill #2
  Filled 2021-08-19: qty 60, 30d supply, fill #3
  Filled 2021-09-16: qty 60, 30d supply, fill #4
  Filled 2021-10-14: qty 60, 30d supply, fill #5

## 2021-06-06 ENCOUNTER — Other Ambulatory Visit (HOSPITAL_COMMUNITY): Payer: Self-pay

## 2021-06-28 ENCOUNTER — Other Ambulatory Visit (HOSPITAL_COMMUNITY): Payer: Self-pay

## 2021-07-05 ENCOUNTER — Other Ambulatory Visit (HOSPITAL_COMMUNITY): Payer: Self-pay

## 2021-07-11 ENCOUNTER — Other Ambulatory Visit (HOSPITAL_BASED_OUTPATIENT_CLINIC_OR_DEPARTMENT_OTHER): Payer: Self-pay

## 2021-07-25 ENCOUNTER — Ambulatory Visit: Payer: Self-pay | Attending: Internal Medicine | Admitting: Pharmacist

## 2021-07-25 DIAGNOSIS — Z79899 Other long term (current) drug therapy: Secondary | ICD-10-CM

## 2021-07-25 NOTE — Progress Notes (Signed)
   S: Patient presents today to the for review of her medication.   Patient is currently taking Otezla for psoriasis. Patient is managed by [facility] for this.   Adherence: denies any missed doses.   Dose: 30 mg PO BID  Efficacy: reports that it is working well for her.   Monitoring: - GI Upset: denies  - Headache: denies - Weight loss: denies - S/sx of infection: denies - Mood changes: denies  O:  Lab Results  Component Value Date   WBC 7.8 03/01/2013   HGB 16.0 (H) 03/01/2013   HCT 48.1 (H) 03/01/2013   MCV 88.3 03/01/2013   PLT 217 03/01/2013      Chemistry      Component Value Date/Time   NA 143 03/01/2013 1650   K 3.5 (L) 03/01/2013 1650   CL 100 03/01/2013 1650   CO2 22 03/01/2013 1650   BUN 14 03/01/2013 1650   CREATININE 0.90 03/01/2013 1650      Component Value Date/Time   CALCIUM 9.6 03/01/2013 1650   ALKPHOS 86 03/01/2013 1650   AST 24 03/01/2013 1650   ALT 24 03/01/2013 1650   BILITOT 0.8 03/01/2013 1650       A/P: 1. Medication review: patient currently on Otezla for psoriasis and is tolerating it well with no adverse effects and improved control of her psoriasis. Reviewed the medication with her, including how the medication works, possible adverse effects, and the need for regular follow up with her dermatologist. Patient aware of possible adverse effects such as GI upset, mood changes (depression), and weight loss. No recommendations for any changes.   Benard Halsted, PharmD, [place], [place] [phone]

## 2021-07-26 ENCOUNTER — Other Ambulatory Visit (HOSPITAL_COMMUNITY): Payer: Self-pay

## 2021-07-28 ENCOUNTER — Other Ambulatory Visit (HOSPITAL_COMMUNITY): Payer: Self-pay

## 2021-08-01 ENCOUNTER — Other Ambulatory Visit (HOSPITAL_COMMUNITY): Payer: Self-pay

## 2021-08-18 ENCOUNTER — Other Ambulatory Visit (HOSPITAL_COMMUNITY): Payer: Self-pay

## 2021-08-19 ENCOUNTER — Other Ambulatory Visit (HOSPITAL_COMMUNITY): Payer: Self-pay

## 2021-08-29 ENCOUNTER — Other Ambulatory Visit (HOSPITAL_COMMUNITY): Payer: Self-pay

## 2021-09-16 ENCOUNTER — Other Ambulatory Visit (HOSPITAL_COMMUNITY): Payer: Self-pay

## 2021-09-26 ENCOUNTER — Other Ambulatory Visit (HOSPITAL_COMMUNITY): Payer: Self-pay

## 2021-09-27 ENCOUNTER — Encounter: Payer: Self-pay | Admitting: Radiology

## 2021-09-27 ENCOUNTER — Ambulatory Visit (INDEPENDENT_AMBULATORY_CARE_PROVIDER_SITE_OTHER): Payer: No Typology Code available for payment source | Admitting: Radiology

## 2021-09-27 VITALS — BP 132/84 | Ht 64.0 in | Wt 190.0 lb

## 2021-09-27 DIAGNOSIS — Z01419 Encounter for gynecological examination (general) (routine) without abnormal findings: Secondary | ICD-10-CM | POA: Diagnosis not present

## 2021-09-27 NOTE — Progress Notes (Signed)
   [name] 01-29-1966 [id number]   History: Postmenopausal 56 y.o. presents for annual exam. No gyn concerns. Not sexually active.    Gynecologic History Postmenopausal Last Pap: 2019. Results were: normal Last mammogram: 10/22. Results were: normal Last colonoscopy: 12/2016 HRT use: no  Obstetric History OB History  Gravida Para Term Preterm AB Living  0 0 0 0 0 0  SAB IAB Ectopic Multiple Live Births  0 0 0 0       The following portions of the patient's history were reviewed and updated as appropriate: allergies, current medications, past family history, past medical history, past social history, past surgical history, and problem list.  Review of Systems Pertinent items noted in HPI and remainder of comprehensive ROS otherwise negative.  Past medical history, past surgical history, family history and social history were all reviewed and documented in the EPIC chart.  Exam:  Vitals:   09/27/21 1333  BP: 132/84  Weight: 190 lb (86.2 kg)  Height: 5' 4"  (1.626 m)   Body mass index is 32.61 kg/m.  General appearance:  Normal Thyroid:  Symmetrical, normal in size, without palpable masses or nodularity. Respiratory  Auscultation:  Clear without wheezing or rhonchi Cardiovascular  Auscultation:  Regular rate, without rubs, murmurs or gallops  Edema/varicosities:  Not grossly evident Abdominal  Soft,nontender, without masses, guarding or rebound.  Liver/spleen:  No organomegaly noted  Hernia:  None appreciated  Skin  Inspection:  Grossly normal Breasts: Examined lying and sitting.   Right: Without masses, retractions, nipple discharge or axillary adenopathy.   Left: Without masses, retractions, nipple discharge or axillary adenopathy. Genitourinary   Inguinal/mons:  Normal without inguinal adenopathy  External genitalia:  Normal appearing vulva with no masses, tenderness, or lesions  BUS/Urethra/Skene's glands:  Normal  Vagina:  Normal appearing with  normal color and discharge, no lesions. Atrophy: mild   Cervix:  Normal appearing without discharge or lesions  Uterus:  Normal in size, shape and contour.  Midline and mobile, nontender  Adnexa/parametria:     Rt: Normal in size, without masses or tenderness.   Lt: Normal in size, without masses or tenderness.  Anus and perineum: Normal    Patient informed chaperone available to be present for breast and pelvic exam. Patient has requested no chaperone to be present. Patient has been advised what will be completed during breast and pelvic exam.   Assessment/Plan:   1. Well woman exam with routine gynecological exam Pap 2024    Discussed SBE, colonoscopy and DEXA screening as directed. Recommend 140mns of exercise weekly, including weight bearing exercise. Encouraged the use of seatbelts and sunscreen.  Return in 1 year for annual or sooner prn.  [name] B WHNP-BC, 1:51 PM 09/27/2021

## 2021-10-14 ENCOUNTER — Other Ambulatory Visit (HOSPITAL_COMMUNITY): Payer: Self-pay

## 2021-10-17 ENCOUNTER — Other Ambulatory Visit (HOSPITAL_COMMUNITY): Payer: Self-pay

## 2021-10-17 MED ORDER — ALBUTEROL SULFATE HFA 108 (90 BASE) MCG/ACT IN AERS
INHALATION_SPRAY | RESPIRATORY_TRACT | 1 refills | Status: DC
  Filled 2021-10-17: qty 6.7, 25d supply, fill #0
  Filled 2021-12-09: qty 6.7, 25d supply, fill #1

## 2021-10-17 MED ORDER — OPTICHAMBER DIAMOND MISC
0 refills | Status: AC
Start: 2021-10-17 — End: ?
  Filled 2021-10-17: qty 1, 30d supply, fill #0

## 2021-10-17 MED ORDER — GUAIFENESIN AC 100-10 MG/5ML PO SYRP
ORAL_SOLUTION | ORAL | 0 refills | Status: DC
  Filled 2021-10-17: qty 240, 6d supply, fill #0

## 2021-10-17 MED ORDER — PAXLOVID (300/100) 20 X 150 MG & 10 X 100MG PO TBPK
ORAL_TABLET | ORAL | 0 refills | Status: DC
  Filled 2021-10-17: qty 30, 5d supply, fill #0

## 2021-10-17 MED ORDER — BENZONATATE 200 MG PO CAPS
200.0000 mg | ORAL_CAPSULE | Freq: Three times a day (TID) | ORAL | 0 refills | Status: DC
  Filled 2021-10-17: qty 30, 10d supply, fill #0

## 2021-10-20 ENCOUNTER — Other Ambulatory Visit (HOSPITAL_BASED_OUTPATIENT_CLINIC_OR_DEPARTMENT_OTHER): Payer: Self-pay

## 2021-10-20 MED ORDER — LOSARTAN POTASSIUM 50 MG PO TABS
ORAL_TABLET | ORAL | 3 refills | Status: DC
  Filled 2021-10-20: qty 180, 90d supply, fill #0
  Filled 2022-01-16: qty 180, 90d supply, fill #1
  Filled 2022-04-17: qty 180, 90d supply, fill #2
  Filled 2022-07-16: qty 180, 90d supply, fill #3

## 2021-10-24 ENCOUNTER — Other Ambulatory Visit (HOSPITAL_COMMUNITY): Payer: Self-pay

## 2021-11-03 ENCOUNTER — Other Ambulatory Visit (HOSPITAL_BASED_OUTPATIENT_CLINIC_OR_DEPARTMENT_OTHER): Payer: Self-pay

## 2021-11-03 MED ORDER — FAMOTIDINE 40 MG PO TABS
40.0000 mg | ORAL_TABLET | Freq: Every day | ORAL | 3 refills | Status: DC
  Filled 2021-11-03: qty 90, 90d supply, fill #0
  Filled 2022-01-16: qty 90, 90d supply, fill #1
  Filled 2022-04-17: qty 90, 90d supply, fill #2
  Filled 2022-07-16: qty 90, 90d supply, fill #3

## 2021-11-03 MED ORDER — ALPRAZOLAM 0.25 MG PO TABS
0.2500 mg | ORAL_TABLET | Freq: Four times a day (QID) | ORAL | 2 refills | Status: DC
  Filled 2021-11-03: qty 120, 30d supply, fill #0
  Filled 2021-12-09: qty 120, 30d supply, fill #1
  Filled 2022-01-16: qty 120, 30d supply, fill #2

## 2021-11-03 MED ORDER — FUROSEMIDE 20 MG PO TABS
20.0000 mg | ORAL_TABLET | Freq: Every day | ORAL | 3 refills | Status: DC
  Filled 2021-11-03: qty 90, 90d supply, fill #0

## 2021-11-03 MED ORDER — LOSARTAN POTASSIUM 50 MG PO TABS
50.0000 mg | ORAL_TABLET | Freq: Two times a day (BID) | ORAL | 3 refills | Status: DC
  Filled 2021-11-03: qty 180, 90d supply, fill #0

## 2021-11-07 ENCOUNTER — Other Ambulatory Visit (HOSPITAL_BASED_OUTPATIENT_CLINIC_OR_DEPARTMENT_OTHER): Payer: Self-pay

## 2021-11-07 MED ORDER — ATORVASTATIN CALCIUM 40 MG PO TABS
40.0000 mg | ORAL_TABLET | Freq: Every day | ORAL | 3 refills | Status: DC
  Filled 2021-11-29: qty 90, 90d supply, fill #0
  Filled 2022-04-17: qty 90, 90d supply, fill #1

## 2021-11-10 ENCOUNTER — Other Ambulatory Visit (HOSPITAL_COMMUNITY): Payer: Self-pay

## 2021-11-11 ENCOUNTER — Other Ambulatory Visit: Payer: Self-pay | Admitting: Pharmacist

## 2021-11-11 ENCOUNTER — Other Ambulatory Visit (HOSPITAL_COMMUNITY): Payer: Self-pay

## 2021-11-11 MED ORDER — OTEZLA 30 MG PO TABS
ORAL_TABLET | ORAL | 5 refills | Status: DC
  Filled 2021-11-11: qty 60, 30d supply, fill #0
  Filled 2021-12-12: qty 60, 30d supply, fill #1
  Filled 2022-01-10 – 2022-01-18 (×3): qty 60, 30d supply, fill #2
  Filled 2022-02-09: qty 60, 30d supply, fill #3
  Filled 2022-03-10: qty 60, 30d supply, fill #4
  Filled 2022-04-11: qty 60, 30d supply, fill #5

## 2021-11-11 MED ORDER — OTEZLA 30 MG PO TABS: ORAL_TABLET | ORAL | 5 refills | Status: DC

## 2021-11-17 ENCOUNTER — Other Ambulatory Visit (HOSPITAL_COMMUNITY): Payer: Self-pay

## 2021-11-21 ENCOUNTER — Other Ambulatory Visit (HOSPITAL_COMMUNITY): Payer: Self-pay

## 2021-11-23 ENCOUNTER — Other Ambulatory Visit (HOSPITAL_COMMUNITY): Payer: Self-pay

## 2021-11-29 ENCOUNTER — Other Ambulatory Visit (HOSPITAL_BASED_OUTPATIENT_CLINIC_OR_DEPARTMENT_OTHER): Payer: Self-pay

## 2021-11-29 ENCOUNTER — Other Ambulatory Visit (HOSPITAL_BASED_OUTPATIENT_CLINIC_OR_DEPARTMENT_OTHER): Payer: Self-pay | Admitting: Internal Medicine

## 2021-11-29 DIAGNOSIS — Z1231 Encounter for screening mammogram for malignant neoplasm of breast: Secondary | ICD-10-CM

## 2021-12-08 ENCOUNTER — Other Ambulatory Visit (HOSPITAL_COMMUNITY): Payer: Self-pay

## 2021-12-09 ENCOUNTER — Other Ambulatory Visit (HOSPITAL_BASED_OUTPATIENT_CLINIC_OR_DEPARTMENT_OTHER): Payer: Self-pay

## 2021-12-12 ENCOUNTER — Other Ambulatory Visit (HOSPITAL_COMMUNITY): Payer: Self-pay

## 2021-12-14 ENCOUNTER — Encounter (HOSPITAL_BASED_OUTPATIENT_CLINIC_OR_DEPARTMENT_OTHER): Payer: Self-pay

## 2021-12-14 ENCOUNTER — Ambulatory Visit (HOSPITAL_BASED_OUTPATIENT_CLINIC_OR_DEPARTMENT_OTHER)
Admission: RE | Admit: 2021-12-14 | Discharge: 2021-12-14 | Disposition: A | Discharge status: Home / Self Care | Payer: No Typology Code available for payment source | Source: Ambulatory Visit | Attending: Internal Medicine | Admitting: Internal Medicine

## 2021-12-14 DIAGNOSIS — Z1231 Encounter for screening mammogram for malignant neoplasm of breast: Secondary | ICD-10-CM | POA: Diagnosis present

## 2021-12-20 ENCOUNTER — Other Ambulatory Visit (HOSPITAL_COMMUNITY): Payer: Self-pay

## 2021-12-22 ENCOUNTER — Other Ambulatory Visit (HOSPITAL_COMMUNITY): Payer: Self-pay

## 2021-12-23 ENCOUNTER — Other Ambulatory Visit (HOSPITAL_COMMUNITY): Payer: Self-pay

## 2022-01-03 ENCOUNTER — Other Ambulatory Visit (HOSPITAL_BASED_OUTPATIENT_CLINIC_OR_DEPARTMENT_OTHER): Payer: Self-pay

## 2022-01-03 ENCOUNTER — Emergency Department (HOSPITAL_BASED_OUTPATIENT_CLINIC_OR_DEPARTMENT_OTHER): Payer: No Typology Code available for payment source

## 2022-01-03 ENCOUNTER — Encounter (HOSPITAL_BASED_OUTPATIENT_CLINIC_OR_DEPARTMENT_OTHER): Payer: Self-pay

## 2022-01-03 ENCOUNTER — Emergency Department (HOSPITAL_BASED_OUTPATIENT_CLINIC_OR_DEPARTMENT_OTHER)
Admission: EM | Admit: 2022-01-03 | Discharge: 2022-01-03 | Disposition: A | Discharge status: Home / Self Care | Payer: No Typology Code available for payment source | Attending: Emergency Medicine | Admitting: Emergency Medicine

## 2022-01-03 ENCOUNTER — Other Ambulatory Visit: Payer: Self-pay

## 2022-01-03 DIAGNOSIS — I10 Essential (primary) hypertension: Secondary | ICD-10-CM | POA: Diagnosis not present

## 2022-01-03 DIAGNOSIS — R490 Dysphonia: Secondary | ICD-10-CM | POA: Diagnosis present

## 2022-01-03 DIAGNOSIS — I6523 Occlusion and stenosis of bilateral carotid arteries: Secondary | ICD-10-CM | POA: Diagnosis not present

## 2022-01-03 DIAGNOSIS — E041 Nontoxic single thyroid nodule: Secondary | ICD-10-CM | POA: Diagnosis not present

## 2022-01-03 DIAGNOSIS — Z87891 Personal history of nicotine dependence: Secondary | ICD-10-CM | POA: Insufficient documentation

## 2022-01-03 DIAGNOSIS — Z79899 Other long term (current) drug therapy: Secondary | ICD-10-CM | POA: Diagnosis not present

## 2022-01-03 LAB — BASIC METABOLIC PANEL
Anion gap: 8 (ref 5–15)
BUN: 15 mg/dL (ref 6–20)
CO2: 25 mmol/L (ref 22–32)
Calcium: 9.3 mg/dL (ref 8.9–10.3)
Chloride: 108 mmol/L (ref 98–111)
Creatinine, Ser: 0.69 mg/dL (ref 0.44–1.00)
GFR, Estimated: >60 mL/min (ref >60)
Glucose, Bld: 120 mg/dL — ABNORMAL HIGH (ref 70–99)
Potassium: 3.7 mmol/L (ref 3.5–5.1)
Sodium: 141 mmol/L (ref 135–145)

## 2022-01-03 LAB — CBC WITH DIFFERENTIAL/PLATELET
Abs Immature Granulocytes: 0.04 10*3/uL (ref 0.00–0.07)
Basophils Absolute: 0.1 10*3/uL (ref 0.0–0.1)
Basophils Relative: 1 %
Eosinophils Absolute: 0.2 10*3/uL (ref 0.0–0.5)
Eosinophils Relative: 4 %
HCT: 42.6 % (ref 36.0–46.0)
Hemoglobin: 14.3 g/dL (ref 12.0–15.0)
Immature Granulocytes: 1 %
Lymphocytes Relative: 33 %
Lymphs Abs: 1.8 10*3/uL (ref 0.7–4.0)
MCH: 29.1 pg (ref 26.0–34.0)
MCHC: 33.6 g/dL (ref 30.0–36.0)
MCV: 86.8 fL (ref 80.0–100.0)
Monocytes Absolute: 0.5 10*3/uL (ref 0.1–1.0)
Monocytes Relative: 8 %
Neutro Abs: 3 10*3/uL (ref 1.7–7.7)
Neutrophils Relative %: 53 %
Platelets: 190 10*3/uL (ref 150–400)
RBC: 4.91 MIL/uL (ref 3.87–5.11)
RDW: 12.2 % (ref 11.5–15.5)
WBC: 5.6 10*3/uL (ref 4.0–10.5)
nRBC: 0 % (ref 0.0–0.2)

## 2022-01-03 LAB — GROUP A STREP BY PCR: Group A Strep by PCR: NOT DETECTED

## 2022-01-03 MED ORDER — SODIUM CHLORIDE 0.9 % IV BOLUS
1000.0000 mL | Freq: Once | INTRAVENOUS | Status: AC
  Administered 2022-01-03: 1000 mL via INTRAVENOUS

## 2022-01-03 MED ORDER — IOHEXOL 300 MG/ML  SOLN
100.0000 mL | Freq: Once | INTRAMUSCULAR | Status: AC | PRN
  Administered 2022-01-03: 75 mL via INTRAVENOUS

## 2022-01-03 MED ORDER — SUCRALFATE 1 G PO TABS
1.0000 g | ORAL_TABLET | Freq: Three times a day (TID) | ORAL | 0 refills | Status: DC
  Filled 2022-01-03: qty 21, 7d supply, fill #0

## 2022-01-03 NOTE — ED Provider Notes (Signed)
[facility] DEPART[facility] Provider Note   CSN: [id number] Arrival date & time: 01/03/22  5643     History  Chief Complaint  Patient presents with   Neck Swelling    [name] is a 56 y.o. female.  Patient as above with significant medical history as below, including gerd, anemia, htn, vertigo, celiac disease who presents to the ED with complaint of throat discomfort, hoarseness, globus sensation Pt with ongoing symptoms last 3 weeks, having some globus sensation, difficulty swallowing but is able to tolerate secretions and swallow appropriately but feels like sometimes food/drink items will get stuck in her throat Feels like she has been having hoarse voice over the last week Feels like her throat is mildly swollen externally around her thyroid gland on the right of midline No n/v, no numbness or tingling, no fevers/chills, no unexpected weight changes, no night sweats Appetite is good, no abd pain. No rashes Tried to get appt with pcp but not avail for 3 weeks Requesting ct scan, concerned about family hx of cancer, she is also former smoker  Pt is a [employer] scientist     Past Medical History:  Diagnosis Date   Anemia    iron def anemia, iron infusion   Bell's palsy    Celiac disease    Fibroid    GERD (gastroesophageal reflux disease)    History of pneumonia    Hypertension    Iron deficiency anemia    Menorrhagia    Psoriasis    STD (sexually transmitted disease)    HSV1   Vertigo     Past Surgical History:  Procedure Laterality Date   APPENDECTOMY      56 years old     The history is provided by the patient. No language interpreter was used.       Home Medications Prior to Admission medications   Medication Sig Start Date End Date Taking? Authorizing Provider  sucralfate (CARAFATE) 1 g tablet Take 1 tablet (1 g total) by mouth with breakfast, with lunch, and with evening meal for 7 days. 01/03/22 01/10/22 Yes Wynona Dove A, DO  albuterol  (VENTOLIN HFA) 108 (90 Base) MCG/ACT inhaler INHALE 2 PUFFS INTO THE LUNGS FOUR TIMES DAILY AS NEEDED. 10/17/21     ALPRAZolam (XANAX) 0.25 MG tablet  12/18/16   [provider]  ALPRAZolam (XANAX) 0.25 MG tablet Take 1 tablet (0.25 mg total) by mouth 4 (four) times daily. 11/03/21     Apremilast (OTEZLA) 30 MG TABS Take 1 tablet oral twice a day TAKE 1 TABLET BY MOUTH TWICE A DAY 11/11/21   Tresa Garter, MD  atorvastatin (LIPITOR) 40 MG tablet Take 1 tablet by mouth once daily 10/07/20     atorvastatin (LIPITOR) 40 MG tablet Take 1 tablet (40 mg total) by mouth daily. 11/07/21     Azelaic Acid 15 % gel Apply to face once daily 06/03/20     benzonatate (TESSALON) 200 MG capsule Take 1 capsule (200 mg total) by mouth 3 (three) times daily. 10/17/21     Calcium Carbonate-Vitamin D 600-200 MG-UNIT TABS Take 1 tablet by mouth as needed.    [provider]  Coenzyme Q10 (CO Q 10 PO) Take by mouth daily.    [provider]  COVID-19 At Home Antigen Test [facility] COVID-19 HOME TEST) KIT USE AS DIRECTED Patient not taking: Reported on 09/27/2021 10/19/20   Clementeen Graham, [place]  COVID-19 mRNA bivalent vaccine, Moderna, (MODERNA COVID-19 White) 50  MCG/0.5ML injection Inject into the muscle. Patient not taking: Reported on 09/27/2021 02/09/21   Snider, Cynthia, MD  famotidine (PEPCID) 40 MG tablet Take 1 tablet (40 mg total) by mouth daily. 11/03/21     furosemide (LASIX) 20 MG tablet Take 1 tablet by mouth once daily as needed 10/07/20     furosemide (LASIX) 20 MG tablet Take 1 tablet (20 mg total) by mouth daily. 11/03/21     guaiFENesin-codeine (GUAIFENESIN AC) 100-10 MG/5ML syrup Take 10 ML every 6 hours as needed 10/17/21     influenza vac split quadrivalent PF (FLUARIX) 0.5 ML injection Inject into the muscle. Patient not taking: Reported on 09/27/2021 11/30/20   Snider, Cynthia, MD  losartan (COZAAR) 50 MG tablet Take 1 tablet by mouth 2 times daily 10/20/21     losartan  (COZAAR) 50 MG tablet Take 1 tablet (50 mg total) by mouth 2 (two) times daily. 11/03/21     Multiple Vitamins-Minerals (MULTIVITAMIN PO) Take by mouth daily.    [provider]  nirmatrelvir & ritonavir (PAXLOVID, 300/100,) 20 x 150 MG & 10 x 100MG TBPK Take 3 tablets by mouth 2 times a day 10/17/21     pantoprazole (PROTONIX) 40 MG tablet Take 1 tablet by mouth once daily 10/07/20     Spacer/Aero-Holding Chambers (OPTICHAMBER DIAMOND) MISC Use with inhaler 10/17/21   Golding, John, MD      Allergies    Augmentin [amoxicillin-pot clavulanate], Erythromycin, and Bactrim [sulfamethoxazole-trimethoprim]    Review of Systems   Review of Systems  Constitutional:  Negative for activity change and fever.  HENT:  Positive for trouble swallowing and voice change. Negative for facial swelling.   Eyes:  Negative for discharge and redness.  Respiratory:  Negative for cough and shortness of breath.   Cardiovascular:  Negative for chest pain and palpitations.  Gastrointestinal:  Negative for abdominal pain and nausea.  Genitourinary:  Negative for dysuria and flank pain.  Musculoskeletal:  Negative for back pain and gait problem.  Skin:  Negative for pallor and rash.  Neurological:  Negative for syncope and headaches.    Physical Exam Updated Vital Signs BP 134/60 (BP Location: Right Arm)   Pulse 63   Temp 97.7 F (36.5 C) (Oral)   Resp 16   Ht 5' 4" (1.626 m)   LMP 05/31/2011   SpO2 100%   BMI 32.61 kg/m  Physical Exam Vitals and nursing note reviewed.  Constitutional:      General: She is not in acute distress.    Appearance: Normal appearance. She is not ill-appearing, toxic-appearing or diaphoretic.  HENT:     Head: Normocephalic and atraumatic.     Right Ear: External ear normal.     Left Ear: External ear normal.     Nose: Nose normal.     Mouth/Throat:     Mouth: Mucous membranes are moist. No oral lesions or angioedema.     Tongue: Tongue does not deviate from midline.      Pharynx: Oropharynx is clear. Uvula midline. No uvula swelling.  Eyes:     General: No scleral icterus.       Right eye: No discharge.        Left eye: No discharge.  Neck:     Vascular: No carotid bruit or JVD.     Trachea: Trachea normal. No tracheal deviation.     Comments: Pt reports hoarseness No drooling, stridor or trismus No angio edema No stridor  Cardiovascular:       Rate and Rhythm: Normal rate and regular rhythm.     Pulses: Normal pulses.     Heart sounds: Normal heart sounds.  Pulmonary:     Effort: Pulmonary effort is normal. No tachypnea or respiratory distress.     Breath sounds: Normal breath sounds. No stridor.  Abdominal:     General: Abdomen is flat.     Tenderness: There is no abdominal tenderness.  Musculoskeletal:        General: Normal range of motion.     Cervical back: Full passive range of motion without pain and normal range of motion. No edema or crepitus. No pain with movement.     Right lower leg: No edema.     Left lower leg: No edema.  Skin:    General: Skin is warm and dry.     Capillary Refill: Capillary refill takes less than 2 seconds.  Neurological:     Mental Status: She is alert and oriented to person, place, and time.     GCS: GCS eye subscore is 4. GCS verbal subscore is 5. GCS motor subscore is 6.  Psychiatric:        Mood and Affect: Mood normal.        Behavior: Behavior normal.     ED Results / Procedures / Treatments   Labs (all labs ordered are listed, but only abnormal results are displayed) Labs Reviewed  BASIC METABOLIC PANEL - Abnormal; Notable for the following components:      Result Value   Glucose, Bld 120 (*)    All other components within normal limits  GROUP A STREP BY PCR  CBC WITH DIFFERENTIAL/PLATELET  TSH  T4, FREE    EKG None  Radiology CT Soft Tissue Neck W Contrast  Result Date: 01/03/2022 CLINICAL DATA:  Hoarseness. Normal laryngeal exam. Globus sensation. Focal disturbance. Some  swelling of the right side of the neck. EXAM: CT NECK WITH CONTRAST TECHNIQUE: Multidetector CT imaging of the neck was performed using the standard protocol following the bolus administration of intravenous contrast. RADIATION DOSE REDUCTION: This exam was performed according to the departmental dose-optimization program which includes automated exposure control, adjustment of the mA and/or kV according to patient size and/or use of iterative reconstruction technique. CONTRAST:  75mL OMNIPAQUE IOHEXOL 300 MG/ML  SOLN COMPARISON:  None Available. FINDINGS: Pharynx and larynx: No evidence of mucosal or submucosal lesion. Normal appearance of the larynx and laryngeal cartilage. No imaging sign of recurrent laryngeal nerve palsy. Salivary glands: Parotid and submandibular glands are normal. Thyroid: Nodule of the thyroid isthmus measuring 7 x 12 mm. No followup recommended (ref: J Am Coll Radiol. 2015 Feb;12(2): 143-50). Lymph nodes: No lymphadenopathy on either side of the neck. Vascular: Atherosclerotic disease at both carotid bifurcations, notable for age. Particularly on the left, there is some stenosis in the ICA bulb. The study was not done in the form of a CT angiogram, but this narrowing is estimated at 50%. Has this been evaluated otherwise? Limited intracranial: Normal Visualized orbits: Normal Mastoids and visualized paranasal sinuses: Clear Skeleton: Ordinary cervical spondylosis and facet arthritis. Upper chest: Lung apices are clear. Superior mediastinum appears unremarkable. No visible abnormality of the cervical or upper thoracic esophagus. Other: None IMPRESSION: 1. No abnormality seen to explain the clinical presentation. No evidence of mucosal or submucosal lesion. No imaging sign of recurrent laryngeal nerve palsy. 2. Atherosclerotic disease at both carotid bifurcations, notable for age. Particularly on the left, there is some stenosis in the ICA bulb.   The study was not done in the form of a CT  angiogram, but this narrowing is estimated at 50%. Has this been evaluated otherwise? 3. Ordinary cervical spondylosis and facet arthritis. 4. 7 x 12 mm nodule of the thyroid isthmus. No follow-up recommended. Electronically Signed   By: Nelson Chimes M.D.   On: 01/03/2022 10:42   DG Chest Portable 1 View  Result Date: 01/03/2022 CLINICAL DATA:  Globus sensation.  Hoarseness. EXAM: PORTABLE CHEST 1 VIEW COMPARISON:  11/03/2019 FINDINGS: Heart size is normal. Aortic atherosclerotic calcification is present. Mediastinal shadows are otherwise normal. Lungs are clear. The vascularity is normal. No effusions. No abnormal bone finding. IMPRESSION: No active disease. Aortic atherosclerotic calcification. Electronically Signed   By: Nelson Chimes M.D.   On: 01/03/2022 09:47    Procedures Procedures    Medications Ordered in ED Medications  sodium chloride 0.9 % bolus 1,000 mL (0 mLs Intravenous Stopped 01/03/22 1047)  iohexol (OMNIPAQUE) 300 MG/ML solution 100 mL (75 mLs Intravenous Contrast Given 01/03/22 1012)    ED Course/ Medical Decision Making/ A&P                           Medical Decision Making Amount and/or Complexity of Data Reviewed Labs: ordered. Radiology: ordered.  Risk Prescription drug management.   This patient presents to the ED with chief complaint(s) of hoarse voice, swallowing problems, throat swelling with pertinent past medical history of as above which further complicates the presenting complaint. The complaint involves an extensive differential diagnosis and also carries with it a high risk of complications and morbidity.    The differential diagnosis includes but not limited to viral syndrome, malignancy, infectious, thyroid, endocrine, neuro disorder, achalasia, esophageal stricture, etc. Serious etiologies were considered.   The initial plan is to screening labs, ivf, will get ct soft tissue neck   Additional history obtained: Additional history obtained from   na Records reviewed Primary Care Documentshome meds, prior labs/imaging   Independent labs interpretation:  The following labs were independently interpreted:   Independent visualization of imaging: - I independently visualized the following imaging with scope of interpretation limited to determining acute life threatening conditions related to emergency care: cxr, ct soft tissue neck w/ iv con, which revealed some incidental findings but no large obstructing mass, atherosclerosis was noted and a thyroid nodule  Cardiac monitoring was reviewed and interpreted by myself which shows na  Treatment and Reassessment: Ivf >> stayed the same, she is able to tolerate po, no drooling/stridor/trismus, speaking easily   Consultation: - Consulted or discussed management/test interpretation w/ external professional: na  Consideration for admission or further workup: Admission was considered   Pt with hoarse voice and mild dysphagia sensation, workup today is reassuring, neuro non focal. Unclear etiology, feel she would benefit from a swallow study, will also start carafate given her hx chronic acid reflux which could be exacerbating this condition today. F/u with pcp for carotid u/s.   The patient improved significantly and was discharged in stable condition. Detailed discussions were had with the patient regarding current findings, and need for close f/u with PCP or on call doctor. The patient has been instructed to return immediately if the symptoms worsen in any way for re-evaluation. Patient verbalized understanding and is in agreement with current care plan. All questions answered prior to discharge.    Social Determinants of health: Social History   Tobacco Use   Smoking status: Former  Types: Cigarettes    Quit date: 08/07/1998    Years since quitting: 23.4   Smokeless tobacco: Never  Substance Use Topics   Alcohol use: Yes    Alcohol/week: 6.0 - 8.0 standard drinks of alcohol     Types: 6 - 8 Standard drinks or equivalent per week   Drug use: No            Final Clinical Impression(s) / ED Diagnoses Final diagnoses:  Hoarseness of voice  Atherosclerosis of both carotid arteries  Thyroid nodule    Rx / DC Orders ED Discharge Orders          Ordered    sucralfate (CARAFATE) 1 g tablet  3 times daily with meals        01/03/22 1143              ,  A, DO 01/03/22 1216  

## 2022-01-03 NOTE — ED Triage Notes (Signed)
C/o anterior neck swelling x 2 weeks with voice changes without pain. States feels something when she swallows. Hx of smoker, family hx of cancer.

## 2022-01-03 NOTE — Discharge Instructions (Addendum)
Please follow up with pcp in regards to carotid atherosclerosis, may require further outpatient evaluation of this - consider carotid ultrasound  Thyroid nodule is small and does not require any further workup  Please follow up with pcp to schedule a swallow study (barium likely most helpful)  Please follow up on mychart and with your pcp in regards to thyroid panel   It was a pleasure caring for you today in the emergency department.  Please return to the emergency department for any worsening or worrisome symptoms.

## 2022-01-04 LAB — T4, FREE: Free T4: 0.72 ng/dL (ref 0.61–1.12)

## 2022-01-04 LAB — TSH: TSH: 1.504 u[IU]/mL (ref 0.350–4.500)

## 2022-01-06 ENCOUNTER — Ambulatory Visit
Admission: EM | Admit: 2022-01-06 | Discharge: 2022-01-06 | Disposition: A | Discharge status: Home / Self Care | Payer: No Typology Code available for payment source | Attending: Emergency Medicine | Admitting: Emergency Medicine

## 2022-01-06 ENCOUNTER — Other Ambulatory Visit (HOSPITAL_BASED_OUTPATIENT_CLINIC_OR_DEPARTMENT_OTHER): Payer: Self-pay

## 2022-01-06 DIAGNOSIS — N898 Other specified noninflammatory disorders of vagina: Secondary | ICD-10-CM | POA: Insufficient documentation

## 2022-01-06 DIAGNOSIS — R300 Dysuria: Secondary | ICD-10-CM | POA: Insufficient documentation

## 2022-01-06 LAB — POCT URINALYSIS DIP (MANUAL ENTRY)
Bilirubin, UA: NEGATIVE
Blood, UA: NEGATIVE
Glucose, UA: NEGATIVE mg/dL
Ketones, POC UA: NEGATIVE mg/dL
Leukocytes, UA: NEGATIVE
Nitrite, UA: NEGATIVE
Protein Ur, POC: NEGATIVE mg/dL
Spec Grav, UA: 1.025 (ref 1.010–1.025)
Urobilinogen, UA: 0.2 E.U./dL
pH, UA: 5.5 (ref 5.0–8.0)

## 2022-01-06 MED ORDER — TERCONAZOLE 0.4 % VA CREA
TOPICAL_CREAM | VAGINAL | 0 refills | Status: DC
  Filled 2022-01-06: qty 45, 7d supply, fill #0

## 2022-01-06 NOTE — ED Triage Notes (Signed)
Pt reports burning when urinating and vaginal irritation x 1 day.

## 2022-01-06 NOTE — Discharge Instructions (Addendum)
The results of your vaginal swab test which screens for BV, yeast, gonorrhea, chlamydia and trichomonas will be made posted to your MyChart account once it is complete.  This typically takes 2 to 4 days.  Please abstain from sexual intercourse of any kind, vaginal, oral or anal, until you have received the results of your STD testing.     If any of your results are abnormal, you will receive a phone call regarding treatment.  Prescriptions, if any are needed, will be provided for you at your pharmacy.    The urinalysis that we performed in the clinic today was normal.  Because you are experiencing symptoms concerning for a urinary tract infection, urine culture will be performed per our protocol.    The result of the urine culture will be available in the next 3 to 5 days and will be posted to your MyChart account.  If there is an abnormal finding, you will be contacted by phone and advised of further treatment recommendations, if any.  For comfort, while you are waiting for the result of your vaginal swab test, I have provided you with an external cream that you can apply 3-4 times daily for relief of vaginal itching and burning.  I recommend that you abstain from sexual intercourse, tampon use or any other other intravaginal activities while you are waiting on these results and possible treatment.   Thank you for visiting urgent care today.  I appreciate the opportunity to participate in your care.

## 2022-01-07 LAB — URINE CULTURE: Culture: <10000 — AB

## 2022-01-07 NOTE — ED Provider Notes (Signed)
[facility]    CSN: [id number] Arrival date & time: 01/06/22  1016    HISTORY   Chief Complaint  Patient presents with   Dysuria        HPI [name] is a pleasant, 56 y.o. female who presents to urgent care today. Patient complains of a 1 day history of burning while urinating and vaginal irritation.  Patient endorses burning with urination and vaginal irritation.  Patient denies abnormal odor of urine, blood in urine, increased frequency of urination, increased urge to urinate, sensation of incomplete emptying, suprapubic pain, perineal pain, incontinence of urine, flank pain, fever, chills, malaise, rigors, significant fatigue, abnormal vaginal discharge, and possible exposure to STD.  Urine dip today is unremarkable.   The history is provided by the patient.   Past Medical History:  Diagnosis Date   Anemia    iron def anemia, iron infusion   Bell's palsy    Celiac disease    Fibroid    GERD (gastroesophageal reflux disease)    History of pneumonia    Hypertension    Iron deficiency anemia    Menorrhagia    Psoriasis    STD (sexually transmitted disease)    HSV1   Vertigo    Patient Active Problem List   Diagnosis Date Noted   Left elbow pain 11/07/2018   Left foot pain 11/28/2015   Psoriasis 09/07/2014   Celiac disease 09/04/2013    Class: History of   Iron deficiency anemia 09/23/2010   Past Surgical History:  Procedure Laterality Date   APPENDECTOMY      55 years old   OB History     Gravida  0   Para  0   Term  0   Preterm  0   AB  0   Living  0      SAB  0   IAB  0   Ectopic  0   Multiple  0   Live Births             Home Medications    Prior to Admission medications   Medication Sig Start Date End Date Taking? Authorizing Provider  terconazole (TERAZOL 7) 0.4 % vaginal cream Apply twice daily to vulvovaginal area, can reapply after every void, use for 7 days as needed 01/06/22  Yes Lynden Oxford Scales,  PA-C  albuterol (VENTOLIN HFA) 108 (90 Base) MCG/ACT inhaler INHALE 2 PUFFS INTO THE LUNGS FOUR TIMES DAILY AS NEEDED. 10/17/21     ALPRAZolam (XANAX) 0.25 MG tablet Take 1 tablet (0.25 mg total) by mouth 4 (four) times daily. 11/03/21     Apremilast (OTEZLA) 30 MG TABS Take 1 tablet oral twice a day TAKE 1 TABLET BY MOUTH TWICE A DAY 11/11/21   Tresa Garter, MD  atorvastatin (LIPITOR) 40 MG tablet Take 1 tablet by mouth once daily 10/07/20     atorvastatin (LIPITOR) 40 MG tablet Take 1 tablet (40 mg total) by mouth daily. 11/07/21     Azelaic Acid 15 % gel Apply to face once daily 06/03/20     benzonatate (TESSALON) 200 MG capsule Take 1 capsule (200 mg total) by mouth 3 (three) times daily. 10/17/21     Calcium Carbonate-Vitamin D 600-200 MG-UNIT TABS Take 1 tablet by mouth as needed.    [provider]  Coenzyme Q10 (CO Q 10 PO) Take by mouth daily.    [provider]  famotidine (PEPCID) 40 MG tablet Take 1 tablet (40  mg total) by mouth daily. 11/03/21     furosemide (LASIX) 20 MG tablet Take 1 tablet by mouth once daily as needed 10/07/20     furosemide (LASIX) 20 MG tablet Take 1 tablet (20 mg total) by mouth daily. 11/03/21     losartan (COZAAR) 50 MG tablet Take 1 tablet by mouth 2 times daily 10/20/21     losartan (COZAAR) 50 MG tablet Take 1 tablet (50 mg total) by mouth 2 (two) times daily. 11/03/21     Multiple Vitamins-Minerals (MULTIVITAMIN PO) Take by mouth daily.    [provider]  nirmatrelvir & ritonavir (PAXLOVID, 300/100,) 20 x 150 MG & 10 x 100MG TBPK Take 3 tablets by mouth 2 times a day 10/17/21     pantoprazole (PROTONIX) 40 MG tablet Take 1 tablet by mouth once daily 10/07/20     Spacer/Aero-Holding Chambers [facility] DIAMOND) MISC Use with inhaler 10/17/21   Sharilyn Sites, MD  sucralfate (CARAFATE) 1 g tablet Take 1 tablet (1 g total) by mouth with breakfast, with lunch, and with evening meal for 7 days. 01/03/22 01/10/22  Jeanell Sparrow, DO     Family History Family History  Problem Relation Age of Onset   Cancer Mother        lung   Cancer Brother        testicular   Heart attack Brother    Heart failure Father    Stroke Sister    Social History Social History   Tobacco Use   Smoking status: Former    Types: Cigarettes    Quit date: 08/07/1998    Years since quitting: 23.4   Smokeless tobacco: Never  Substance Use Topics   Alcohol use: Yes    Alcohol/week: 6.0 - 8.0 standard drinks of alcohol    Types: 6 - 8 Standard drinks or equivalent per week   Drug use: No   Allergies   Augmentin [amoxicillin-pot clavulanate], Erythromycin, and Bactrim [sulfamethoxazole-trimethoprim]  Review of Systems Review of Systems Pertinent findings revealed after performing a 14 point review of systems has been noted in the history of present illness.  Physical Exam Triage Vital Signs ED Triage Vitals  Enc Vitals Group     BP 12/03/20 0827 (!) 147/82     Pulse Rate 12/03/20 0827 72     Resp 12/03/20 0827 18     Temp 12/03/20 0827 98.3 F (36.8 C)     Temp Source 12/03/20 0827 Oral     SpO2 12/03/20 0827 98 %     Weight --      Height --      Head Circumference --      Peak Flow --      Pain Score 12/03/20 0826 5     Pain Loc --      Pain Edu? --      Excl. in [place]? --   No data found.  Updated Vital Signs BP (!) 161/85 (BP Location: Right Arm)   Pulse 65   Temp (!) 97.5 F (36.4 C) (Oral)   Resp 16   LMP 05/31/2011   SpO2 96%   Physical Exam Vitals and nursing note reviewed.  Constitutional:      General: She is not in acute distress.    Appearance: Normal appearance. She is not ill-appearing.  HENT:     Head: Normocephalic and atraumatic.  Eyes:     General: Lids are normal.        Right eye: No discharge.  Left eye: No discharge.     Extraocular Movements: Extraocular movements intact.     Conjunctiva/sclera: Conjunctivae normal.     Right eye: Right conjunctiva is not injected.     Left  eye: Left conjunctiva is not injected.  Neck:     Trachea: Trachea and phonation normal.  Cardiovascular:     Rate and Rhythm: Normal rate and regular rhythm.     Pulses: Normal pulses.     Heart sounds: Normal heart sounds. No murmur heard.    No friction rub. No gallop.  Pulmonary:     Effort: Pulmonary effort is normal. No accessory muscle usage, prolonged expiration or respiratory distress.     Breath sounds: Normal breath sounds. No stridor, decreased air movement or transmitted upper airway sounds. No decreased breath sounds, wheezing, rhonchi or rales.  Chest:     Chest wall: No tenderness.  Abdominal:     General: Abdomen is flat. Bowel sounds are normal. There is no distension.     Palpations: Abdomen is soft.     Tenderness: There is no abdominal tenderness. There is no right CVA tenderness or left CVA tenderness.     Hernia: No hernia is present.  Genitourinary:    Comments: Patient politely declines pelvic exam today, patient provided a vaginal swab for testing. Musculoskeletal:        General: Normal range of motion.     Cervical back: Normal range of motion and neck supple. Normal range of motion.  Lymphadenopathy:     Cervical: No cervical adenopathy.  Skin:    General: Skin is warm and dry.     Findings: No erythema or rash.  Neurological:     General: No focal deficit present.     Mental Status: She is alert and oriented to person, place, and time.  Psychiatric:        Mood and Affect: Mood normal.        Behavior: Behavior normal.     Visual Acuity Right Eye Distance:   Left Eye Distance:   Bilateral Distance:    Right Eye Near:   Left Eye Near:    Bilateral Near:     UC Couse / Diagnostics / Procedures:     Radiology No results found.  Procedures Procedures (including critical care time) EKG  Pending results:  Labs Reviewed  URINE CULTURE  POCT URINALYSIS DIP (MANUAL ENTRY)  CERVICOVAGINAL ANCILLARY ONLY    Medications Ordered in  UC: Medications - No data to display  UC Diagnoses / Final Clinical Impressions(s)   I have reviewed the triage vital signs and the nursing notes.  Pertinent labs & imaging results that were available during my care of the patient were reviewed by me and considered in my medical decision making (see chart for details).    Final diagnoses:  Vaginal irritation  Dysuria    STD screening was performed, patient advised that the results be posted to their MyChart and if any of the results are positive, they will be notified by phone, further treatment will be provided as indicated based on results of STD screening. Patient was advised to abstain from sexual intercourse until that they receive the results of their STD testing.  Patient was also advised to use condoms to protect themselves from STD exposure. Prescription for terconazole cream to be applied in the vulvovaginal area twice daily and after every trip to the bathroom was provided for patient's comfort while waiting for the results of her vaginal swab  test. Urinalysis today was normal. Return precautions advised.  Drug allergies reviewed, all questions addressed.     ED Prescriptions     Medication Sig Dispense Auth. Provider   terconazole (TERAZOL 7) 0.4 % vaginal cream Apply twice daily to vulvovaginal area, can reapply after every void, use for 7 days as needed 45 g Lynden Oxford Scales, PA-C      PDMP not reviewed this encounter.  Disposition Upon Discharge:  Condition: stable for discharge home  Patient presented with concern for an acute illness with associated systemic symptoms and significant discomfort requiring urgent management. In my opinion, this is a condition that a prudent lay person (someone who possesses an average knowledge of health and medicine) may potentially expect to result in complications if not addressed urgently such as respiratory distress, impairment of bodily function or dysfunction of bodily  organs.   As such, the patient has been evaluated and assessed, work-up was performed and treatment was provided in alignment with urgent care protocols and evidence based medicine.  Patient/parent/caregiver has been advised that the patient may require follow up for further testing and/or treatment if the symptoms continue in spite of treatment, as clinically indicated and appropriate.  Routine symptom specific, illness specific and/or disease specific instructions were discussed with the patient and/or caregiver at length.  Prevention strategies for avoiding STD exposure were also discussed.  The patient will follow up with their current PCP if and as advised. If the patient does not currently have a PCP we will assist them in obtaining one.   The patient may need specialty follow up if the symptoms continue, in spite of conservative treatment and management, for further workup, evaluation, consultation and treatment as clinically indicated and appropriate.  Patient/parent/caregiver verbalized understanding and agreement of plan as discussed.  All questions were addressed during visit.  Please see discharge instructions below for further details of plan.  Discharge Instructions:   Discharge Instructions      The results of your vaginal swab test which screens for BV, yeast, gonorrhea, chlamydia and trichomonas will be made posted to your MyChart account once it is complete.  This typically takes 2 to 4 days.  Please abstain from sexual intercourse of any kind, vaginal, oral or anal, until you have received the results of your STD testing.     If any of your results are abnormal, you will receive a phone call regarding treatment.  Prescriptions, if any are needed, will be provided for you at your pharmacy.    The urinalysis that we performed in the clinic today was normal.  Because you are experiencing symptoms concerning for a urinary tract infection, urine culture will be performed per our  protocol.    The result of the urine culture will be available in the next 3 to 5 days and will be posted to your MyChart account.  If there is an abnormal finding, you will be contacted by phone and advised of further treatment recommendations, if any.  For comfort, while you are waiting for the result of your vaginal swab test, I have provided you with an external cream that you can apply 3-4 times daily for relief of vaginal itching and burning.  I recommend that you abstain from sexual intercourse, tampon use or any other other intravaginal activities while you are waiting on these results and possible treatment.   Thank you for visiting urgent care today.  I appreciate the opportunity to participate in your care.     This  office note has been dictated using Museum/gallery curator.  Unfortunately, this method of dictation can sometimes lead to typographical or grammatical errors.  I apologize for your inconvenience in advance if this occurs.  Please do not hesitate to reach out to me if clarification is needed.       Lynden Oxford Scales, [place] 01/07/22 [phone]

## 2022-01-09 ENCOUNTER — Other Ambulatory Visit (HOSPITAL_COMMUNITY): Payer: Self-pay

## 2022-01-09 LAB — CERVICOVAGINAL ANCILLARY ONLY
Bacterial Vaginitis (gardnerella): NEGATIVE
Candida Glabrata: NEGATIVE
Candida Vaginitis: NEGATIVE
Chlamydia: NEGATIVE
Comment: NEGATIVE
Comment: NEGATIVE
Comment: NEGATIVE
Comment: NEGATIVE
Comment: NEGATIVE
Comment: NORMAL
Neisseria Gonorrhea: NEGATIVE
Trichomonas: NEGATIVE

## 2022-01-10 ENCOUNTER — Other Ambulatory Visit (HOSPITAL_COMMUNITY): Payer: Self-pay

## 2022-01-16 ENCOUNTER — Other Ambulatory Visit (HOSPITAL_COMMUNITY): Payer: Self-pay

## 2022-01-16 ENCOUNTER — Other Ambulatory Visit (HOSPITAL_BASED_OUTPATIENT_CLINIC_OR_DEPARTMENT_OTHER): Payer: Self-pay

## 2022-01-17 ENCOUNTER — Other Ambulatory Visit (HOSPITAL_COMMUNITY): Payer: Self-pay

## 2022-01-18 ENCOUNTER — Other Ambulatory Visit (HOSPITAL_COMMUNITY): Payer: Self-pay

## 2022-01-18 ENCOUNTER — Other Ambulatory Visit: Payer: Self-pay

## 2022-01-19 ENCOUNTER — Other Ambulatory Visit: Payer: Self-pay

## 2022-01-20 ENCOUNTER — Other Ambulatory Visit (HOSPITAL_COMMUNITY): Payer: Self-pay

## 2022-01-20 ENCOUNTER — Other Ambulatory Visit: Payer: Self-pay

## 2022-02-03 ENCOUNTER — Other Ambulatory Visit (HOSPITAL_COMMUNITY): Payer: Self-pay | Admitting: Internal Medicine

## 2022-02-03 DIAGNOSIS — I6529 Occlusion and stenosis of unspecified carotid artery: Secondary | ICD-10-CM

## 2022-02-08 ENCOUNTER — Ambulatory Visit (HOSPITAL_BASED_OUTPATIENT_CLINIC_OR_DEPARTMENT_OTHER)
Admission: RE | Admit: 2022-02-08 | Discharge: 2022-02-08 | Disposition: A | Discharge status: Home / Self Care | Payer: 59 | Source: Ambulatory Visit | Attending: Internal Medicine | Admitting: Internal Medicine

## 2022-02-08 DIAGNOSIS — I6529 Occlusion and stenosis of unspecified carotid artery: Secondary | ICD-10-CM | POA: Diagnosis not present

## 2022-02-08 DIAGNOSIS — I6523 Occlusion and stenosis of bilateral carotid arteries: Secondary | ICD-10-CM | POA: Diagnosis not present

## 2022-02-09 ENCOUNTER — Other Ambulatory Visit (HOSPITAL_COMMUNITY): Payer: Self-pay

## 2022-02-13 ENCOUNTER — Other Ambulatory Visit: Payer: Self-pay

## 2022-02-14 ENCOUNTER — Other Ambulatory Visit (HOSPITAL_COMMUNITY): Payer: Self-pay

## 2022-02-15 ENCOUNTER — Other Ambulatory Visit: Payer: Self-pay

## 2022-02-15 ENCOUNTER — Other Ambulatory Visit (HOSPITAL_COMMUNITY): Payer: Self-pay

## 2022-02-16 ENCOUNTER — Other Ambulatory Visit: Payer: Self-pay

## 2022-02-16 ENCOUNTER — Other Ambulatory Visit (HOSPITAL_COMMUNITY): Payer: Self-pay

## 2022-02-27 ENCOUNTER — Other Ambulatory Visit (HOSPITAL_COMMUNITY): Payer: Self-pay

## 2022-03-07 ENCOUNTER — Other Ambulatory Visit (HOSPITAL_COMMUNITY): Payer: Self-pay

## 2022-03-10 ENCOUNTER — Other Ambulatory Visit (HOSPITAL_COMMUNITY): Payer: Self-pay

## 2022-03-16 ENCOUNTER — Other Ambulatory Visit: Payer: Self-pay

## 2022-03-17 ENCOUNTER — Other Ambulatory Visit: Payer: Self-pay

## 2022-03-17 ENCOUNTER — Other Ambulatory Visit (HOSPITAL_COMMUNITY): Payer: Self-pay

## 2022-04-11 ENCOUNTER — Other Ambulatory Visit (HOSPITAL_COMMUNITY): Payer: Self-pay

## 2022-04-12 ENCOUNTER — Other Ambulatory Visit (HOSPITAL_COMMUNITY): Payer: Self-pay

## 2022-04-18 ENCOUNTER — Other Ambulatory Visit (HOSPITAL_BASED_OUTPATIENT_CLINIC_OR_DEPARTMENT_OTHER): Payer: Self-pay

## 2022-05-04 ENCOUNTER — Other Ambulatory Visit (HOSPITAL_COMMUNITY): Payer: Self-pay

## 2022-05-08 ENCOUNTER — Other Ambulatory Visit (HOSPITAL_COMMUNITY): Payer: Self-pay

## 2022-05-08 DIAGNOSIS — E7849 Other hyperlipidemia: Secondary | ICD-10-CM | POA: Diagnosis not present

## 2022-05-09 ENCOUNTER — Other Ambulatory Visit: Payer: Self-pay | Admitting: Pharmacist

## 2022-05-09 ENCOUNTER — Other Ambulatory Visit (HOSPITAL_COMMUNITY): Payer: Self-pay

## 2022-05-09 ENCOUNTER — Other Ambulatory Visit: Payer: Self-pay

## 2022-05-09 MED ORDER — OTEZLA 30 MG PO TABS: ORAL_TABLET | ORAL | 5 refills | Status: DC

## 2022-05-09 MED ORDER — OTEZLA 30 MG PO TABS
ORAL_TABLET | ORAL | 5 refills | Status: DC
  Filled 2022-05-09: qty 60, 30d supply, fill #0
  Filled 2022-05-25: qty 60, 30d supply, fill #1
  Filled 2022-06-27: qty 60, 30d supply, fill #2
  Filled 2022-08-02: qty 60, 30d supply, fill #3
  Filled 2022-08-29: qty 60, 30d supply, fill #4
  Filled 2022-09-29: qty 60, 30d supply, fill #5

## 2022-05-10 ENCOUNTER — Other Ambulatory Visit: Payer: Self-pay

## 2022-05-22 ENCOUNTER — Encounter: Payer: Self-pay | Admitting: *Deleted

## 2022-05-23 DIAGNOSIS — H9042 Sensorineural hearing loss, unilateral, left ear, with unrestricted hearing on the contralateral side: Secondary | ICD-10-CM | POA: Diagnosis not present

## 2022-05-23 DIAGNOSIS — R42 Dizziness and giddiness: Secondary | ICD-10-CM | POA: Diagnosis not present

## 2022-05-23 DIAGNOSIS — H9312 Tinnitus, left ear: Secondary | ICD-10-CM | POA: Diagnosis not present

## 2022-05-25 ENCOUNTER — Other Ambulatory Visit: Payer: Self-pay

## 2022-05-30 ENCOUNTER — Other Ambulatory Visit (HOSPITAL_COMMUNITY): Payer: Self-pay

## 2022-05-31 ENCOUNTER — Other Ambulatory Visit (HOSPITAL_COMMUNITY): Payer: Self-pay

## 2022-05-31 ENCOUNTER — Other Ambulatory Visit: Payer: Self-pay

## 2022-06-01 ENCOUNTER — Other Ambulatory Visit (HOSPITAL_COMMUNITY): Payer: Self-pay

## 2022-06-09 ENCOUNTER — Other Ambulatory Visit (HOSPITAL_BASED_OUTPATIENT_CLINIC_OR_DEPARTMENT_OTHER): Payer: Self-pay

## 2022-06-09 ENCOUNTER — Ambulatory Visit
Admission: EM | Admit: 2022-06-09 | Discharge: 2022-06-09 | Disposition: A | Discharge status: Home / Self Care | Payer: 59 | Attending: Family Medicine | Admitting: Family Medicine

## 2022-06-09 DIAGNOSIS — J0100 Acute maxillary sinusitis, unspecified: Secondary | ICD-10-CM

## 2022-06-09 MED ORDER — HYDROCOD POLI-CHLORPHE POLI ER 10-8 MG/5ML PO SUER
5.0000 mL | Freq: Two times a day (BID) | ORAL | 0 refills | Status: DC | PRN
  Filled 2022-06-09: qty 70, 7d supply, fill #0

## 2022-06-09 MED ORDER — DOXYCYCLINE HYCLATE 100 MG PO CAPS
100.0000 mg | ORAL_CAPSULE | Freq: Two times a day (BID) | ORAL | 0 refills | Status: DC
  Filled 2022-06-09: qty 20, 10d supply, fill #0

## 2022-06-09 NOTE — ED Provider Notes (Signed)
[facility]    CSN: [id number] Arrival date & time: 06/09/22  1014      History   Chief Complaint Chief Complaint  Patient presents with   Cough    HPI [name] is a 57 y.o. female.   She has had seasonal allergies the last several weeks.  Taking otc meds.  The last 4 days having thick yellow nasal drainage.  Productive cough, cough that keeps her awake at night.  No fever.  Today having facial pain, pressure.  Teeth hurting.  No sob, slight wheezing at night.  She quite smoking 24 years ago.        Past Medical History:  Diagnosis Date   Anemia    iron def anemia, iron infusion   Bell's palsy    Celiac disease    Fibroid    GERD (gastroesophageal reflux disease)    History of pneumonia    Hypertension    Iron deficiency anemia    Menorrhagia    Psoriasis    STD (sexually transmitted disease)    HSV1   Vertigo     Patient Active Problem List   Diagnosis Date Noted   Left elbow pain 11/07/2018   Left foot pain 11/28/2015   Psoriasis 09/07/2014   Celiac disease 09/04/2013    Class: History of   Iron deficiency anemia 09/23/2010    Past Surgical History:  Procedure Laterality Date   APPENDECTOMY      57 years old    OB History     Gravida  0   Para  0   Term  0   Preterm  0   AB  0   Living  0      SAB  0   IAB  0   Ectopic  0   Multiple  0   Live Births               Home Medications    Prior to Admission medications   Medication Sig Start Date End Date Taking? Authorizing Provider  albuterol (VENTOLIN HFA) 108 (90 Base) MCG/ACT inhaler INHALE 2 PUFFS INTO THE LUNGS FOUR TIMES DAILY AS NEEDED. 10/17/21     ALPRAZolam (XANAX) 0.25 MG tablet Take 1 tablet (0.25 mg total) by mouth 4 (four) times daily. 11/03/21     Apremilast (OTEZLA) 30 MG TABS TAKE 1 TABLET BY MOUTH TWICE A DAY 05/09/22   Quentin Angst, MD  atorvastatin (LIPITOR) 40 MG tablet Take 1 tablet by mouth once daily 10/07/20      atorvastatin (LIPITOR) 40 MG tablet Take 1 tablet (40 mg total) by mouth daily. 11/07/21     Azelaic Acid 15 % gel Apply to face once daily 06/03/20     Calcium Carbonate-Vitamin D 600-200 MG-UNIT TABS Take 1 tablet by mouth as needed.    [provider]  Coenzyme Q10 (CO Q 10 PO) Take by mouth daily.    [provider]  famotidine (PEPCID) 40 MG tablet Take 1 tablet (40 mg total) by mouth daily. 11/03/21     losartan (COZAAR) 50 MG tablet Take 1 tablet by mouth 2 times daily 10/20/21     losartan (COZAAR) 50 MG tablet Take 1 tablet (50 mg total) by mouth 2 (two) times daily. 11/03/21     Multiple Vitamins-Minerals (MULTIVITAMIN PO) Take by mouth daily.    [provider]  pantoprazole (PROTONIX) 40 MG tablet Take 1 tablet by mouth once daily 10/07/20  Spacer/Aero-Holding Chambers [facility] DIAMOND) MISC Use with inhaler 10/17/21   Assunta Found, MD  sucralfate (CARAFATE) 1 g tablet Take 1 tablet (1 g total) by mouth with breakfast, with lunch, and with evening meal for 7 days. 01/03/22 01/10/22  Sloan Leiter, DO  terconazole (TERAZOL 7) 0.4 % vaginal cream Apply twice daily to vulvovaginal area, can reapply after every void, use for 7 days as needed 01/06/22   Theadora Rama Scales, PA-C    Family History Family History  Problem Relation Age of Onset   Cancer Mother        lung   Cancer Brother        testicular   Heart attack Brother    Heart failure Father    Stroke Sister     Social History Social History   Tobacco Use   Smoking status: Former    Types: Cigarettes    Quit date: 08/07/1998    Years since quitting: 23.8   Smokeless tobacco: Never  Substance Use Topics   Alcohol use: Yes    Alcohol/week: 6.0 - 8.0 standard drinks of alcohol    Types: 6 - 8 Standard drinks or equivalent per week   Drug use: No     Allergies   Augmentin [amoxicillin-pot clavulanate], Erythromycin, and Bactrim [sulfamethoxazole-trimethoprim]   Review of  Systems Review of Systems  Constitutional: Negative.   HENT:  Positive for congestion, rhinorrhea and sinus pressure.   Respiratory:  Positive for cough. Negative for shortness of breath.   Cardiovascular: Negative.   Gastrointestinal: Negative.   Genitourinary: Negative.   Musculoskeletal: Negative.   Psychiatric/Behavioral: Negative.       Physical Exam Triage Vital Signs ED Triage Vitals  Enc Vitals Group     BP 06/09/22 1021 (!) 155/81     Pulse Rate 06/09/22 1021 73     Resp 06/09/22 1021 18     Temp 06/09/22 1021 98.5 F (36.9 C)     Temp Source 06/09/22 1021 Oral     SpO2 06/09/22 1021 96 %     Weight --      Height --      Head Circumference --      Peak Flow --      Pain Score 06/09/22 1022 2     Pain Loc --      Pain Edu? --      Excl. in [place]? --    No data found.  Updated Vital Signs BP (!) 155/81 (BP Location: Right Arm)   Pulse 73   Temp 98.5 F (36.9 C) (Oral)   Resp 18   LMP 05/31/2011   SpO2 96%   Visual Acuity Right Eye Distance:   Left Eye Distance:   Bilateral Distance:    Right Eye Near:   Left Eye Near:    Bilateral Near:     Physical Exam Constitutional:      General: She is not in acute distress.    Appearance: Normal appearance. She is normal weight. She is not ill-appearing.  HENT:     Head: Normocephalic.     Nose: Congestion present.     Right Sinus: Maxillary sinus tenderness present.     Left Sinus: Maxillary sinus tenderness present.     Mouth/Throat:     Mouth: Mucous membranes are moist.     Pharynx: No posterior oropharyngeal erythema.  Cardiovascular:     Rate and Rhythm: Normal rate and regular rhythm.  Pulmonary:     Effort: Pulmonary  effort is normal.     Breath sounds: Normal breath sounds. No wheezing.  Musculoskeletal:     Cervical back: Normal range of motion and neck supple. No tenderness.  Lymphadenopathy:     Cervical: No cervical adenopathy.  Skin:    General: Skin is warm.  Neurological:      General: No focal deficit present.     Mental Status: She is alert.  Psychiatric:        Mood and Affect: Mood normal.      UC Treatments / Results  Labs (all labs ordered are listed, but only abnormal results are displayed) Labs Reviewed - No data to display  EKG   Radiology No results found.  Procedures Procedures (including critical care time)  Medications Ordered in UC Medications - No data to display  Initial Impression / Assessment and Plan / UC Course  I have reviewed the triage vital signs and the nursing notes.  Pertinent labs & imaging results that were available during my care of the patient were reviewed by me and considered in my medical decision making (see chart for details).  Final Clinical Impressions(s) / UC Diagnoses   Final diagnoses:  Acute non-recurrent maxillary sinusitis     Discharge Instructions      You were seen today for acute sinusitis.  I have sent out doxycyline twice/day x 10 days and a cough syrup.  The cough syrup may make you tired/sleepy so please take when home and not driving.  You may continue you over the counter medications as well.  Please follow up if not improving.     ED Prescriptions     Medication Sig Dispense Auth. Provider   doxycycline (VIBRAMYCIN) 100 MG capsule Take 1 capsule (100 mg total) by mouth 2 (two) times daily. 20 capsule Bunnie Lederman, MD   chlorpheniramine-HYDROcodone (TUSSIONEX) 10-8 MG/5ML Take 5 mLs by mouth every 12 (twelve) hours as needed for cough. 70 mL Jannifer Franklin, MD      PDMP not reviewed this encounter.   Jannifer Franklin, MD 06/09/22 1050

## 2022-06-09 NOTE — ED Triage Notes (Signed)
Pt states has had allergies for past few weeks treating with OTC meds. States in the past 4 days having a productive cough in the mornings and a dry cough a night with congestion and facial pressure.

## 2022-06-09 NOTE — Discharge Instructions (Signed)
You were seen today for acute sinusitis.  I have sent out doxycyline twice/day x 10 days and a cough syrup.  The cough syrup may make you tired/sleepy so please take when home and not driving.  You may continue you over the counter medications as well.  Please follow up if not improving.

## 2022-06-13 ENCOUNTER — Other Ambulatory Visit (HOSPITAL_BASED_OUTPATIENT_CLINIC_OR_DEPARTMENT_OTHER): Payer: Self-pay

## 2022-06-13 ENCOUNTER — Telehealth: Payer: Self-pay | Admitting: Urgent Care

## 2022-06-13 MED ORDER — AMOXICILLIN 875 MG PO TABS
875.0000 mg | ORAL_TABLET | Freq: Two times a day (BID) | ORAL | 0 refills | Status: DC
  Filled 2022-06-13: qty 14, 7d supply, fill #0

## 2022-06-13 MED ORDER — PREDNISONE 20 MG PO TABS
40.0000 mg | ORAL_TABLET | Freq: Every day | ORAL | 0 refills | Status: DC
  Filled 2022-06-13: qty 10, 5d supply, fill #0

## 2022-06-13 NOTE — Telephone Encounter (Signed)
Patient has ongoing symptoms and is not responding to the doxycycline that she was prescribed from her most recent office visit over the weekend.  Has previously done better with amoxicillin as opposed to Augmentin.  As her symptoms have carried on for nearly 2 weeks, will also trial prednisone.  Maintain Zyrtec.

## 2022-06-27 ENCOUNTER — Other Ambulatory Visit (HOSPITAL_COMMUNITY): Payer: Self-pay

## 2022-06-28 DIAGNOSIS — R42 Dizziness and giddiness: Secondary | ICD-10-CM | POA: Diagnosis not present

## 2022-07-04 ENCOUNTER — Other Ambulatory Visit (HOSPITAL_COMMUNITY): Payer: Self-pay

## 2022-07-12 DIAGNOSIS — H832X1 Labyrinthine dysfunction, right ear: Secondary | ICD-10-CM | POA: Diagnosis not present

## 2022-07-12 DIAGNOSIS — R42 Dizziness and giddiness: Secondary | ICD-10-CM | POA: Diagnosis not present

## 2022-07-17 ENCOUNTER — Other Ambulatory Visit (HOSPITAL_BASED_OUTPATIENT_CLINIC_OR_DEPARTMENT_OTHER): Payer: Self-pay

## 2022-07-26 ENCOUNTER — Ambulatory Visit: Payer: 59 | Attending: Internal Medicine | Admitting: Pharmacist

## 2022-07-26 ENCOUNTER — Telehealth: Payer: Self-pay | Admitting: Pharmacist

## 2022-07-26 DIAGNOSIS — Z79899 Other long term (current) drug therapy: Secondary | ICD-10-CM

## 2022-07-26 NOTE — Progress Notes (Signed)
   S: Patient presents today to the for review of her medication.   Patient is currently taking Otezla for psoriasis. Patient is managed by [facility] for this.   Adherence: denies any missed doses.   Dose: 30 mg PO BID  Efficacy: reports that it is working well for her.   Monitoring: - GI Upset: denies  - Headache: denies - Weight loss: denies - S/sx of infection: denies - Mood changes: denies  O:  Lab Results  Component Value Date   WBC 5.6 01/03/2022   HGB 14.3 01/03/2022   HCT 42.6 01/03/2022   MCV 86.8 01/03/2022   PLT 190 01/03/2022      Chemistry      Component Value Date/Time   NA 141 01/03/2022 0923   K 3.7 01/03/2022 0923   CL 108 01/03/2022 0923   CO2 25 01/03/2022 0923   BUN 15 01/03/2022 0923   CREATININE 0.69 01/03/2022 0923      Component Value Date/Time   CALCIUM 9.3 01/03/2022 0923   ALKPHOS 86 03/01/2013 1650   AST 24 03/01/2013 1650   ALT 24 03/01/2013 1650   BILITOT 0.8 03/01/2013 1650       A/P: 1. Medication review: patient currently on Otezla for psoriasis and is tolerating it well with no adverse effects and improved control of her psoriasis. Reviewed the medication with her, including how the medication works, possible adverse effects, and the need for regular follow up with her dermatologist. Patient aware of possible adverse effects such as GI upset, mood changes (depression), and weight loss. No recommendations for any changes.   Butch Penny, PharmD, [place], CPP Clinical Pharmacist [facility] [phone]

## 2022-07-26 NOTE — Telephone Encounter (Signed)
Called patient to schedule an appointment for the [employer] [facility]. I was unable to reach the patient so I left a HIPAA-compliant message requesting that the patient return my call.   Luke Van Ausdall, PharmD, BCACP, CPP Clinical Pharmacist [facility] [phone]  

## 2022-07-27 ENCOUNTER — Encounter: Payer: Self-pay | Admitting: Physical Therapy

## 2022-07-27 ENCOUNTER — Ambulatory Visit: Payer: 59 | Attending: Otolaryngology | Admitting: Physical Therapy

## 2022-07-27 VITALS — BP 155/72 | HR 76

## 2022-07-27 DIAGNOSIS — R2681 Unsteadiness on feet: Secondary | ICD-10-CM | POA: Diagnosis not present

## 2022-07-27 DIAGNOSIS — R42 Dizziness and giddiness: Secondary | ICD-10-CM | POA: Diagnosis not present

## 2022-07-27 NOTE — Therapy (Signed)
OUTPATIENT PHYSICAL THERAPY VESTIBULAR EVALUATION     Patient Name: [name] MRN: [id number] DOB:05-22-1965, 57 y.o., female Today's Date: 07/28/2022  END OF SESSION:  PT End of Session - 07/27/22 1320     Visit Number 1    Number of Visits 7   including eval   Date for PT Re-Evaluation 09/22/22    Authorization Type [employer]et[employer]    PT Start Time 1320    PT Stop Time 1400    PT Time Calculation (min) 40 min    Equipment Utilized During Treatment Gait belt    Activity Tolerance Patient tolerated treatment well    Behavior During Therapy WFL for tasks assessed/performed             Past Medical History:  Diagnosis Date   Anemia    iron def anemia, iron infusion   Bell's palsy    Celiac disease    Fibroid    GERD (gastroesophageal reflux disease)    History of pneumonia    Hypertension    Iron deficiency anemia    Menorrhagia    Psoriasis    STD (sexually transmitted disease)    HSV1   Vertigo    Past Surgical History:  Procedure Laterality Date   APPENDECTOMY      57 years old   Patient Active Problem List   Diagnosis Date Noted   Left elbow pain 11/07/2018   Left foot pain 11/28/2015   Psoriasis 09/07/2014   Celiac disease 09/04/2013    Class: History of   Iron deficiency anemia 09/23/2010    PCP: Elfredia Nevins, MD REFERRING PROVIDER: Karle Barr, MD  REFERRING DIAG: R42 (ICD-10-CM) - Dizziness   THERAPY DIAG:  Dizziness and giddiness  Unsteadiness on feet  ONSET DATE: 07/18/2022 (referral date)  Rationale for Evaluation and Treatment: Rehabilitation  SUBJECTIVE:   SUBJECTIVE STATEMENT: Patient already seen by ENT. Per patient report and chart review, patient has symptoms consistent with possible L side Meniere's disease but not conclusive. Patient also presents with symptoms consistent with R side peripheral vestibular hypofunction per studies as well. Patient reports that starting in her 53s she would get long dizzy/vertigo spells that  lasted for multiple days. Patient reports that she can't look up or turn her head quickly in her yoga class without get room spinning as well. Patient had been doing much better but then had a vomiting episode with vertigo in March. Patient took meclizine/zofran at this time. This most recent episode last 10 days.   Pt accompanied by: self  PERTINENT HISTORY: suspected Meniere's disease L side, symptoms consistent with R vestibular hypofunction, psoriasis  PAIN:  Are you having pain? No  PRECAUTIONS: Fall  WEIGHT BEARING RESTRICTIONS: No  FALLS: Has patient fallen in last 6 months? No  LIVING ENVIRONMENT: Lives with: lives alone Lives in: House/apartment Stairs: Yes: Internal: 15 steps; on left going up and External: 6 steps; bilateral but cannot reach both Has following equipment at home: Grab bars  PLOF: Independent - Working at the [employer]   PATIENT GOALS: "Management and improvement of symptoms."  OBJECTIVE:   Vitals:   07/27/22 1326  BP: (!) 155/72  Pulse: 76    DIAGNOSTIC FINDINGS: No recent relevant imaging  COGNITION: Overall cognitive status: Within functional limits for tasks assessed   SENSATION: WFL  EDEMA:  Abscent  Cervical ROM:   WFL - Painfree  PATIENT SURVEYS:  FOTO 48  VESTIBULAR ASSESSMENT:  GENERAL OBSERVATION: Ambulates without AD  SYMPTOM BEHAVIOR:  Subjective history: see above Non-Vestibular symptoms: changes in hearing, neck pain, headaches, tinnitus, nausea/vomiting, and migraine symptoms Type of dizziness: Imbalance (Disequilibrium), Spinning/Vertigo, Unsteady with head/body turns, and "World moves" Frequency: large episodes every few years, minor symptoms any given day  Duration: large episodes = days, seconds for smaller episodes  Aggravating factors:  moving head quickly, looking up  Relieving factors:  being still  Progression of symptoms: worse  OCULOMOTOR EXAM:  Ocular Alignment: normal  Ocular ROM: No  Limitations  Spontaneous Nystagmus: absent  Gaze-Induced Nystagmus: absent  Smooth Pursuits: intact  Saccades: intact  Convergence/Divergence: < 5 cm   VBI: negative bilaterally  Test of Skew: normal bilaterally  VESTIBULAR - OCULAR REFLEX:   Slow VOR: Normal  VOR Cancellation: Normal  Head-Impulse Test: HIT Right: negative HIT Left: negative  Dynamic Visual Acuity: To be assessed   POSITIONAL TESTING: To be assessed  MOTION SENSITIVITY:  Motion Sensitivity Quotient Intensity: 0 = none, 1 = Lightheaded, 2 = Mild, 3 = Moderate, 4 = Severe, 5 = Vomiting  Intensity  1. Sitting to supine   2. Supine to L side   3. Supine to R side   4. Supine to sitting   5. L Hallpike-Dix   6. Up from L    7. R Hallpike-Dix   8. Up from R    9. Sitting, head tipped to L knee 0  10. Head up from L knee 0  11. Sitting, head tipped to R knee 0  12. Head up from R knee 0  13. Sitting head turns x5 2  14.Sitting head nods x5 2  15. In stance, 180 turn to L  0  16. In stance, 180 turn to R 0    VESTIBULAR TREATMENT:                                                                                                    TherAct: Educated patient on difference between hypofunction and Meniere's disease. Discussed diet modifications and provided printed handout of more information about meniere's disease from the vestibular disorders association. Reviewed interpretation of today's findings with those seen with Dr. Suszanne Conners.   PATIENT EDUCATION: Education details: POC, examination findings, results, and goal collaboration, information as noted above Person educated: Patient Education method: Chief Technology Officer Education comprehension: verbalized understanding and needs further education  HOME EXERCISE PROGRAM:  To be provided  GOALS: Goals reviewed with patient? Yes  SHORT TERM GOALS: Target date: 08/25/2022  Patient will demonstrate 100% compliance with initial HEP to continue to progress  between physical therapy sessions.   Baseline: To be provided Goal status: INITIAL  2.  Therapist will assess SOT and write long term goal as indicated. Baseline: To be assessed Goal status: INITIAL   LONG TERM GOALS: Target date: 09/08/2022  Patient will report demonstrate independence with final HEP in order to maintain current gains and continue to progress after physical therapy discharge.   Baseline: To be provided Goal status: INITIAL  2.  Patient will improve MSQ score < 2/5 points with vertical/horizontal head turns  to indicate reduction in motion sensitivity in order to progress towards baseline function.   Baseline: 2/5 for both currently Goal status: INITIAL  3.  Patient will improve FOTO score to 60 to achieve predicted improvements in functional mobility due to skilled physical therapy interventions to increase safety with and participation in daily activities.  Baseline: 48 Goal status: INITIAL  4.  SOT to be assessed/goal written as indicated Baseline: To be assessed Goal status: INITIAL  ASSESSMENT:  CLINICAL IMPRESSION: Patient is a 57 y.o. female who was seen today for physical therapy evaluation and treatment for suspected L Meniere's disease with findings by ENT consistent with secondary underlying R vestibular hypofunction. While patient findings today with HIT test did not show hypofunction, patient likely compensating well at this time. Mild levels of motion sensitivity reported with horizontal/vertical head turns so will trial adaptation/habituation exercises to minimize response. Patient was provided an education handout of Meniere's in today's session and educated on the different approach that would be taken in regards to Meniere's with more management versus hypofunction with emphasis on adaptation/habituation. Patient verbalized understanding. Patient will benefit from skilled physical therapy services in order to maximize function.    OBJECTIVE IMPAIRMENTS:  decreased balance and dizziness.   ACTIVITY LIMITATIONS: bending, transfers, reach over head, and quickly moving head  PARTICIPATION LIMITATIONS: cleaning, community activity, and occupation  PERSONAL FACTORS: Time since onset of injury/illness/exacerbation and 3+ comorbidities: see above  are also affecting patient's functional outcome.   REHAB POTENTIAL: Fair Meniere's has mixed response to PT and may be exacerbated by accute flair, anticipate hypofunction may respond well  CLINICAL DECISION MAKING: Evolving/moderate complexity  EVALUATION COMPLEXITY: Moderate   PLAN:  PT FREQUENCY: 1x/week  PT DURATION: 6 weeks  PLANNED INTERVENTIONS: Therapeutic exercises, Therapeutic activity, Neuromuscular re-education, Balance training, Gait training, Patient/Family education, Self Care, Vestibular training, Canalith repositioning, Manual therapy, and Re-evaluation  PLAN FOR NEXT SESSION: assess positional testing, assess SOT and write goal as indicated, initial HEP, additional education on Meniere's management, adaptation/habituation exercises   Carmelia Bake, PT, DPT 07/28/2022, 4:45 PM

## 2022-07-28 ENCOUNTER — Encounter: Payer: Self-pay | Admitting: Physical Therapy

## 2022-08-02 ENCOUNTER — Other Ambulatory Visit (HOSPITAL_COMMUNITY): Payer: Self-pay

## 2022-08-04 ENCOUNTER — Other Ambulatory Visit (HOSPITAL_COMMUNITY): Payer: Self-pay

## 2022-08-14 ENCOUNTER — Ambulatory Visit: Payer: 59 | Attending: Otolaryngology | Admitting: Physical Therapy

## 2022-08-14 ENCOUNTER — Encounter: Payer: Self-pay | Admitting: Physical Therapy

## 2022-08-14 DIAGNOSIS — R42 Dizziness and giddiness: Secondary | ICD-10-CM | POA: Insufficient documentation

## 2022-08-14 DIAGNOSIS — R2681 Unsteadiness on feet: Secondary | ICD-10-CM | POA: Diagnosis not present

## 2022-08-14 NOTE — Therapy (Signed)
OUTPATIENT PHYSICAL THERAPY VESTIBULAR TREATMENT     Patient Name: [name] MRN: [id number] DOB:[birth year], 57 y.o., female Today's Date: 08/14/2022  END OF SESSION:  PT End of Session - 08/14/22 1234     Visit Number 2    Number of Visits 7   including eval   Date for PT Re-Evaluation 09/22/22    Authorization Type [employer]et[employer]    PT Start Time 1233    PT Stop Time 1312    PT Time Calculation (min) 39 min    Equipment Utilized During Treatment --    Activity Tolerance Patient tolerated treatment well    Behavior During Therapy WFL for tasks assessed/performed              Past Medical History:  Diagnosis Date   Anemia    iron def anemia, iron infusion   Bell's palsy    Celiac disease    Fibroid    GERD (gastroesophageal reflux disease)    History of pneumonia    Hypertension    Iron deficiency anemia    Menorrhagia    Psoriasis    STD (sexually transmitted disease)    HSV1   Vertigo    Past Surgical History:  Procedure Laterality Date   APPENDECTOMY      57 years old   Patient Active Problem List   Diagnosis Date Noted   Left elbow pain 11/07/2018   Left foot pain 11/28/2015   Psoriasis 09/07/2014   Celiac disease 09/04/2013    Class: History of   Iron deficiency anemia 09/23/2010    PCP: Elfredia Nevins, MD REFERRING PROVIDER: Karle Barr, MD  REFERRING DIAG: R42 (ICD-10-CM) - Dizziness   THERAPY DIAG:  Dizziness and giddiness  Unsteadiness on feet  ONSET DATE: 07/18/2022 (referral date)  Rationale for Evaluation and Treatment: Rehabilitation  SUBJECTIVE:   SUBJECTIVE STATEMENT: Nothing new since she was last here. Had a couple episodes where just feeling off. Has a baseline that she has been living with.   Pt accompanied by: self  PERTINENT HISTORY: suspected Meniere's disease L side, symptoms consistent with R vestibular hypofunction, psoriasis  PAIN:  Are you having pain? No  PRECAUTIONS: Fall  WEIGHT BEARING RESTRICTIONS:  No  FALLS: Has patient fallen in last 6 months? No  LIVING ENVIRONMENT: Lives with: lives alone Lives in: House/apartment Stairs: Yes: Internal: 15 steps; on left going up and External: 6 steps; bilateral but cannot reach both Has following equipment at home: Grab bars  PLOF: Independent - Working at the [employer]   PATIENT GOALS: "Management and improvement of symptoms."  OBJECTIVE:   There were no vitals filed for this visit.   DIAGNOSTIC FINDINGS: No recent relevant imaging  COGNITION: Overall cognitive status: Within functional limits for tasks assessed   SENSATION: WFL  EDEMA:  Abscent  Cervical ROM:   WFL - Painfree  PATIENT SURVEYS:  FOTO 48   VESTIBULAR TREATMENT:  DVA: Static: Line 10 Dynamic: Line 10 Feeling just a little off  SOT:  Conditions: 1: 3 trials WNL 2: 2 trials WNL, 1 below  3:  3 trials WNL 4: 3 trials WNL 5: 3 trials WNL 6: 1 fall, 2 trials WNL  Composite score: 80  Sensory Analysis Som: WNL Vis: WNL Vest: WNL Pref: WNL Strategy analysis: Equal hip/ankle strategy COG alignment: Pt with COG posteriorly and slightly to the R    Access Code: [facility] URL: [email or web address] Date: 08/14/2022 Prepared by: Sherlie Ban  Initiated HEP, see MedBridge for more details:   Exercises - Romberg Stance Eyes Closed on Foam Pad  - 1-2 x daily - 5 x weekly - 3 sets - 30 hold - Wide Stance with Eyes Closed on Foam Pad  - 1-2 x daily - 5 x weekly - 2 sets - 5 reps - head turns and head nods - pt with incr postural sway with head nods  - Standing on Foam Pad  - 1 x daily - 5 x weekly - 2 sets - 5 reps head turns and head nods with EO, pt with mild symptoms with head turns, mild/mod with head nods   PATIENT EDUCATION: Education details: Results of DVA, SOT, initial HEP  Person educated: Patient Education  method: Explanation and Handouts Education comprehension: verbalized understanding and needs further education  HOME EXERCISE PROGRAM: Access Code: [facility] URL: [email or web address] Date: 08/14/2022 Prepared by: Sherlie Ban  Exercises - Romberg Stance Eyes Closed on Foam Pad  - 1-2 x daily - 5 x weekly - 3 sets - 30 hold - Wide Stance with Eyes Closed on Foam Pad  - 1-2 x daily - 5 x weekly - 2 sets - 5 reps - head turns and head nods  - Standing on Foam Pad  - 1 x daily - 5 x weekly - 2 sets - 5 reps head turns and head nods   GOALS: Goals reviewed with patient? Yes  SHORT TERM GOALS: Target date: 08/25/2022  Patient will demonstrate 100% compliance with initial HEP to continue to progress between physical therapy sessions.   Baseline: To be provided Goal status: INITIAL  2.  Therapist will assess SOT and write long term goal as indicated. Baseline: Goal not needed - WNL  Goal status: MET   LONG TERM GOALS: Target date: 09/08/2022  Patient will report demonstrate independence with final HEP in order to maintain current gains and continue to progress after physical therapy discharge.   Baseline: To be provided Goal status: INITIAL  2.  Patient will improve MSQ score < 2/5 points with vertical/horizontal head turns to indicate reduction in motion sensitivity in order to progress towards baseline function.   Baseline: 2/5 for both currently Goal status: INITIAL  3.  Patient will improve FOTO score to 60 to achieve predicted improvements in functional mobility due to skilled physical therapy interventions to increase safety with and participation in daily activities.  Baseline: 48 Goal status: INITIAL  4.  SOT to be assessed/goal written as indicated Baseline: Goal not needed - WNL  Goal status: MET  ASSESSMENT:  CLINICAL IMPRESSION: Assessed DVA today with pt able to read line 10 with both static and dynamic, which is within normal limits for VOR. Also  assessed SOT, with pt scoring above normal for composite score and all sensory analysis. LTG not needed. Remainder of session focused on initiating HEP for habituation tasks for head motions with EO with pt reporting mild/mod symptoms with head turns and  nods (more so with nods). Also worked on balance with EC as pt had more postural sway with this. Pt tolerated well, will continue to progress towards LTGs.   OBJECTIVE IMPAIRMENTS: decreased balance and dizziness.   ACTIVITY LIMITATIONS: bending, transfers, reach over head, and quickly moving head  PARTICIPATION LIMITATIONS: cleaning, community activity, and occupation  PERSONAL FACTORS: Time since onset of injury/illness/exacerbation and 3+ comorbidities: see above  are also affecting patient's functional outcome.   REHAB POTENTIAL: Fair Meniere's has mixed response to PT and may be exacerbated by accute flair, anticipate hypofunction may respond well  CLINICAL DECISION MAKING: Evolving/moderate complexity  EVALUATION COMPLEXITY: Moderate   PLAN:  PT FREQUENCY: 1x/week  PT DURATION: 6 weeks  PLANNED INTERVENTIONS: Therapeutic exercises, Therapeutic activity, Neuromuscular re-education, Balance training, Gait training, Patient/Family education, Self Care, Vestibular training, Canalith repositioning, Manual therapy, and Re-evaluation  PLAN FOR NEXT SESSION: assess positional testing, add to initial HEP, additional education on Meniere's management, adaptation/habituation exercises   Drake Leach, PT, DPT 08/14/2022, 1:15 PM

## 2022-08-24 ENCOUNTER — Ambulatory Visit: Payer: 59 | Admitting: Physical Therapy

## 2022-08-24 ENCOUNTER — Encounter: Payer: Self-pay | Admitting: Physical Therapy

## 2022-08-24 DIAGNOSIS — R2681 Unsteadiness on feet: Secondary | ICD-10-CM

## 2022-08-24 DIAGNOSIS — R42 Dizziness and giddiness: Secondary | ICD-10-CM

## 2022-08-24 NOTE — Therapy (Signed)
OUTPATIENT PHYSICAL THERAPY VESTIBULAR TREATMENT     Patient Name: [name] MRN: [id number] DOB:07-Jun-1965, 57 y.o., female Today's Date: 08/24/2022  END OF SESSION:  PT End of Session - 08/24/22 1105     Visit Number 3    Number of Visits 7   including eval   Date for PT Re-Evaluation 09/22/22    Authorization Type Aetna    PT Start Time 1104    PT Stop Time 1145    PT Time Calculation (min) 41 min    Equipment Utilized During Treatment Gait belt    Activity Tolerance Patient tolerated treatment well    Behavior During Therapy WFL for tasks assessed/performed              Past Medical History:  Diagnosis Date   Anemia    iron def anemia, iron infusion   Bell's palsy    Celiac disease    Fibroid    GERD (gastroesophageal reflux disease)    History of pneumonia    Hypertension    Iron deficiency anemia    Menorrhagia    Psoriasis    STD (sexually transmitted disease)    HSV1   Vertigo    Past Surgical History:  Procedure Laterality Date   APPENDECTOMY      57 years old   Patient Active Problem List   Diagnosis Date Noted   Left elbow pain 11/07/2018   Left foot pain 11/28/2015   Psoriasis 09/07/2014   Celiac disease 09/04/2013    Class: History of   Iron deficiency anemia 09/23/2010    PCP: Elfredia Nevins, MD REFERRING PROVIDER: Karle Barr, MD  REFERRING DIAG: R42 (ICD-10-CM) - Dizziness   THERAPY DIAG:  Dizziness and giddiness  Unsteadiness on feet  ONSET DATE: 07/18/2022 (referral date)  Rationale for Evaluation and Treatment: Rehabilitation  SUBJECTIVE:   SUBJECTIVE STATEMENT: L ear started feeling more full on Tuesday. Felt like head motions were getting a little better.Notices dizziness is more off with EC.   Pt accompanied by: self  PERTINENT HISTORY: suspected Meniere's disease L side, symptoms consistent with R vestibular hypofunction, psoriasis  PAIN:  Are you having pain? No  PRECAUTIONS: Fall  WEIGHT BEARING  RESTRICTIONS: No  FALLS: Has patient fallen in last 6 months? No  LIVING ENVIRONMENT: Lives with: lives alone Lives in: House/apartment Stairs: Yes: Internal: 15 steps; on left going up and External: 6 steps; bilateral but cannot reach both Has following equipment at home: Grab bars  PLOF: Independent - Working at the [employer]   PATIENT GOALS: "Management and improvement of symptoms."  OBJECTIVE:   There were no vitals filed for this visit.   DIAGNOSTIC FINDINGS: No recent relevant imaging  COGNITION: Overall cognitive status: Within functional limits for tasks assessed   SENSATION: WFL  EDEMA:  Abscent  Cervical ROM:   WFL - Painfree  PATIENT SURVEYS:  FOTO 48   VESTIBULAR TREATMENT:  MOTION SENSITIVITY:            Motion Sensitivity Quotient Intensity: 0 = none, 1 = Lightheaded, 2 = Mild, 3 = Moderate, 4 = Severe, 5 = Vomiting  Bolded performed on 08/24/22   Intensity  1. Sitting to supine  0  2. Supine to L side  0  3. Supine to R side  0  4. Supine to sitting  0  5. L Hallpike-Dix  0  6. Up from L   0  7. R Hallpike-Dix  0  8. Up from R   0  9. Sitting, head tipped to L knee 0  10. Head up from L knee 0  11. Sitting, head tipped to R knee 0  12. Head up from R knee 0  13. Sitting head turns x5 2  14.Sitting head nods x5 2  15. In stance, 180 turn to L  0  16. In stance, 180 turn to R 0    Performed sidelying R and L with no nystagmus/dizziness.   NMR: On rockerboard in A/P direction: EO x10 reps head turns, x10 reps head nods EC: static stance x30 seconds, rocking board 2 x 30 seconds EC: 5 reps and 10 reps head turns, 5 and 10 reps nods, pt with incr postural sway. Needing intermittent UE support for balance  Gait with head turns 2 x 20', head nods 2 x 20', holding ball and making diagonals 2 x 20', no dizziness or  unsteadiness Gait with EC 2 x 20', no unsteadiness, pt walking in 7 seconds On air ex: marching EC  2 x 10 reps, marching EO with 10 head turns, marching EC with 2 x 10 reps head turns  PATIENT EDUCATION: Education details: Meniere's education and provided handout, pt negative for positional testing, continue HEP with tasks with EC  Person educated: Patient Education method: Explanation, Demonstration, and Handouts Education comprehension: verbalized understanding and needs further education  HOME EXERCISE PROGRAM: Access Code: [facility] URL: [email or web address] Date: 08/14/2022 Prepared by: Sherlie Ban  Exercises - Romberg Stance Eyes Closed on Foam Pad  - 1-2 x daily - 5 x weekly - 3 sets - 30 hold - Wide Stance with Eyes Closed on Foam Pad  - 1-2 x daily - 5 x weekly - 2 sets - 5 reps - head turns and head nods  - Standing on Foam Pad  - 1 x daily - 5 x weekly - 2 sets - 5 reps head turns and head nods   GOALS: Goals reviewed with patient? Yes  SHORT TERM GOALS: Target date: 08/25/2022  Patient will demonstrate 100% compliance with initial HEP to continue to progress between physical therapy sessions.   Baseline: To be provided Goal status: INITIAL  2.  Therapist will assess SOT and write long term goal as indicated. Baseline: Goal not needed - WNL  Goal status: MET   LONG TERM GOALS: Target date: 09/08/2022  Patient will report demonstrate independence with final HEP in order to maintain current gains and continue to progress after physical therapy discharge.   Baseline: To be provided Goal status: INITIAL  2.  Patient will improve MSQ score < 2/5 points with vertical/horizontal head turns to indicate reduction in motion sensitivity in order to progress towards baseline function.   Baseline: 2/5 for both currently Goal status: INITIAL  3.  Patient will improve FOTO score to 60 to achieve predicted improvements in functional mobility due to skilled  physical therapy interventions to increase safety with  and participation in daily activities.  Baseline: 48 Goal status: INITIAL  4.  SOT to be assessed/goal written as indicated Baseline: Goal not needed - WNL  Goal status: MET  ASSESSMENT:  CLINICAL IMPRESSION: Assessed positional testing with pt negative for BPPV or any motion sensitivities. Provided education on Meneieres disease and provided handout. Performed dynamic gait tasks with EC and head motions with pt with no issues with dizziness or off balance. Pt continues to have most difficulty with EC tasks on compliant surfaces with head nods for vestibular input. Pt tolerated session well, will continue to progress towards LTGs.   OBJECTIVE IMPAIRMENTS: decreased balance and dizziness.   ACTIVITY LIMITATIONS: bending, transfers, reach over head, and quickly moving head  PARTICIPATION LIMITATIONS: cleaning, community activity, and occupation  PERSONAL FACTORS: Time since onset of injury/illness/exacerbation and 3+ comorbidities: see above  are also affecting patient's functional outcome.   REHAB POTENTIAL: Fair Meniere's has mixed response to PT and may be exacerbated by accute flair, anticipate hypofunction may respond well  CLINICAL DECISION MAKING: Evolving/moderate complexity  EVALUATION COMPLEXITY: Moderate   PLAN:  PT FREQUENCY: 1x/week  PT DURATION: 6 weeks  PLANNED INTERVENTIONS: Therapeutic exercises, Therapeutic activity, Neuromuscular re-education, Balance training, Gait training, Patient/Family education, Self Care, Vestibular training, Canalith repositioning, Manual therapy, and Re-evaluation  PLAN FOR NEXT SESSION: add to initial HEP, additional education on Meniere's management, adaptation/habituation exercises. Balance with EC   Drake Leach, PT, DPT 08/24/2022, 11:46 AM

## 2022-08-28 ENCOUNTER — Ambulatory Visit: Payer: 59 | Admitting: Physical Therapy

## 2022-08-28 DIAGNOSIS — R42 Dizziness and giddiness: Secondary | ICD-10-CM | POA: Diagnosis not present

## 2022-08-28 DIAGNOSIS — R2681 Unsteadiness on feet: Secondary | ICD-10-CM | POA: Diagnosis not present

## 2022-08-28 NOTE — Patient Instructions (Signed)
Gaze Stabilization: Standing Feet Apart    Feet shoulder width apart, keeping eyes on target on wall a few feet away, tilt head down 15-30 and move head side to side for 30 seconds. Repeat while moving head up and down for 30 seconds. Perform 2-3 sets of each.   Do _1-2__ sessions per day.  Use target on pattern background. Make sure symptoms don't go above a 5/10   Copyright  VHI. All rights reserved.

## 2022-08-28 NOTE — Therapy (Signed)
OUTPATIENT PHYSICAL THERAPY VESTIBULAR TREATMENT     Patient Name: [name] MRN: [id number] DOB:[birth year], 57 y.o., female Today's Date: 08/28/2022  END OF SESSION:  PT End of Session - 08/28/22 1107     Visit Number 4    Number of Visits 7   including eval   Date for PT Re-Evaluation 09/22/22    Authorization Type [employer]    PT Start Time 1104    PT Stop Time 1145    PT Time Calculation (min) 41 min    Equipment Utilized During Treatment --    Activity Tolerance Patient tolerated treatment well    Behavior During Therapy WFL for tasks assessed/performed               Past Medical History:  Diagnosis Date   Anemia    iron def anemia, iron infusion   Bell's palsy    Celiac disease    Fibroid    GERD (gastroesophageal reflux disease)    History of pneumonia    Hypertension    Iron deficiency anemia    Menorrhagia    Psoriasis    STD (sexually transmitted disease)    HSV1   Vertigo    Past Surgical History:  Procedure Laterality Date   APPENDECTOMY      57 years old   Patient Active Problem List   Diagnosis Date Noted   Left elbow pain 11/07/2018   Left foot pain 11/28/2015   Psoriasis 09/07/2014   Celiac disease 09/04/2013    Class: History of   Iron deficiency anemia 09/23/2010    PCP: Elfredia Nevins, MD REFERRING PROVIDER: Karle Barr, MD  REFERRING DIAG: R42 (ICD-10-CM) - Dizziness   THERAPY DIAG:  Dizziness and giddiness  Unsteadiness on feet  ONSET DATE: 07/18/2022 (referral date)  Rationale for Evaluation and Treatment: Rehabilitation  SUBJECTIVE:   SUBJECTIVE STATEMENT: L ear still feeling full, it has been going on for about a week. Will have these episodes and they will come on and then just go away. One time noticed it with incr nasal congestion, but not noticing it right now. Had to work all weekend. Balance feels worse when the L ear feels off.   Pt accompanied by: self  PERTINENT HISTORY: suspected Meniere's  disease L side, symptoms consistent with R vestibular hypofunction, psoriasis  PAIN:  Are you having pain? No  PRECAUTIONS: Fall  WEIGHT BEARING RESTRICTIONS: No  FALLS: Has patient fallen in last 6 months? No  LIVING ENVIRONMENT: Lives with: lives alone Lives in: House/apartment Stairs: Yes: Internal: 15 steps; on left going up and External: 6 steps; bilateral but cannot reach both Has following equipment at home: Grab bars  PLOF: Independent - Working at the [employer] [employer]   PATIENT GOALS: "Management and improvement of symptoms."  OBJECTIVE:   There were no vitals filed for this visit.   DIAGNOSTIC FINDINGS: No recent relevant imaging  COGNITION: Overall cognitive status: Within functional limits for tasks assessed   SENSATION: WFL  EDEMA:  Abscent  Cervical ROM:   WFL - Painfree  PATIENT SURVEYS:  FOTO 48   VESTIBULAR TREATMENT:  NMR: Gaze Adaptation: x1 Viewing Horizontal: Position: Standing with busy background, Time: 30 seconds, Reps: 2, and Comment: 4/10 dizziness x1 Viewing Vertical:  Position: Standing with busy background, Time: 30 seconds, Reps: 2, and Comment: 4/10 dizziness   On blue foam beam:  wide BOS > more narrow BOS, 3 x 5 reps EC sit <> stands  Tandem gait down and back x3 reps On level ground: tandem gait with head turns down and back x3 reps, pt reporting 6/10 dizziness On thicker blue foam: Feet together EC 2 x 30 seconds static stance, then 10 reps head turns, 10 reps head nods  Pt with improvements with balance with EC  Forward gait with holding ball and making CW and CCW circles, 2 x 20' each direction, pt reporting 5/10 dizziness, with cues for slowed speed and tracking with head and eyes  PATIENT EDUCATION: Education details: Meniere's education and purpose of PT with Menires/vestibular hypofunction, discussed use of a  diuretic for Menieres (pt not currently on one), but talked about maybe discussing this with Dr. Suszanne Conners in the future, progressing EC balance to feet together, addition of VOR x1 to HEP Person educated: Patient Education method: Explanation, Demonstration, and Handouts Education comprehension: verbalized understanding and needs further education  HOME EXERCISE PROGRAM: Standing VOR x1  with busy background x30 seconds  Access Code: [facility] URL: [email or web address] Date: 08/28/2022 Prepared by: Sherlie Ban  Exercises - Romberg Stance Eyes Closed on Foam Pad  - 1-2 x daily - 5 x weekly - 3 sets - 30 hold - Wide Stance with Eyes Closed on Foam Pad  - 1-2 x daily - 5 x weekly - 2 sets - 5 reps - head turns and head nods - Standing on Foam Pad  - 1-2 x daily - 5 x weekly - 2 sets - 5 reps - head turns and head nods  - Tandem Walking with Counter Support  - 1-2 x daily - 5 x weekly - 3 sets  GOALS: Goals reviewed with patient? Yes  SHORT TERM GOALS: Target date: 08/25/2022  Patient will demonstrate 100% compliance with initial HEP to continue to progress between physical therapy sessions.   Baseline: To be provided Goal status: INITIAL  2.  Therapist will assess SOT and write long term goal as indicated. Baseline: Goal not needed - WNL  Goal status: MET   LONG TERM GOALS: Target date: 09/08/2022  Patient will report demonstrate independence with final HEP in order to maintain current gains and continue to progress after physical therapy discharge.   Baseline: To be provided Goal status: INITIAL  2.  Patient will improve MSQ score < 2/5 points with vertical/horizontal head turns to indicate reduction in motion sensitivity in order to progress towards baseline function.   Baseline: 2/5 for both currently Goal status: INITIAL  3.  Patient will improve FOTO score to 60 to achieve predicted improvements in functional mobility due to skilled physical therapy  interventions to increase safety with and participation in daily activities.  Baseline: 48 Goal status: INITIAL  4.  SOT to be assessed/goal written as indicated Baseline: Goal not needed - WNL  Goal status: MET  ASSESSMENT:  CLINICAL IMPRESSION: Today's skilled session continued to focus on balance with EC, head motions, and gaze stabilization. Pt with 4-5/10 dizziness after VOR x1 with a busy background, gaze stabilization with walking, and head turns with tandem gait. Intermittent rest breaks to allow symptoms to subside. Pt with improvements in balance with EC with feet together. No  dizziness with EC tasks. Will continue to progress towards LTGs.    OBJECTIVE IMPAIRMENTS: decreased balance and dizziness.   ACTIVITY LIMITATIONS: bending, transfers, reach over head, and quickly moving head  PARTICIPATION LIMITATIONS: cleaning, community activity, and occupation  PERSONAL FACTORS: Time since onset of injury/illness/exacerbation and 3+ comorbidities: see above  are also affecting patient's functional outcome.   REHAB POTENTIAL: Fair Meniere's has mixed response to PT and may be exacerbated by accute flair, anticipate hypofunction may respond well  CLINICAL DECISION MAKING: Evolving/moderate complexity  EVALUATION COMPLEXITY: Moderate   PLAN:  PT FREQUENCY: 1x/week  PT DURATION: 6 weeks  PLANNED INTERVENTIONS: Therapeutic exercises, Therapeutic activity, Neuromuscular re-education, Balance training, Gait training, Patient/Family education, Self Care, Vestibular training, Canalith repositioning, Manual therapy, and Re-evaluation  PLAN FOR NEXT SESSION:  head motions, additional education on Meniere's management, adaptation/habituation exercises. Balance with EC   Drake Leach, PT, DPT 08/28/2022, 11:57 AM

## 2022-08-29 ENCOUNTER — Other Ambulatory Visit (HOSPITAL_COMMUNITY): Payer: Self-pay

## 2022-08-30 ENCOUNTER — Other Ambulatory Visit (HOSPITAL_COMMUNITY): Payer: Self-pay

## 2022-09-06 ENCOUNTER — Encounter: Payer: Self-pay | Admitting: Physical Therapy

## 2022-09-06 ENCOUNTER — Ambulatory Visit: Payer: 59 | Admitting: Physical Therapy

## 2022-09-06 DIAGNOSIS — R2681 Unsteadiness on feet: Secondary | ICD-10-CM

## 2022-09-06 DIAGNOSIS — R42 Dizziness and giddiness: Secondary | ICD-10-CM

## 2022-09-06 NOTE — Therapy (Signed)
OUTPATIENT PHYSICAL THERAPY VESTIBULAR TREATMENT     Patient Name: [name] MRN: [id number] DOB:[birth year], 57 y.o., female Today's Date: 09/06/2022  END OF SESSION:  PT End of Session - 09/06/22 1104     Visit Number 5    Number of Visits 7   including eval   Date for PT Re-Evaluation 09/22/22    Authorization Type [employer]et[employer]    PT Start Time 1103    PT Stop Time 1145    PT Time Calculation (min) 42 min    Activity Tolerance Patient tolerated treatment well    Behavior During Therapy WFL for tasks assessed/performed               Past Medical History:  Diagnosis Date   Anemia    iron def anemia, iron infusion   Bell's palsy    Celiac disease    Fibroid    GERD (gastroesophageal reflux disease)    History of pneumonia    Hypertension    Iron deficiency anemia    Menorrhagia    Psoriasis    STD (sexually transmitted disease)    HSV1   Vertigo    Past Surgical History:  Procedure Laterality Date   APPENDECTOMY      57 years old   Patient Active Problem List   Diagnosis Date Noted   Left elbow pain 11/07/2018   Left foot pain 11/28/2015   Psoriasis 09/07/2014   Celiac disease 09/04/2013    Class: History of   Iron deficiency anemia 09/23/2010    PCP: Elfredia Nevins, MD REFERRING PROVIDER: Karle Barr, MD  REFERRING DIAG: R42 (ICD-10-CM) - Dizziness   THERAPY DIAG:  Dizziness and giddiness  Unsteadiness on feet  ONSET DATE: 07/18/2022 (referral date)  Rationale for Evaluation and Treatment: Rehabilitation  SUBJECTIVE:   SUBJECTIVE STATEMENT: L ear fullness is feeling better today. Tried the 'X' at home with a busy background and still felt a little dizzy.   Pt accompanied by: self  PERTINENT HISTORY: suspected Meniere's disease L side, symptoms consistent with R vestibular hypofunction, psoriasis  PAIN:  Are you having pain? No  PRECAUTIONS: Fall  WEIGHT BEARING RESTRICTIONS: No  FALLS: Has patient fallen in last 6 months?  No  LIVING ENVIRONMENT: Lives with: lives alone Lives in: House/apartment Stairs: Yes: Internal: 15 steps; on left going up and External: 6 steps; bilateral but cannot reach both Has following equipment at home: Grab bars  PLOF: Independent - Working at the [employer]   PATIENT GOALS: "Management and improvement of symptoms."  OBJECTIVE:   There were no vitals filed for this visit.   DIAGNOSTIC FINDINGS: No recent relevant imaging  COGNITION: Overall cognitive status: Within functional limits for tasks assessed   SENSATION: WFL  EDEMA:  Abscent  Cervical ROM:   WFL - Painfree  PATIENT SURVEYS:  FOTO 48   VESTIBULAR TREATMENT:                              MOTION SENSITIVITY:            Motion Sensitivity Quotient Intensity: 0 = none, 1 = Lightheaded, 2 = Mild, 3 = Moderate, 4 = Severe, 5 = Vomiting  13. Sitting head turns x5 1  14.Sitting head nods x5 1   Standing head turns x5: 1 Standing head nods x5: 1    FOTO: DPS: 58 DFS: 64.7  NMR:  Forward gait with VOR x1 in horizontal direction: 4 x 20', and vertical direction: 4 x 20', with mild dizziness 3/4 tandem with EC x30 seconds bilat, tandem stance with EC 10-20 seconds x3 reps bilat Tandem stance EO: x10 reps head turns side to side On air ex: with feet hip width > more narrow BOS 2 x 10 reps diagonals each way, incr sway with feet closer together and pt needing intermittent UE support for balance 10 reps sit <> stands with EC on blue foam beam with no UE support  On blue foam beam: EC alternating lateral stepping 2 x 10 reps EC feet together x30 seconds, pt steady with this Performed bending and picking up 5 cones with return to upright, no symptoms x2 sets, then performed with bending and head turns 3 sets of 5 reps, pt initially reporting 2-3/10 dizziness and decr to a 1/10 with incr reps. Pt also more off balance  initially, but got better with practice  On level ground: EC marching down and back x2 reps in // bars for slowed SLS, pt veering to L at times   PATIENT EDUCATION: Education details: Results of goals, working on bending with head turns for habituation and purpose of habituation  Person educated: Patient Education method: Medical illustrator Education comprehension: verbalized understanding and needs further education  HOME EXERCISE PROGRAM: Standing VOR x1  with busy background x30 seconds Bending head turns with picking up something from the ground for habituation x5 reps   Access Code: [facility] URL: [email or web address] Date: 08/28/2022 Prepared by: Sherlie Ban  Exercises - Romberg Stance Eyes Closed on Foam Pad  - 1-2 x daily - 5 x weekly - 3 sets - 30 hold - Wide Stance with Eyes Closed on Foam Pad  - 1-2 x daily - 5 x weekly - 2 sets - 5 reps - head turns and head nods - Standing on Foam Pad  - 1-2 x daily - 5 x weekly - 2 sets - 5 reps - head turns and head nods  - Tandem Walking with Counter Support  - 1-2 x daily - 5 x weekly - 3 sets  GOALS: Goals reviewed with patient? Yes  SHORT TERM GOALS: Target date: 08/25/2022  Patient will demonstrate 100% compliance with initial HEP to continue to progress between physical therapy sessions.   Baseline: To be provided Goal status: INITIAL  2.  Therapist will assess SOT and write long term goal as indicated. Baseline: Goal not needed - WNL  Goal status: MET   LONG TERM GOALS: Target date: 09/08/2022 Updated goal date for POC: 09/22/22  Patient will report demonstrate independence with final HEP in order to maintain current gains and continue to progress after physical therapy discharge.   Baseline: To be provided Goal status: INITIAL  2.  Patient will improve MSQ score < 2/5 points with vertical/horizontal head turns to indicate reduction in motion sensitivity in order to progress towards baseline  function.   Baseline: 2/5 for both currently  1/5 on 09/06/22 Goal status: IN PROGRESS  3.  Patient will improve FOTO score to 60 to achieve predicted improvements in functional mobility due to skilled physical therapy interventions to increase safety with and participation in daily activities.  Baseline: 48  58 on 09/06/22 Goal status: IN PROGRESS  4.  SOT to be assessed/goal written as indicated Baseline: Goal not needed - WNL  Goal status: MET  ASSESSMENT:  CLINICAL IMPRESSION: Checked pt's LTGs today with pt with improvements  in MSQ score for head motions and FOTO score. Pt improved FOTO from 48 > 58, indicating improved functional outcomes in regards to dizziness. LTGs marked as in progress and updated goal date. Remainder of symptoms focused on progressing vestibular tasks for balance, VOR, and bending for habituation. Pt with no dizziness with bending with head still, but did have 2-3/10 dizziness symptoms when turning her head (pt has noticed at work this had made her dizzy). With incr reps for habituation, pt reporting decr in symptoms to 1/10 with this tasks and pt also demonstrating improved balance. Will continue per POC.  OBJECTIVE IMPAIRMENTS: decreased balance and dizziness.   ACTIVITY LIMITATIONS: bending, transfers, reach over head, and quickly moving head  PARTICIPATION LIMITATIONS: cleaning, community activity, and occupation  PERSONAL FACTORS: Time since onset of injury/illness/exacerbation and 3+ comorbidities: see above  are also affecting patient's functional outcome.   REHAB POTENTIAL: Fair Meniere's has mixed response to PT and may be exacerbated by accute flair, anticipate hypofunction may respond well  CLINICAL DECISION MAKING: Evolving/moderate complexity  EVALUATION COMPLEXITY: Moderate   PLAN:  PT FREQUENCY: 1x/week  PT DURATION: 6 weeks  PLANNED INTERVENTIONS: Therapeutic exercises, Therapeutic activity, Neuromuscular re-education, Balance  training, Gait training, Patient/Family education, Self Care, Vestibular training, Canalith repositioning, Manual therapy, and Re-evaluation  PLAN FOR NEXT SESSION:  head motions, additional education on Meniere's management, adaptation/habituation exercises. Balance with EC   Drake Leach, PT, DPT 09/06/2022, 11:47 AM

## 2022-09-11 ENCOUNTER — Ambulatory Visit: Payer: 59 | Attending: Otolaryngology | Admitting: Physical Therapy

## 2022-09-11 ENCOUNTER — Encounter: Payer: Self-pay | Admitting: Physical Therapy

## 2022-09-11 DIAGNOSIS — R42 Dizziness and giddiness: Secondary | ICD-10-CM | POA: Insufficient documentation

## 2022-09-11 DIAGNOSIS — R2681 Unsteadiness on feet: Secondary | ICD-10-CM | POA: Insufficient documentation

## 2022-09-11 NOTE — Therapy (Signed)
OUTPATIENT PHYSICAL THERAPY VESTIBULAR TREATMENT     Patient Name: [name] MRN: [id number] DOB:08-Sep-1965, 57 y.o., female Today's Date: 09/11/2022  END OF SESSION:  PT End of Session - 09/11/22 1106     Visit Number 6    Number of Visits 7   including eval   Date for PT Re-Evaluation 09/22/22    Authorization Type [employer]    PT Start Time 1105    PT Stop Time 1145    PT Time Calculation (min) 40 min    Activity Tolerance Patient tolerated treatment well    Behavior During Therapy WFL for tasks assessed/performed               Past Medical History:  Diagnosis Date   Anemia    iron def anemia, iron infusion   Bell's palsy    Celiac disease    Fibroid    GERD (gastroesophageal reflux disease)    History of pneumonia    Hypertension    Iron deficiency anemia    Menorrhagia    Psoriasis    STD (sexually transmitted disease)    HSV1   Vertigo    Past Surgical History:  Procedure Laterality Date   APPENDECTOMY      57 years old   Patient Active Problem List   Diagnosis Date Noted   Left elbow pain 11/07/2018   Left foot pain 11/28/2015   Psoriasis 09/07/2014   Celiac disease 09/04/2013    Class: History of   Iron deficiency anemia 09/23/2010    PCP: Elfredia Nevins, MD REFERRING PROVIDER: Karle Barr, MD  REFERRING DIAG: R42 (ICD-10-CM) - Dizziness   THERAPY DIAG:  Dizziness and giddiness  Unsteadiness on feet  ONSET DATE: 07/18/2022 (referral date)  Rationale for Evaluation and Treatment: Rehabilitation  SUBJECTIVE:   SUBJECTIVE STATEMENT: Had an episode of spinning over the weekend and work when she bent down and turn her head to put on a boot. Ear is not bothersome today   Pt accompanied by: self  PERTINENT HISTORY: suspected Meniere's disease L side, symptoms consistent with R vestibular hypofunction, psoriasis  PAIN:  Are you having pain? No  PRECAUTIONS: Fall  WEIGHT BEARING RESTRICTIONS: No  FALLS: Has patient fallen  in last 6 months? No  LIVING ENVIRONMENT: Lives with: lives alone Lives in: House/apartment Stairs: Yes: Internal: 15 steps; on left going up and External: 6 steps; bilateral but cannot reach both Has following equipment at home: Grab bars  PLOF: Independent - Working at the [employer]   PATIENT GOALS: "Management and improvement of symptoms."  OBJECTIVE:   There were no vitals filed for this visit.   DIAGNOSTIC FINDINGS: No recent relevant imaging  COGNITION: Overall cognitive status: Within functional limits for tasks assessed   SENSATION: WFL  EDEMA:  Abscent  Cervical ROM:   WFL - Painfree  PATIENT SURVEYS:  FOTO 48   VESTIBULAR TREATMENT:                                                                                                NMR:  Gaze Adaptation:  x1 Viewing Horizontal: Position: Standing with busy background, Time: 30 seconds , Reps: 2, and Comment: mild symptoms, feels some wooziness afterwards x1 Viewing Vertical:  Position: Standing with busy background, Time: 30 seconds, Reps: 2, and Comment: some wooziness   On air ex in corner: EC: 1/2 tandem static stance x30 seconds, 1/2 tandem performed x10 reps head turns and x10 reps head nods with each leg posteriorly With feet together EO 2 x 10 reps CW and CCW circles with ball, no issues with this   On blue foam beam for habituation Performed bending and tapping tip of cone and turning head/looking home, performed 4 reps and performed 3 sets with mild symptoms  With busy background on floor, incr speed of picking up 4 cones and return to upright, performed 2 sets, mild symptoms   On blue foam beam: tandem gait down and back x3 reps, then adding in head turns down and back x3 reps, incr unsteadiness with head turns  On thicker foam beam: x10 reps mini squats with EC with feet more close together    PATIENT EDUCATION: Education details: Continue HEP, gave pt busier background for home  for standing VOR exercises  Person educated: Patient Education method: Programmer, multimedia, Demonstration, and Handouts Education comprehension: verbalized understanding and needs further education  HOME EXERCISE PROGRAM: Standing VOR x1  with busy background x30 seconds Bending head turns with picking up something from the ground for habituation x5 reps   Access Code: [facility] URL: [email or web address] Date: 08/28/2022 Prepared by: Sherlie Ban  Exercises - Romberg Stance Eyes Closed on Foam Pad  - 1-2 x daily - 5 x weekly - 3 sets - 30 hold - Wide Stance with Eyes Closed on Foam Pad  - 1-2 x daily - 5 x weekly - 2 sets - 5 reps - head turns and head nods - Standing on Foam Pad  - 1-2 x daily - 5 x weekly - 2 sets - 5 reps - head turns and head nods  - Tandem Walking with Counter Support  - 1-2 x daily - 5 x weekly - 3 sets  GOALS: Goals reviewed with patient? Yes  SHORT TERM GOALS: Target date: 08/25/2022  Patient will demonstrate 100% compliance with initial HEP to continue to progress between physical therapy sessions.   Baseline: To be provided Goal status: INITIAL  2.  Therapist will assess SOT and write long term goal as indicated. Baseline: Goal not needed - WNL  Goal status: MET   LONG TERM GOALS: Target date: 09/08/2022 Updated goal date for POC: 09/22/22  Patient will report demonstrate independence with final HEP in order to maintain current gains and continue to progress after physical therapy discharge.   Baseline: To be provided Goal status: INITIAL  2.  Patient will improve MSQ score < 2/5 points with vertical/horizontal head turns to indicate reduction in motion sensitivity in order to progress towards baseline function.   Baseline: 2/5 for both currently  1/5 on 09/06/22 Goal status: IN PROGRESS  3.  Patient will improve FOTO score to 60 to achieve predicted improvements in functional mobility due to skilled physical therapy interventions to  increase safety with and participation in daily activities.  Baseline: 48  58 on 09/06/22 Goal status: IN PROGRESS  4.  SOT to be assessed/goal written as indicated Baseline: Goal not needed - WNL  Goal status: MET  ASSESSMENT:  CLINICAL IMPRESSION: Today's skilled session focused on progressing VOR exercises, vestibular exercises with EC, and habituation to bending. Pt  with mild symptoms with bending and turning head as well as with busy background for VOR. Gave pt a more visually challenging background for home for VOR tasks as pt's current background does not provoke any symptoms. Will continue per POC.  OBJECTIVE IMPAIRMENTS: decreased balance and dizziness.   ACTIVITY LIMITATIONS: bending, transfers, reach over head, and quickly moving head  PARTICIPATION LIMITATIONS: cleaning, community activity, and occupation  PERSONAL FACTORS: Time since onset of injury/illness/exacerbation and 3+ comorbidities: see above  are also affecting patient's functional outcome.   REHAB POTENTIAL: Fair Meniere's has mixed response to PT and may be exacerbated by accute flair, anticipate hypofunction may respond well  CLINICAL DECISION MAKING: Evolving/moderate complexity  EVALUATION COMPLEXITY: Moderate   PLAN:  PT FREQUENCY: 1x/week  PT DURATION: 6 weeks  PLANNED INTERVENTIONS: Therapeutic exercises, Therapeutic activity, Neuromuscular re-education, Balance training, Gait training, Patient/Family education, Self Care, Vestibular training, Canalith repositioning, Manual therapy, and Re-evaluation  PLAN FOR NEXT SESSION:  goals, plan for D/C?    Drake Leach, PT, DPT 09/11/2022, 12:02 PM

## 2022-09-20 ENCOUNTER — Encounter: Payer: Self-pay | Admitting: Physical Therapy

## 2022-09-20 ENCOUNTER — Ambulatory Visit: Payer: 59 | Admitting: Physical Therapy

## 2022-09-20 DIAGNOSIS — R2681 Unsteadiness on feet: Secondary | ICD-10-CM

## 2022-09-20 DIAGNOSIS — R42 Dizziness and giddiness: Secondary | ICD-10-CM | POA: Diagnosis not present

## 2022-09-20 NOTE — Therapy (Signed)
OUTPATIENT PHYSICAL THERAPY VESTIBULAR TREATMENT/DISCHARGE SUMMARY     Patient Name: [name] MRN: [id number] DOB:[birth year], 57 y.o., female Today's Date: 09/20/2022  END OF SESSION:  PT End of Session - 09/20/22 1104     Visit Number 7    Number of Visits 7   including eval   Date for PT Re-Evaluation 09/22/22    Authorization Type [employer]    PT Start Time 1103    PT Stop Time 1128   full time not used due to D/C visit   PT Time Calculation (min) 25 min    Activity Tolerance Patient tolerated treatment well    Behavior During Therapy WFL for tasks assessed/performed               Past Medical History:  Diagnosis Date   Anemia    iron def anemia, iron infusion   Bell's palsy    Celiac disease    Fibroid    GERD (gastroesophageal reflux disease)    History of pneumonia    Hypertension    Iron deficiency anemia    Menorrhagia    Psoriasis    STD (sexually transmitted disease)    HSV1   Vertigo    Past Surgical History:  Procedure Laterality Date   APPENDECTOMY      57 years old   Patient Active Problem List   Diagnosis Date Noted   Left elbow pain 11/07/2018   Left foot pain 11/28/2015   Psoriasis 09/07/2014   Celiac disease 09/04/2013    Class: History of   Iron deficiency anemia 09/23/2010    PCP: Elfredia Nevins, MD REFERRING PROVIDER: Karle Barr, MD  REFERRING DIAG: R42 (ICD-10-CM) - Dizziness   THERAPY DIAG:  Dizziness and giddiness  Unsteadiness on feet  ONSET DATE: 07/18/2022 (referral date)  Rationale for Evaluation and Treatment: Rehabilitation  SUBJECTIVE:   SUBJECTIVE STATEMENT: Is sick today has a cold. Tested negative for COVID twice. Does not have a fever. Feels like there is some improvement, but still with some difficulty with eyes closed and busy backgrounds   Pt accompanied by: self  PERTINENT HISTORY: suspected Meniere's disease L side, symptoms consistent with R vestibular hypofunction, psoriasis  PAIN:   Are you having pain? No  PRECAUTIONS: Fall  WEIGHT BEARING RESTRICTIONS: No  FALLS: Has patient fallen in last 6 months? No  LIVING ENVIRONMENT: Lives with: lives alone Lives in: House/apartment Stairs: Yes: Internal: 15 steps; on left going up and External: 6 steps; bilateral but cannot reach both Has following equipment at home: Grab bars  PLOF: Independent - Working at the [employer]   PATIENT GOALS: "Management and improvement of symptoms."  OBJECTIVE:   There were no vitals filed for this visit.   DIAGNOSTIC FINDINGS: No recent relevant imaging  COGNITION: Overall cognitive status: Within functional limits for tasks assessed   SENSATION: WFL  EDEMA:  Abscent  Cervical ROM:   WFL - Painfree  PATIENT SURVEYS:  FOTO 48   VESTIBULAR TREATMENT:            Reviewed entirety of HEP (see below for further details) for vestibular input for balance, also discussed can progressed VOR from 30 seconds with busy background to 45 seconds          FOTO: DPS: 58, DFS: 64.7   PATIENT EDUCATION: Education details: Reviewed HEP and to continue to perform, FOTO results, Menires education Person educated: Patient Education method: Explanation and Demonstration Education comprehension: verbalized understanding and needs  further education  HOME EXERCISE PROGRAM: Standing VOR x1  with busy background x30 seconds Bending head turns with picking up something from the ground for habituation x5 reps  3/4 tandem with EC on air ex for 30 seconds  Marching EC on foam with head turns   Access Code: [facility] URL: [email or web address] Date: 09/20/2022 Prepared by: Sherlie Ban  Exercises - Romberg Stance Eyes Closed on Foam Pad  - 1-2 x daily - 5 x weekly - 3 sets - 30 hold - Tandem Walking with Counter Support  - 1-2 x daily - 5 x weekly - 3 sets - Romberg Stance on Foam Pad  - 1-2 x daily - 5 x weekly - 2 sets - 10 reps - 10 reps head turns,  10 reps head nods  - Romberg Stance with Head Nods on Foam Pad  - 1 x daily - 5 x weekly - 2 sets - 10 reps - EC 10 reps head turns, 10 reps head nods  PHYSICAL THERAPY DISCHARGE SUMMARY  Visits from Start of Care: 7  Current functional level related to goals / functional outcomes: See LTGs/Clinical Assessment Statement   Remaining deficits: Mild dizziness with bending, busy backgrounds, difficulties with balance with EC    Education / Equipment: HEP,   Menires education  Patient agrees to discharge. Patient goals were partially met. Patient is being discharged due to maximized rehab potential.    GOALS: Goals reviewed with patient? Yes  SHORT TERM GOALS: Target date: 08/25/2022  Patient will demonstrate 100% compliance with initial HEP to continue to progress between physical therapy sessions.   Baseline: To be provided Goal status: INITIAL  2.  Therapist will assess SOT and write long term goal as indicated. Baseline: Goal not needed - WNL  Goal status: MET   LONG TERM GOALS: Target date: 09/08/2022 Updated goal date for POC: 09/22/22  Patient will report demonstrate independence with final HEP in order to maintain current gains and continue to progress after physical therapy discharge.   Baseline: To be provided Goal status: MET  2.  Patient will improve MSQ score < 2/5 points with vertical/horizontal head turns to indicate reduction in motion sensitivity in order to progress towards baseline function.   Baseline: 2/5 for both currently  1/5 on 09/06/22 Goal status: PARTIALLY MET  3.  Patient will improve FOTO score to 60 to achieve predicted improvements in functional mobility due to skilled physical therapy interventions to increase safety with and participation in daily activities.  Baseline: 48  58 on 09/06/22, 58 on 8/14 Goal status: PARTIALLY MET  4.  SOT to be assessed/goal written as indicated Baseline: Goal not needed - WNL  Goal status:  MET  ASSESSMENT:  CLINICAL IMPRESSION: Today's skilled session focused on assessing pt's LTGs for anticipated D/C. Pt has either partially met or met LTGs. Pt has improved FOTO score to 58, but not quite to goal level. Pt has been performing HEP for vestibular input for balance with EC and VOR. Pt verbalizes understanding of Menires education and will continue to perform HEP for vestibular hypofunction. Pt will be discharged at this time with pt in agreement with plan.  OBJECTIVE IMPAIRMENTS: decreased balance and dizziness.   ACTIVITY LIMITATIONS: bending, transfers, reach over head, and quickly moving head  PARTICIPATION LIMITATIONS: cleaning, community activity, and occupation  PERSONAL FACTORS: Time since onset of injury/illness/exacerbation and 3+ comorbidities: see above  are also affecting patient's functional outcome.   REHAB POTENTIAL: Fair Meniere's has mixed response to  PT and may be exacerbated by accute flair, anticipate hypofunction may respond well  CLINICAL DECISION MAKING: Evolving/moderate complexity  EVALUATION COMPLEXITY: Moderate   PLAN:  PT FREQUENCY: 1x/week  PT DURATION: 6 weeks  PLANNED INTERVENTIONS: Therapeutic exercises, Therapeutic activity, Neuromuscular re-education, Balance training, Gait training, Patient/Family education, Self Care, Vestibular training, Canalith repositioning, Manual therapy, and Re-evaluation  PLAN FOR NEXT SESSION: D/C   Drake Leach, PT, DPT 09/20/2022, 11:44 AM

## 2022-09-27 ENCOUNTER — Other Ambulatory Visit (HOSPITAL_COMMUNITY): Payer: Self-pay

## 2022-09-29 ENCOUNTER — Other Ambulatory Visit (HOSPITAL_COMMUNITY): Payer: Self-pay

## 2022-10-04 ENCOUNTER — Ambulatory Visit: Payer: 59 | Admitting: Radiology

## 2022-10-20 ENCOUNTER — Other Ambulatory Visit (HOSPITAL_COMMUNITY): Payer: Self-pay

## 2022-10-22 ENCOUNTER — Other Ambulatory Visit (HOSPITAL_BASED_OUTPATIENT_CLINIC_OR_DEPARTMENT_OTHER): Payer: Self-pay

## 2022-10-23 ENCOUNTER — Other Ambulatory Visit: Payer: Self-pay

## 2022-10-23 ENCOUNTER — Other Ambulatory Visit (HOSPITAL_BASED_OUTPATIENT_CLINIC_OR_DEPARTMENT_OTHER): Payer: Self-pay

## 2022-10-23 MED ORDER — LOSARTAN POTASSIUM 50 MG PO TABS
50.0000 mg | ORAL_TABLET | Freq: Two times a day (BID) | ORAL | 0 refills | Status: DC
  Filled 2022-10-23: qty 180, 90d supply, fill #0

## 2022-10-23 MED ORDER — FAMOTIDINE 40 MG PO TABS
40.0000 mg | ORAL_TABLET | Freq: Every day | ORAL | 0 refills | Status: DC
  Filled 2022-10-23: qty 90, 90d supply, fill #0

## 2022-10-25 ENCOUNTER — Other Ambulatory Visit: Payer: Self-pay

## 2022-10-28 ENCOUNTER — Encounter (HOSPITAL_COMMUNITY): Payer: Self-pay

## 2022-10-31 ENCOUNTER — Other Ambulatory Visit: Payer: Self-pay

## 2022-11-24 ENCOUNTER — Other Ambulatory Visit (HOSPITAL_COMMUNITY): Payer: Self-pay

## 2022-11-29 ENCOUNTER — Other Ambulatory Visit: Payer: Self-pay

## 2022-11-29 ENCOUNTER — Other Ambulatory Visit (HOSPITAL_BASED_OUTPATIENT_CLINIC_OR_DEPARTMENT_OTHER): Payer: Self-pay

## 2022-11-29 MED ORDER — ALPRAZOLAM 0.25 MG PO TABS
0.2500 mg | ORAL_TABLET | Freq: Four times a day (QID) | ORAL | 4 refills | Status: AC
  Filled 2022-11-29: qty 120, 30d supply, fill #0
  Filled 2023-01-02: qty 120, 30d supply, fill #1
  Filled 2023-02-07: qty 120, 30d supply, fill #2
  Filled 2023-03-22: qty 120, 30d supply, fill #3
  Filled 2023-05-10: qty 120, 30d supply, fill #0

## 2022-11-29 MED ORDER — FAMOTIDINE 40 MG PO TABS
40.0000 mg | ORAL_TABLET | Freq: Every day | ORAL | 3 refills | Status: DC
  Filled 2022-11-29 – 2023-04-10 (×4): qty 90, 90d supply, fill #0
  Filled 2023-07-03: qty 90, 90d supply, fill #1
  Filled 2023-10-17 – 2023-11-14 (×2): qty 90, 90d supply, fill #2

## 2022-11-29 MED ORDER — LOSARTAN POTASSIUM 50 MG PO TABS
50.0000 mg | ORAL_TABLET | Freq: Two times a day (BID) | ORAL | 3 refills | Status: DC
  Filled 2022-11-29 – 2023-04-10 (×3): qty 180, 90d supply, fill #0
  Filled 2023-07-19: qty 180, 90d supply, fill #1
  Filled 2023-10-17: qty 180, 90d supply, fill #2

## 2022-11-29 MED ORDER — SPIRONOLACTONE 25 MG PO TABS
25.0000 mg | ORAL_TABLET | Freq: Every day | ORAL | 11 refills | Status: DC
  Filled 2022-11-29: qty 30, 30d supply, fill #0
  Filled 2023-06-11: qty 30, 30d supply, fill #1
  Filled 2023-09-08: qty 30, 30d supply, fill #2
  Filled 2023-10-17: qty 30, 30d supply, fill #3
  Filled 2023-11-14: qty 30, 30d supply, fill #4

## 2022-11-29 MED ORDER — ATORVASTATIN CALCIUM 40 MG PO TABS
40.0000 mg | ORAL_TABLET | Freq: Every day | ORAL | 3 refills | Status: DC
  Filled 2022-11-29: qty 90, 90d supply, fill #0
  Filled 2023-06-11: qty 90, 90d supply, fill #1

## 2022-12-14 ENCOUNTER — Ambulatory Visit: Payer: 59 | Admitting: Radiology

## 2022-12-14 ENCOUNTER — Other Ambulatory Visit (HOSPITAL_COMMUNITY): Payer: Self-pay

## 2022-12-14 ENCOUNTER — Encounter: Payer: Self-pay | Admitting: Radiology

## 2022-12-14 ENCOUNTER — Other Ambulatory Visit (HOSPITAL_COMMUNITY)
Admission: RE | Admit: 2022-12-14 | Discharge: 2022-12-14 | Disposition: A | Discharge status: Home / Self Care | Payer: 59 | Source: Ambulatory Visit | Attending: Radiology | Admitting: Radiology

## 2022-12-14 VITALS — BP 124/82 | Ht 63.5 in | Wt 189.0 lb

## 2022-12-14 DIAGNOSIS — N959 Unspecified menopausal and perimenopausal disorder: Secondary | ICD-10-CM | POA: Diagnosis not present

## 2022-12-14 DIAGNOSIS — Z01419 Encounter for gynecological examination (general) (routine) without abnormal findings: Secondary | ICD-10-CM | POA: Insufficient documentation

## 2022-12-14 MED ORDER — PROGESTERONE MICRONIZED 100 MG PO CAPS
100.0000 mg | ORAL_CAPSULE | Freq: Every evening | ORAL | 4 refills | Status: DC
  Filled 2022-12-14 – 2022-12-15 (×2): qty 90, 90d supply, fill #0
  Filled 2023-03-12: qty 90, 90d supply, fill #1
  Filled 2023-06-11: qty 90, 90d supply, fill #2
  Filled 2023-09-18: qty 90, 90d supply, fill #3
  Filled 2023-09-24: qty 90, 90d supply, fill #4

## 2022-12-14 MED ORDER — ESTRADIOL 0.05 MG/24HR TD PTTW
1.0000 | MEDICATED_PATCH | TRANSDERMAL | 4 refills | Status: DC
  Filled 2022-12-14 – 2022-12-15 (×2): qty 24, 84d supply, fill #0

## 2022-12-14 NOTE — Progress Notes (Signed)
   [name] [birth year] [id number]   History: Postmenopausal 57 y.o. presents for annual exam. C/o hot flashes, hair loss, fatigue, joint pain. Interested in HRT. Not sexually active. 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   Gynecologic History Postmenopausal Last Pap: 2019. Results were: normal Last mammogram: 11/23. Results were: normal Last colonoscopy: 12/2016 HRT use: no  Obstetric History OB History  Gravida Para Term Preterm AB Living  0 0 0 0 0 0  SAB IAB Ectopic Multiple Live Births  0 0 0 0       The following portions of the patient's history were reviewed and updated as appropriate: allergies, current medications, past family history, past medical history, past social history, past surgical history, and problem list.  Review of Systems Pertinent items noted in HPI and remainder of comprehensive ROS otherwise negative.  Past medical history, past surgical history, family history and social history were all reviewed and documented in the EPIC chart.  Exam:  Vitals:   12/14/22 1326  BP: 124/82  Weight: 189 lb (85.7 kg)  Height: 5' 3.5" (1.613 m)   Body mass index is 32.95 kg/m.  General appearance:  Normal Thyroid:  Symmetrical, normal in size, without palpable masses or nodularity. Respiratory  Auscultation:  Clear without wheezing or rhonchi Cardiovascular  Auscultation:  Regular rate, without rubs, murmurs or gallops  Edema/varicosities:  Not grossly evident Abdominal  Soft,nontender, without masses, guarding or rebound.  Liver/spleen:  No organomegaly noted  Hernia:  None appreciated  Skin  Inspection:  Grossly normal Breasts: Examined lying and sitting.   Right: Without masses, retractions, nipple discharge or axillary adenopathy.   Left: Without masses, retractions, nipple discharge or axillary adenopathy. Genitourinary   Inguinal/mons:  Normal without inguinal adenopathy  External genitalia:  Normal appearing vulva with no masses, tenderness,  or lesions  BUS/Urethra/Skene's glands:  Normal  Vagina:  Normal appearing with normal color and discharge, no lesions. Atrophy: mild   Cervix:  Normal appearing without discharge or lesions  Uterus:  Normal in size, shape and contour.  Midline and mobile, nontender  Adnexa/parametria:     Rt: Normal in size, without masses or tenderness.   Lt: Normal in size, without masses or tenderness.  Anus and perineum: Normal    Patient informed chaperone available to be present for breast and pelvic exam. Patient has requested no chaperone to be present. Patient has been advised what will be completed during breast and pelvic exam.   Assessment/Plan:   1. Well woman exam with routine gynecological exam - Cytology - PAP( [place])  2. Menopausal and postmenopausal disorder Open to starting HRT to managed symptoms. Risks and benefits reviewed - estradiol (VIVELLE-DOT) 0.05 MG/24HR patch; Place 1 patch (0.05 mg total) onto the skin 2 (two) times a week.  Dispense: 24 patch; Refill: 4 - progesterone (PROMETRIUM) 100 MG capsule; Take 1 capsule (100 mg total) by mouth at bedtime.  Dispense: 90 capsule; Refill: 4     Discussed SBE, colonoscopy and DEXA screening as directed. Recommend of exercise weekly, including weight bearing exercise. Encouraged the use of seatbelts and sunscreen.  Return in 1 year for annual or sooner prn.  Arlie Solomons B WHNP-BC, 2:19 PM 12/14/2022

## 2022-12-15 ENCOUNTER — Other Ambulatory Visit (HOSPITAL_COMMUNITY): Payer: Self-pay

## 2022-12-15 ENCOUNTER — Encounter: Payer: Self-pay | Admitting: Pharmacist

## 2022-12-15 ENCOUNTER — Other Ambulatory Visit: Payer: Self-pay

## 2022-12-18 ENCOUNTER — Other Ambulatory Visit (HOSPITAL_COMMUNITY): Payer: Self-pay

## 2022-12-18 DIAGNOSIS — H52203 Unspecified astigmatism, bilateral: Secondary | ICD-10-CM | POA: Diagnosis not present

## 2022-12-20 ENCOUNTER — Other Ambulatory Visit (HOSPITAL_COMMUNITY): Payer: Self-pay

## 2022-12-20 LAB — CYTOLOGY - PAP
Adequacy: ABSENT
Comment: NEGATIVE
Diagnosis: NEGATIVE
High risk HPV: NEGATIVE

## 2022-12-27 DIAGNOSIS — R7309 Other abnormal glucose: Secondary | ICD-10-CM | POA: Diagnosis not present

## 2022-12-27 DIAGNOSIS — I10 Essential (primary) hypertension: Secondary | ICD-10-CM | POA: Diagnosis not present

## 2022-12-27 DIAGNOSIS — E2839 Other primary ovarian failure: Secondary | ICD-10-CM | POA: Diagnosis not present

## 2022-12-27 DIAGNOSIS — E7849 Other hyperlipidemia: Secondary | ICD-10-CM | POA: Diagnosis not present

## 2022-12-27 DIAGNOSIS — L659 Nonscarring hair loss, unspecified: Secondary | ICD-10-CM | POA: Diagnosis not present

## 2022-12-27 DIAGNOSIS — Z0000 Encounter for general adult medical examination without abnormal findings: Secondary | ICD-10-CM | POA: Diagnosis not present

## 2022-12-28 ENCOUNTER — Other Ambulatory Visit (HOSPITAL_BASED_OUTPATIENT_CLINIC_OR_DEPARTMENT_OTHER): Payer: Self-pay

## 2022-12-28 DIAGNOSIS — D2271 Melanocytic nevi of right lower limb, including hip: Secondary | ICD-10-CM | POA: Diagnosis not present

## 2022-12-28 DIAGNOSIS — D2272 Melanocytic nevi of left lower limb, including hip: Secondary | ICD-10-CM | POA: Diagnosis not present

## 2022-12-28 DIAGNOSIS — L570 Actinic keratosis: Secondary | ICD-10-CM | POA: Diagnosis not present

## 2022-12-28 DIAGNOSIS — L918 Other hypertrophic disorders of the skin: Secondary | ICD-10-CM | POA: Diagnosis not present

## 2022-12-28 DIAGNOSIS — L400 Psoriasis vulgaris: Secondary | ICD-10-CM | POA: Diagnosis not present

## 2022-12-28 DIAGNOSIS — L718 Other rosacea: Secondary | ICD-10-CM | POA: Diagnosis not present

## 2022-12-28 DIAGNOSIS — L821 Other seborrheic keratosis: Secondary | ICD-10-CM | POA: Diagnosis not present

## 2022-12-28 MED ORDER — ATORVASTATIN CALCIUM 40 MG PO TABS
40.0000 mg | ORAL_TABLET | Freq: Every day | ORAL | 3 refills | Status: DC
  Filled 2022-12-28 – 2023-09-08 (×2): qty 90, 90d supply, fill #0
  Filled 2023-12-12: qty 90, 90d supply, fill #1

## 2022-12-29 ENCOUNTER — Other Ambulatory Visit: Payer: Self-pay

## 2023-01-02 ENCOUNTER — Other Ambulatory Visit (HOSPITAL_BASED_OUTPATIENT_CLINIC_OR_DEPARTMENT_OTHER): Payer: Self-pay

## 2023-01-02 ENCOUNTER — Other Ambulatory Visit: Payer: Self-pay

## 2023-01-11 ENCOUNTER — Other Ambulatory Visit (HOSPITAL_BASED_OUTPATIENT_CLINIC_OR_DEPARTMENT_OTHER): Payer: Self-pay

## 2023-01-11 ENCOUNTER — Other Ambulatory Visit (HOSPITAL_COMMUNITY): Payer: Self-pay

## 2023-01-15 ENCOUNTER — Ambulatory Visit (HOSPITAL_BASED_OUTPATIENT_CLINIC_OR_DEPARTMENT_OTHER)
Admission: RE | Admit: 2023-01-15 | Discharge: 2023-01-15 | Disposition: A | Discharge status: Home / Self Care | Payer: 59 | Source: Ambulatory Visit | Attending: Internal Medicine | Admitting: Internal Medicine

## 2023-01-15 ENCOUNTER — Encounter (HOSPITAL_BASED_OUTPATIENT_CLINIC_OR_DEPARTMENT_OTHER): Payer: Self-pay | Admitting: Radiology

## 2023-01-15 DIAGNOSIS — Z1231 Encounter for screening mammogram for malignant neoplasm of breast: Secondary | ICD-10-CM | POA: Diagnosis not present

## 2023-01-28 ENCOUNTER — Telehealth: Payer: Self-pay | Admitting: Urgent Care

## 2023-01-28 MED ORDER — PREDNISONE 20 MG PO TABS
40.0000 mg | ORAL_TABLET | Freq: Every day | ORAL | 0 refills | Status: DC
  Filled 2023-01-28: qty 10, 5d supply, fill #0

## 2023-01-28 NOTE — Telephone Encounter (Signed)
Patient is having an allergic rhinitis flare. Recommended prednisone for 5 days.

## 2023-01-29 ENCOUNTER — Other Ambulatory Visit (HOSPITAL_BASED_OUTPATIENT_CLINIC_OR_DEPARTMENT_OTHER): Payer: Self-pay

## 2023-02-07 ENCOUNTER — Other Ambulatory Visit (HOSPITAL_BASED_OUTPATIENT_CLINIC_OR_DEPARTMENT_OTHER): Payer: Self-pay

## 2023-02-08 ENCOUNTER — Other Ambulatory Visit: Payer: Self-pay

## 2023-02-08 ENCOUNTER — Other Ambulatory Visit (HOSPITAL_COMMUNITY): Payer: Self-pay

## 2023-02-08 ENCOUNTER — Other Ambulatory Visit (HOSPITAL_BASED_OUTPATIENT_CLINIC_OR_DEPARTMENT_OTHER): Payer: Self-pay

## 2023-02-08 DIAGNOSIS — R21 Rash and other nonspecific skin eruption: Secondary | ICD-10-CM | POA: Diagnosis not present

## 2023-02-08 DIAGNOSIS — L280 Lichen simplex chronicus: Secondary | ICD-10-CM | POA: Diagnosis not present

## 2023-02-08 MED ORDER — BETAMETHASONE DIPROPIONATE 0.05 % EX OINT
1.0000 | TOPICAL_OINTMENT | Freq: Two times a day (BID) | CUTANEOUS | 0 refills | Status: DC
  Filled 2023-02-08: qty 45, 23d supply, fill #0
  Filled 2023-02-09: qty 45, 30d supply, fill #0

## 2023-02-09 ENCOUNTER — Other Ambulatory Visit (HOSPITAL_COMMUNITY): Payer: Self-pay

## 2023-02-09 ENCOUNTER — Other Ambulatory Visit: Payer: Self-pay

## 2023-02-09 ENCOUNTER — Encounter: Payer: Self-pay | Admitting: Pharmacist

## 2023-02-10 ENCOUNTER — Other Ambulatory Visit (HOSPITAL_COMMUNITY): Payer: Self-pay

## 2023-02-22 ENCOUNTER — Other Ambulatory Visit: Payer: Self-pay

## 2023-02-22 ENCOUNTER — Other Ambulatory Visit (HOSPITAL_COMMUNITY): Payer: Self-pay

## 2023-02-22 ENCOUNTER — Other Ambulatory Visit: Payer: Self-pay | Admitting: Pharmacist

## 2023-02-22 ENCOUNTER — Encounter (HOSPITAL_COMMUNITY): Payer: Self-pay

## 2023-02-22 ENCOUNTER — Encounter: Payer: Self-pay | Admitting: Pharmacist

## 2023-02-22 ENCOUNTER — Ambulatory Visit: Payer: 59 | Attending: Internal Medicine | Admitting: Pharmacist

## 2023-02-22 ENCOUNTER — Other Ambulatory Visit (HOSPITAL_COMMUNITY): Payer: Self-pay | Admitting: Pharmacy Technician

## 2023-02-22 DIAGNOSIS — Z79899 Other long term (current) drug therapy: Secondary | ICD-10-CM | POA: Diagnosis not present

## 2023-02-22 DIAGNOSIS — L2089 Other atopic dermatitis: Secondary | ICD-10-CM | POA: Diagnosis not present

## 2023-02-22 MED ORDER — DUPIXENT 300 MG/2ML ~~LOC~~ SOAJ: SUBCUTANEOUS | 0 refills | Status: DC

## 2023-02-22 MED ORDER — DUPIXENT 300 MG/2ML ~~LOC~~ SOAJ
SUBCUTANEOUS | 0 refills | Status: DC
  Filled 2023-02-23: qty 4, fill #0
  Filled 2023-02-26: qty 4, 14d supply, fill #0

## 2023-02-22 MED ORDER — DUPIXENT 300 MG/2ML ~~LOC~~ SOAJ: SUBCUTANEOUS | 11 refills | Status: DC

## 2023-02-22 MED ORDER — TACROLIMUS 0.1 % EX OINT
TOPICAL_OINTMENT | CUTANEOUS | 11 refills | Status: AC
  Filled 2023-02-22 – 2023-02-23 (×2): qty 30, 30d supply, fill #0
  Filled 2023-07-07: qty 30, 15d supply, fill #0

## 2023-02-22 MED ORDER — DUPIXENT 300 MG/2ML ~~LOC~~ SOAJ
SUBCUTANEOUS | 11 refills | Status: DC
  Filled 2023-02-23: qty 4, fill #0
  Filled 2023-03-06: qty 4, 28d supply, fill #0
  Filled 2023-03-28: qty 4, 28d supply, fill #1
  Filled 2023-05-01: qty 4, 28d supply, fill #2
  Filled 2023-05-30: qty 4, 28d supply, fill #3
  Filled 2023-06-27: qty 4, 28d supply, fill #4
  Filled 2023-07-27: qty 4, 28d supply, fill #5
  Filled 2023-08-21: qty 4, 28d supply, fill #6
  Filled 2023-09-18 (×2): qty 4, 28d supply, fill #7
  Filled 2023-10-22: qty 4, 28d supply, fill #8
  Filled 2023-11-20 – 2023-11-22 (×2): qty 4, 28d supply, fill #9
  Filled 2023-12-14: qty 4, 28d supply, fill #10
  Filled 2024-01-07: qty 4, 28d supply, fill #11

## 2023-02-22 NOTE — Progress Notes (Signed)
   S: Patient presents for review of their specialty medication therapy.  Patient is about to start taking Dupixent for atopic dermatitis. Patient is managed by Dr. Yetta Barre for this.   Adherence: has not yet started   Efficacy: has not yet started   Dosing: 600 mg LD x1 followed by 300 mg subcutaneously once every 14 days.  Dose adjustments: Renal: no dose adjustments (has not been studied) Hepatic: no dose adjustments (has not been studied)  Drug-drug interactions: none identified   Monitoring: S/sx of infection: none S/sx of hypersensitivity: none  S/sx of ocular effects: none  S/sx of eosinophilia/vasculitis: none  O:     Lab Results  Component Value Date   WBC 5.6 01/03/2022   HGB 14.3 01/03/2022   HCT 42.6 01/03/2022   MCV 86.8 01/03/2022   PLT 190 01/03/2022      Chemistry      Component Value Date/Time   NA 141 01/03/2022 0923   K 3.7 01/03/2022 0923   CL 108 01/03/2022 0923   CO2 25 01/03/2022 0923   BUN 15 01/03/2022 0923   CREATININE 0.69 01/03/2022 0923      Component Value Date/Time   CALCIUM 9.3 01/03/2022 0923   ALKPHOS 86 03/01/2013 1650   AST 24 03/01/2013 1650   ALT 24 03/01/2013 1650   BILITOT 0.8 03/01/2013 1650       A/P: 1. Medication review: Patient currently on Dupixent for atopic dermatitis. Reviewed the medication with the patient, including the following: Dupixent is a monoclonal antibody used for the treatment of asthma or atopic dermatitis. Patient educated on purpose, proper use and potential adverse effects of Dupixent. Possible adverse effects include increased risk of infection, ocular effects, vasculitis/eosinophilia, and hypersensitivity reactions. Administer as a SubQ injection and rotate sites. Allow the medication to reach room temp prior to administration (45 mins for 300 mg syringe or 30 min for 200 mg syringe). Do not shake. Discard any unused portion. No recommendations for any changes.  Butch Penny, PharmD, Patsy [place],  CPP Clinical Pharmacist [facility] [phone]

## 2023-02-22 NOTE — Progress Notes (Signed)
Please see OV from 02/22/2023 for full documentation.   Butch Penny, PharmD, Patsy [place], CPP Clinical Pharmacist [facility] [phone]

## 2023-02-23 ENCOUNTER — Encounter: Payer: Self-pay | Admitting: Pharmacist

## 2023-02-23 ENCOUNTER — Other Ambulatory Visit: Payer: Self-pay

## 2023-02-23 ENCOUNTER — Other Ambulatory Visit (HOSPITAL_COMMUNITY): Payer: Self-pay

## 2023-02-23 NOTE — Progress Notes (Signed)
Pharmacy Patient Advocate Encounter   Received notification from Patient Pharmacy that prior authorization for Dupixent is required/requested.   Insurance verification completed.   The patient is insured through [facility] .   Per test claim: PA required; PA submitted to above mentioned insurance via CoverMyMeds Key/confirmation #/EOC [facility] Status is pending

## 2023-02-26 ENCOUNTER — Other Ambulatory Visit: Payer: Self-pay

## 2023-02-26 DIAGNOSIS — L309 Dermatitis, unspecified: Secondary | ICD-10-CM | POA: Diagnosis not present

## 2023-02-26 DIAGNOSIS — Z79899 Other long term (current) drug therapy: Secondary | ICD-10-CM | POA: Diagnosis not present

## 2023-02-26 DIAGNOSIS — L2089 Other atopic dermatitis: Secondary | ICD-10-CM | POA: Diagnosis not present

## 2023-02-26 NOTE — Progress Notes (Signed)
  Specialty Pharmacy Initial Fill Coordination Note  [name] is a 58 y.o. female contacted today regarding initial fill of specialty medication(s) Dupilumab (Dupixent)   Patient requested [name] at [facility] at [place] date: 02/27/23   Medication will be filled on 1/20.   Patient is aware of $0 copayment.

## 2023-02-26 NOTE — Progress Notes (Signed)
Pharmacy Patient Advocate Encounter  Received notification from [facility] that Prior Authorization for Dupixent has been APPROVED from 02/23/23 to 08/29/23   PA #/Case ID/Reference #: [id number]

## 2023-02-27 ENCOUNTER — Other Ambulatory Visit (HOSPITAL_COMMUNITY): Payer: Self-pay

## 2023-02-27 ENCOUNTER — Other Ambulatory Visit: Payer: Self-pay | Admitting: Radiology

## 2023-02-27 ENCOUNTER — Other Ambulatory Visit: Payer: Self-pay

## 2023-02-27 DIAGNOSIS — N959 Unspecified menopausal and perimenopausal disorder: Secondary | ICD-10-CM

## 2023-02-27 MED ORDER — ESTRADIOL 0.075 MG/24HR TD PTTW
1.0000 | MEDICATED_PATCH | TRANSDERMAL | 0 refills | Status: DC
  Filled 2023-02-27: qty 8, 28d supply, fill #0
  Filled 2023-02-27: qty 24, 84d supply, fill #0

## 2023-02-28 ENCOUNTER — Other Ambulatory Visit: Payer: Self-pay

## 2023-02-28 DIAGNOSIS — N959 Unspecified menopausal and perimenopausal disorder: Secondary | ICD-10-CM

## 2023-02-28 MED ORDER — ESTRADIOL 0.075 MG/24HR TD PTTW: 1.0000 | MEDICATED_PATCH | TRANSDERMAL | 0 refills | Status: DC

## 2023-02-28 NOTE — Telephone Encounter (Signed)
Patient is asking for estradiol patch to be sent to [facility] [place].

## 2023-03-01 ENCOUNTER — Other Ambulatory Visit (HOSPITAL_COMMUNITY): Payer: Self-pay

## 2023-03-01 ENCOUNTER — Other Ambulatory Visit: Payer: Self-pay

## 2023-03-06 ENCOUNTER — Other Ambulatory Visit: Payer: Self-pay

## 2023-03-06 ENCOUNTER — Other Ambulatory Visit (HOSPITAL_COMMUNITY): Payer: Self-pay

## 2023-03-06 NOTE — Progress Notes (Signed)
Specialty Pharmacy Refill Coordination Note  [name] is a 58 y.o. female contacted today regarding refills of specialty medication(s) Dupilumab (Dupixent)   Patient requested [name] at [facility] at [place] date: 03/08/23   Medication will be filled on 03/08/23.

## 2023-03-08 ENCOUNTER — Other Ambulatory Visit (HOSPITAL_BASED_OUTPATIENT_CLINIC_OR_DEPARTMENT_OTHER): Payer: Self-pay

## 2023-03-08 ENCOUNTER — Other Ambulatory Visit: Payer: Self-pay

## 2023-03-09 ENCOUNTER — Other Ambulatory Visit (HOSPITAL_BASED_OUTPATIENT_CLINIC_OR_DEPARTMENT_OTHER): Payer: Self-pay

## 2023-03-15 ENCOUNTER — Other Ambulatory Visit (HOSPITAL_BASED_OUTPATIENT_CLINIC_OR_DEPARTMENT_OTHER): Payer: Self-pay

## 2023-03-16 DIAGNOSIS — C44222 Squamous cell carcinoma of skin of right ear and external auricular canal: Secondary | ICD-10-CM | POA: Diagnosis not present

## 2023-03-16 DIAGNOSIS — Z85828 Personal history of other malignant neoplasm of skin: Secondary | ICD-10-CM | POA: Diagnosis not present

## 2023-03-22 ENCOUNTER — Other Ambulatory Visit: Payer: Self-pay

## 2023-03-28 ENCOUNTER — Other Ambulatory Visit: Payer: Self-pay

## 2023-03-28 NOTE — Progress Notes (Signed)
Specialty Pharmacy Refill Coordination Note  [name] is a 58 y.o. female contacted today regarding refills of specialty medication(s) Dupilumab (Dupixent)   Patient requested [name] at [facility] at [place] date: 04/04/23   Medication will be filled on 02.25.25.

## 2023-04-04 ENCOUNTER — Other Ambulatory Visit (HOSPITAL_COMMUNITY): Payer: Self-pay

## 2023-04-04 MED ORDER — COVID-19 MRNA VAC-TRIS(PFIZER) 30 MCG/0.3ML IM SUSY
PREFILLED_SYRINGE | INTRAMUSCULAR | 0 refills | Status: DC
  Filled 2023-04-04: qty 0.3, 1d supply, fill #0

## 2023-04-09 ENCOUNTER — Other Ambulatory Visit: Payer: Self-pay

## 2023-04-09 ENCOUNTER — Other Ambulatory Visit (HOSPITAL_COMMUNITY): Payer: Self-pay

## 2023-04-09 MED ORDER — FUROSEMIDE 20 MG PO TABS
20.0000 mg | ORAL_TABLET | Freq: Every day | ORAL | 3 refills | Status: DC
  Filled 2023-04-09 (×2): qty 90, 90d supply, fill #0
  Filled 2023-07-07 – 2023-07-16 (×2): qty 90, 90d supply, fill #1
  Filled 2023-10-17: qty 90, 90d supply, fill #2

## 2023-04-09 MED ORDER — ONDANSETRON 8 MG PO TBDP
8.0000 mg | ORAL_TABLET | Freq: Two times a day (BID) | ORAL | 0 refills | Status: AC | PRN
  Filled 2023-04-09: qty 10, 5d supply, fill #0

## 2023-04-09 MED ORDER — PROCHLORPERAZINE MALEATE 10 MG PO TABS
10.0000 mg | ORAL_TABLET | Freq: Four times a day (QID) | ORAL | 0 refills | Status: AC
  Filled 2023-04-09: qty 20, 5d supply, fill #0

## 2023-04-10 ENCOUNTER — Other Ambulatory Visit (HOSPITAL_COMMUNITY): Payer: Self-pay

## 2023-04-10 ENCOUNTER — Other Ambulatory Visit: Payer: Self-pay

## 2023-04-30 NOTE — Telephone Encounter (Signed)
 Tiffany -can you review and advise.

## 2023-05-01 ENCOUNTER — Other Ambulatory Visit: Payer: Self-pay

## 2023-05-01 DIAGNOSIS — N959 Unspecified menopausal and perimenopausal disorder: Secondary | ICD-10-CM

## 2023-05-01 MED ORDER — ESTRADIOL 0.075 MG/24HR TD PTTW: 1.0000 | MEDICATED_PATCH | TRANSDERMAL | 0 refills | Status: DC

## 2023-05-01 NOTE — Telephone Encounter (Signed)
 Med refill request: Vivelle-dot  Last AEX: 12/14/22 Next AEX: not scheduled  Last MMG (if hormonal med) 01/15/23 birads cat 1 neg  Refill authorized: Last rx 03/01/23 #24 with 0 refills. Please approve or deny

## 2023-05-01 NOTE — Progress Notes (Signed)
 Specialty Pharmacy Refill Coordination Note  [name] is a 58 y.o. female contacted today regarding refills of specialty medication(s) Dupilumab (Dupixent)   Patient requested (Patient-Rptd) Pickup at [facility] at [place] date: (Patient-Rptd) 05/11/23   Medication will be filled on 05/10/23.

## 2023-05-03 ENCOUNTER — Other Ambulatory Visit (HOSPITAL_COMMUNITY): Payer: Self-pay

## 2023-05-03 DIAGNOSIS — Z85828 Personal history of other malignant neoplasm of skin: Secondary | ICD-10-CM | POA: Diagnosis not present

## 2023-05-03 DIAGNOSIS — C44222 Squamous cell carcinoma of skin of right ear and external auricular canal: Secondary | ICD-10-CM | POA: Diagnosis not present

## 2023-05-03 MED ORDER — DOXYCYCLINE HYCLATE 100 MG PO CAPS
100.0000 mg | ORAL_CAPSULE | Freq: Two times a day (BID) | ORAL | 0 refills | Status: DC
  Filled 2023-05-03: qty 10, 5d supply, fill #0

## 2023-05-10 ENCOUNTER — Other Ambulatory Visit (HOSPITAL_COMMUNITY): Payer: Self-pay

## 2023-05-29 ENCOUNTER — Other Ambulatory Visit: Payer: Self-pay

## 2023-05-29 ENCOUNTER — Other Ambulatory Visit (HOSPITAL_COMMUNITY): Payer: Self-pay

## 2023-05-30 ENCOUNTER — Other Ambulatory Visit: Payer: Self-pay | Admitting: Pharmacy Technician

## 2023-05-30 ENCOUNTER — Other Ambulatory Visit: Payer: Self-pay

## 2023-05-30 NOTE — Progress Notes (Signed)
 Specialty Pharmacy Refill Coordination Note  [name] is a 58 y.o. female contacted today regarding refills of specialty medication(s) Dupilumab  (Dupixent )   Patient requested (Patient-Rptd) Pickup at [facility] at [facility] date: (Patient-Rptd) 06/08/23   Medication will be filled on 06/08/23.

## 2023-06-26 DIAGNOSIS — N959 Unspecified menopausal and perimenopausal disorder: Secondary | ICD-10-CM

## 2023-06-26 MED ORDER — ESTRADIOL 0.075 MG/24HR TD PTTW
1.0000 | MEDICATED_PATCH | TRANSDERMAL | 1 refills | Status: DC
  Filled 2023-07-31: qty 24, 84d supply, fill #0
  Filled 2023-10-17: qty 24, 84d supply, fill #1

## 2023-06-26 NOTE — Telephone Encounter (Signed)
 Med refill request:estradiol  0.075 mg patch twice weekly Last AEX: 12/14/22 --JC Next AEX: Not scheduled  Last MMG (if hormonal med) 01/15/23: BiRads 1 neg  Rx pended #24/1RF until AEX due MyChart message to patient advising to schedule next AEX 12/2023.   Refill authorized: Please Advise?

## 2023-06-27 ENCOUNTER — Other Ambulatory Visit: Payer: Self-pay

## 2023-06-27 NOTE — Progress Notes (Signed)
 Specialty Pharmacy Refill Coordination Note  [name] is a 58 y.o. female contacted today regarding refills of specialty medication(s) Dupilumab  (Dupixent )   Patient requested (Patient-Rptd) Pickup at [facility] at [facility] date: (Patient-Rptd) 07/06/23   Medication will be filled on 07/05/23.

## 2023-07-03 DIAGNOSIS — L718 Other rosacea: Secondary | ICD-10-CM | POA: Diagnosis not present

## 2023-07-03 DIAGNOSIS — Z85828 Personal history of other malignant neoplasm of skin: Secondary | ICD-10-CM | POA: Diagnosis not present

## 2023-07-03 DIAGNOSIS — L2089 Other atopic dermatitis: Secondary | ICD-10-CM | POA: Diagnosis not present

## 2023-07-04 ENCOUNTER — Telehealth

## 2023-07-04 DIAGNOSIS — M109 Gout, unspecified: Secondary | ICD-10-CM

## 2023-07-05 ENCOUNTER — Other Ambulatory Visit (HOSPITAL_BASED_OUTPATIENT_CLINIC_OR_DEPARTMENT_OTHER): Payer: Self-pay

## 2023-07-05 ENCOUNTER — Other Ambulatory Visit: Payer: Self-pay

## 2023-07-05 MED ORDER — PREDNISONE 20 MG PO TABS
40.0000 mg | ORAL_TABLET | Freq: Every day | ORAL | 0 refills | Status: AC
  Filled 2023-07-05: qty 14, 7d supply, fill #0

## 2023-07-05 NOTE — Progress Notes (Signed)
E-Visit for Gout Symptoms  We are sorry that you are not feeling well. We are here to help!  Based on what you shared with me it looks like you have a flare of your gout.  Gout is a form of arthritis. It can cause pain and swelling in the joints. At first, it tends to affect only 1 joint - most frequently the big toe. It happens in people who have too much uric acid in the blood. Uric acid is a chemical that is produced when the body breaks down certain foods. Uric acid can form sharp needle-like crystals that build up in the joints and cause pain. Uric acid crystals can also form inside the tubes that carry urine from the kidneys to the bladder. These crystals can turn into "kidney stones" that can cause pain and problems with the flow of urine. People with gout get sudden "flares" or attacks of severe pain, most often the big toe, ankle, or knee. Often the joint also turns red and swells. Usually, only 1 joint is affected, but some people have pain in more than 1 joint. Gout flares tend to happen more often during the night.  The pain from gout can be extreme. The pain and swelling are worst at the beginning of a gout flare. The symptoms then get better within a few days to weeks. It is not clear how the body "turns off" a gout flare.  Do not start any NEW preventative medicine until the gout has cleared completely. However, If you are already on Probenecid or Allopurinol for CHRONIC gout, you may continue taking this during an active flare up  I have prescribed Prednisone 40 mg daily for 7 days.    HOME CARE Losing weight can help relieve gout. It's not clear that following a specific diet plan will help with gout symptoms but eating a balanced diet can help improve your overall health. It can also help you lose weight, if you are overweight. In general, a healthy diet includes plenty of fruits, vegetables, whole grains, and low-fat dairy products (labelled "low fat", skim, 2%). Avoid sugar  sweetened drinks (including sodas, tea, juice and juice blends, coffee drinks and sports drinks) Limit alcohol to 1-2 drinks of beer, spirits or wine daily these can make gout flares worse. Some people with gout also have other health problems, such as heart disease, high blood pressure, kidney disease, or obesity. If you have any of these issues, it's important to work with your doctor to manage them. This can help improve your overall health and might also help with your gout.  GET HELP RIGHT AWAY IF: Your symptoms persist after you have completed your treatment plan You develop severe diarrhea You develop abnormal sensations  You develop vomiting,   You develop weakness  You develop abdominal pain  FOLLOW UP WITH YOUR PRIMARY PROVIDER IF: If your symptoms do not improve within 10 days  MAKE SURE YOU  Understand these instructions. Will watch your condition. Will get help right away if you are not doing well or get worse.  Thank you for choosing an e-visit.  Your e-visit answers were reviewed by a board certified advanced clinical practitioner to complete your personal care plan. Depending upon the condition, your plan could have included both over the counter or prescription medications.  Please review your pharmacy choice. Make sure the pharmacy is open so you can pick up prescription now. If there is a problem, you may contact your provider through MyChart messaging   and have the prescription routed to another pharmacy.  Your safety is important to us. If you have drug allergies check your prescription carefully.   For the next 24 hours you can use MyChart to ask questions about today's visit, request a non-urgent call back, or ask for a work or school excuse. You will get an email in the next two days asking about your experience. I hope that your e-visit has been valuable and will speed your recovery.  

## 2023-07-05 NOTE — Progress Notes (Signed)
 I have spent 5 minutes in review of e-visit questionnaire, review and updating patient chart, medical decision making and response to patient.   Piedad Climes, PA-C

## 2023-07-07 ENCOUNTER — Other Ambulatory Visit (HOSPITAL_COMMUNITY): Payer: Self-pay

## 2023-07-09 ENCOUNTER — Other Ambulatory Visit: Payer: Self-pay

## 2023-07-16 ENCOUNTER — Other Ambulatory Visit (HOSPITAL_COMMUNITY): Payer: Self-pay

## 2023-07-16 DIAGNOSIS — L259 Unspecified contact dermatitis, unspecified cause: Secondary | ICD-10-CM | POA: Diagnosis not present

## 2023-07-19 DIAGNOSIS — L232 Allergic contact dermatitis due to cosmetics: Secondary | ICD-10-CM | POA: Diagnosis not present

## 2023-07-20 ENCOUNTER — Other Ambulatory Visit (HOSPITAL_COMMUNITY): Payer: Self-pay

## 2023-07-27 ENCOUNTER — Other Ambulatory Visit: Payer: Self-pay

## 2023-07-27 ENCOUNTER — Encounter (INDEPENDENT_AMBULATORY_CARE_PROVIDER_SITE_OTHER): Payer: Self-pay

## 2023-07-27 NOTE — Progress Notes (Signed)
 Specialty Pharmacy Refill Coordination Note  [name] is a 58 y.o. female contacted today regarding refills of specialty medication(s) Dupilumab  (Dupixent )   Patient requested [place] Dk at [facility] at [place] date: 08/03/23   Medication will be filled on 08/02/23.

## 2023-07-31 ENCOUNTER — Other Ambulatory Visit (HOSPITAL_COMMUNITY): Payer: Self-pay

## 2023-08-02 ENCOUNTER — Other Ambulatory Visit: Payer: Self-pay

## 2023-08-08 ENCOUNTER — Other Ambulatory Visit (HOSPITAL_COMMUNITY): Payer: Self-pay

## 2023-08-08 MED ORDER — ATORVASTATIN CALCIUM 40 MG PO TABS
40.0000 mg | ORAL_TABLET | Freq: Every day | ORAL | 3 refills | Status: AC
  Filled 2023-09-18 – 2023-12-12 (×4): qty 90, 90d supply, fill #0

## 2023-08-08 MED ORDER — SPIRONOLACTONE 25 MG PO TABS
25.0000 mg | ORAL_TABLET | Freq: Every day | ORAL | 11 refills | Status: DC
  Filled 2023-08-08: qty 30, 30d supply, fill #0
  Filled 2023-12-12: qty 30, 30d supply, fill #1

## 2023-08-08 MED ORDER — FAMOTIDINE 40 MG PO TABS
40.0000 mg | ORAL_TABLET | Freq: Every day | ORAL | 3 refills | Status: DC
  Filled 2023-12-12 – 2023-12-14 (×2): qty 90, 90d supply, fill #0

## 2023-08-08 MED ORDER — ALPRAZOLAM 0.5 MG PO TABS
0.5000 mg | ORAL_TABLET | Freq: Four times a day (QID) | ORAL | 4 refills | Status: DC
  Filled 2023-08-08: qty 120, 30d supply, fill #0
  Filled 2023-09-08: qty 120, 30d supply, fill #1
  Filled 2023-10-12: qty 120, 30d supply, fill #2
  Filled 2023-11-14: qty 120, 30d supply, fill #3
  Filled 2023-12-12 – 2023-12-13 (×2): qty 120, 30d supply, fill #4

## 2023-08-08 MED ORDER — LOSARTAN POTASSIUM 50 MG PO TABS
50.0000 mg | ORAL_TABLET | Freq: Two times a day (BID) | ORAL | 3 refills | Status: AC
  Filled 2024-01-14: qty 180, 90d supply, fill #0

## 2023-08-08 MED ORDER — FUROSEMIDE 20 MG PO TABS
20.0000 mg | ORAL_TABLET | Freq: Every day | ORAL | 3 refills | Status: AC
  Filled 2024-01-14: qty 90, 90d supply, fill #0

## 2023-08-21 ENCOUNTER — Other Ambulatory Visit (HOSPITAL_COMMUNITY): Payer: Self-pay

## 2023-08-21 ENCOUNTER — Encounter (INDEPENDENT_AMBULATORY_CARE_PROVIDER_SITE_OTHER): Payer: Self-pay

## 2023-08-21 ENCOUNTER — Other Ambulatory Visit: Payer: Self-pay

## 2023-08-21 NOTE — Progress Notes (Signed)
 Specialty Pharmacy Refill Coordination Note  MyChart Questionnaire Submission  [name] is a 58 y.o. female contacted today regarding refills of specialty medication(s) Dupixent .  Injection date: (Patient-Rptd) 08/31/23    Patient requested: (Patient-Rptd) Pickup at [facility] at [facility] date: (Patient-Rptd) 08/31/23  Medication will be filled on 08/30/23.

## 2023-08-30 ENCOUNTER — Other Ambulatory Visit: Payer: Self-pay

## 2023-09-10 ENCOUNTER — Other Ambulatory Visit (HOSPITAL_COMMUNITY): Payer: Self-pay

## 2023-09-10 ENCOUNTER — Other Ambulatory Visit: Payer: Self-pay

## 2023-09-18 ENCOUNTER — Other Ambulatory Visit: Payer: Self-pay

## 2023-09-18 ENCOUNTER — Other Ambulatory Visit (HOSPITAL_BASED_OUTPATIENT_CLINIC_OR_DEPARTMENT_OTHER): Payer: Self-pay

## 2023-09-18 ENCOUNTER — Other Ambulatory Visit (HOSPITAL_COMMUNITY): Payer: Self-pay

## 2023-09-18 NOTE — Progress Notes (Signed)
 Specialty Pharmacy Refill Coordination Note  [name] is a 58 y.o. female contacted today regarding refills of specialty medication(s) Dupilumab  (Dupixent )   Patient requested (Patient-Rptd) Pickup at [facility] at [facility] date: (Patient-Rptd) 09/28/23   Medication will be filled on 09/27/23.   Patient was sent a mychart message informing her that her specialty medication requires a prior authorization. Authorization information was sent to their prescriber and his may delay the planned pick up and to follow up with the pharmacy for additional information.

## 2023-09-19 ENCOUNTER — Other Ambulatory Visit (HOSPITAL_COMMUNITY): Payer: Self-pay

## 2023-09-20 ENCOUNTER — Telehealth: Payer: Self-pay

## 2023-09-20 ENCOUNTER — Other Ambulatory Visit: Payer: Self-pay

## 2023-09-20 ENCOUNTER — Other Ambulatory Visit (HOSPITAL_COMMUNITY): Payer: Self-pay

## 2023-09-20 NOTE — Progress Notes (Signed)
 PA pending

## 2023-09-20 NOTE — Progress Notes (Signed)
 PA approved.

## 2023-09-20 NOTE — Telephone Encounter (Signed)
 Pharmacy Patient Advocate Encounter  Received notification from [facility] that Prior Authorization for Dupixent  has been APPROVED from 09/20/23 to 09/18/24   PA #/Case ID/Reference #: [id number]

## 2023-09-20 NOTE — Telephone Encounter (Signed)
 Pharmacy Patient Advocate Encounter   Received notification from Patient Pharmacy that prior authorization for Dupixent  is required/requested.   Insurance verification completed.   The patient is insured through [facility] .   Per test claim: PA required; PA submitted to above mentioned insurance via Latent Key/confirmation #/EOC [id number] Status is pending

## 2023-09-24 ENCOUNTER — Other Ambulatory Visit (HOSPITAL_COMMUNITY): Payer: Self-pay

## 2023-09-24 ENCOUNTER — Other Ambulatory Visit: Payer: Self-pay

## 2023-09-24 MED ORDER — ONDANSETRON HCL 4 MG PO TABS
4.0000 mg | ORAL_TABLET | Freq: Four times a day (QID) | ORAL | 1 refills | Status: DC
  Filled 2023-09-24 – 2023-10-17 (×2): qty 120, 15d supply, fill #0
  Filled 2023-11-14: qty 120, 15d supply, fill #1

## 2023-09-24 MED ORDER — FAMOTIDINE 40 MG PO TABS
40.0000 mg | ORAL_TABLET | Freq: Two times a day (BID) | ORAL | 3 refills | Status: AC
  Filled 2023-09-24: qty 180, 90d supply, fill #0
  Filled 2024-01-08 – 2024-03-11 (×7): qty 180, 90d supply, fill #1

## 2023-09-24 MED ORDER — ONDANSETRON HCL 4 MG PO TABS
4.0000 mg | ORAL_TABLET | Freq: Four times a day (QID) | ORAL | 0 refills | Status: AC
  Filled 2023-09-24: qty 120, 15d supply, fill #0

## 2023-09-26 ENCOUNTER — Other Ambulatory Visit (HOSPITAL_COMMUNITY): Payer: Self-pay

## 2023-10-12 ENCOUNTER — Other Ambulatory Visit: Payer: Self-pay

## 2023-10-18 ENCOUNTER — Other Ambulatory Visit: Payer: Self-pay

## 2023-10-18 ENCOUNTER — Other Ambulatory Visit (HOSPITAL_COMMUNITY): Payer: Self-pay

## 2023-10-18 ENCOUNTER — Other Ambulatory Visit: Payer: Self-pay | Admitting: Obstetrics and Gynecology

## 2023-10-18 DIAGNOSIS — N959 Unspecified menopausal and perimenopausal disorder: Secondary | ICD-10-CM

## 2023-10-18 MED ORDER — ESTRADIOL 0.075 MG/24HR TD PTTW
1.0000 | MEDICATED_PATCH | TRANSDERMAL | 0 refills | Status: DC
  Filled 2023-10-18: qty 24, 84d supply, fill #0

## 2023-10-18 NOTE — Telephone Encounter (Signed)
 Med refill request: estradiol  (vivelle -dot) 0.075 mg/24hr Last dispensed: 08/02/23 - #24 with 0 refills Last AEX: 12/14/22 Next AEX: 12/18/23 Last MMG (if hormonal med): 01/15/23 Refill authorized? Please Advise.

## 2023-10-19 ENCOUNTER — Other Ambulatory Visit (HOSPITAL_COMMUNITY): Payer: Self-pay

## 2023-10-22 ENCOUNTER — Encounter (INDEPENDENT_AMBULATORY_CARE_PROVIDER_SITE_OTHER): Payer: Self-pay

## 2023-10-22 ENCOUNTER — Other Ambulatory Visit: Payer: Self-pay

## 2023-10-22 NOTE — Progress Notes (Signed)
 Specialty Pharmacy Refill Coordination Note  [name] is a 58 y.o. female contacted today regarding refills of specialty medication(s) Dupilumab  (Dupixent )   Patient requested (Patient-Rptd) Pickup at [facility] at [facility] date: (Patient-Rptd) 10/26/23   Medication will be filled on 10/25/23.

## 2023-10-24 ENCOUNTER — Other Ambulatory Visit: Payer: Self-pay

## 2023-11-15 ENCOUNTER — Other Ambulatory Visit: Payer: Self-pay

## 2023-11-15 ENCOUNTER — Other Ambulatory Visit (HOSPITAL_COMMUNITY): Payer: Self-pay

## 2023-11-20 ENCOUNTER — Other Ambulatory Visit (HOSPITAL_COMMUNITY): Payer: Self-pay

## 2023-11-22 ENCOUNTER — Other Ambulatory Visit: Payer: Self-pay

## 2023-11-22 NOTE — Progress Notes (Signed)
 Specialty Pharmacy Refill Coordination Note  [name] is a 58 y.o. female contacted today regarding refills of specialty medication(s) Dupilumab  (Dupixent )   Patient requested [name] at [facility] at [place] date: 11/23/23   Medication will be filled on 11/22/23.

## 2023-11-23 ENCOUNTER — Other Ambulatory Visit: Payer: Self-pay

## 2023-11-26 ENCOUNTER — Other Ambulatory Visit (HOSPITAL_COMMUNITY): Payer: Self-pay

## 2023-11-26 ENCOUNTER — Other Ambulatory Visit: Payer: Self-pay

## 2023-11-26 NOTE — Progress Notes (Signed)
 Specialty Pharmacy Ongoing Clinical Assessment Note  [name] is a 58 y.o. female who is being followed by the specialty pharmacy service for RxSp Atopic Dermatitis   Patient's specialty medication(s) reviewed today: Dupilumab  (Dupixent )   Missed doses in the last 4 weeks: 0   Patient/Caregiver did not have any additional questions or concerns.   Therapeutic benefit summary: Patient is achieving benefit   Adverse events/side effects summary: No adverse events/side effects   Patient's therapy is appropriate to: Continue    Goals Addressed             This Visit's Progress    Maintain optimal adherence to therapy   On track    Patient is on track. Patient will maintain adherence         Follow up: 12 months  [facility]

## 2023-12-09 DIAGNOSIS — N950 Postmenopausal bleeding: Secondary | ICD-10-CM

## 2023-12-10 NOTE — Telephone Encounter (Signed)
 Call placed to patient, left detailed message, ok per dpr. Advised per Dr. Nikki, return call to office on 12/11/23 at [phone], opt 4 to further discuss and schedule.

## 2023-12-10 NOTE — Telephone Encounter (Signed)
 I would recommend she increase her progesterone  to 200mg  nightly and schedule u/s only. We will address further at her visit with me next week.

## 2023-12-11 ENCOUNTER — Other Ambulatory Visit (HOSPITAL_COMMUNITY): Payer: Self-pay

## 2023-12-11 ENCOUNTER — Other Ambulatory Visit: Payer: Self-pay

## 2023-12-11 MED ORDER — PROGESTERONE 200 MG PO CAPS
200.0000 mg | ORAL_CAPSULE | Freq: Every day | ORAL | 3 refills | Status: AC
  Filled 2023-12-11: qty 90, 90d supply, fill #0
  Filled 2024-03-08 – 2024-03-11 (×2): qty 90, 90d supply, fill #1

## 2023-12-11 NOTE — Telephone Encounter (Signed)
 If they can get the 200mg  we can do that, otherwise 2 (100mg ) caps are fine.

## 2023-12-11 NOTE — Telephone Encounter (Signed)
 Spoke with patient, advised per Jami. Patient agreeable to plan. PUS scheduled for 12/12/23 at 0930. Returning for AEX on 12/18/23 with JC. Patient will not have enough of current RX to double, will need new RX, pharmacy updated to [facility].   Order placed for PUS.   Patient verbalizes understanding and is agreeable.   Jami -please advise on RX? Do you want to change this to 200 mg cap or keep as 100 mg?

## 2023-12-11 NOTE — Progress Notes (Signed)
 Dose change to 200mg  progesterone  cap

## 2023-12-12 ENCOUNTER — Ambulatory Visit (INDEPENDENT_AMBULATORY_CARE_PROVIDER_SITE_OTHER)

## 2023-12-12 ENCOUNTER — Other Ambulatory Visit: Payer: Self-pay | Admitting: Radiology

## 2023-12-12 ENCOUNTER — Other Ambulatory Visit (HOSPITAL_COMMUNITY): Payer: Self-pay

## 2023-12-12 ENCOUNTER — Other Ambulatory Visit: Payer: Self-pay

## 2023-12-12 DIAGNOSIS — N950 Postmenopausal bleeding: Secondary | ICD-10-CM | POA: Diagnosis not present

## 2023-12-13 ENCOUNTER — Other Ambulatory Visit: Payer: Self-pay

## 2023-12-14 ENCOUNTER — Other Ambulatory Visit: Payer: Self-pay

## 2023-12-14 ENCOUNTER — Other Ambulatory Visit (HOSPITAL_COMMUNITY): Payer: Self-pay

## 2023-12-14 ENCOUNTER — Other Ambulatory Visit: Payer: Self-pay | Admitting: Pharmacy Technician

## 2023-12-14 ENCOUNTER — Encounter (INDEPENDENT_AMBULATORY_CARE_PROVIDER_SITE_OTHER): Payer: Self-pay

## 2023-12-14 NOTE — Progress Notes (Signed)
 Specialty Pharmacy Refill Coordination Note  [name] is a 58 y.o. female contacted today regarding refills of specialty medication(s) Dupilumab  (Dupixent )   Patient requested (Patient-Rptd) Pickup at [facility] at [facility] date: (Patient-Rptd) 12/17/23   Medication will be filled on: 12/14/2023

## 2023-12-18 ENCOUNTER — Ambulatory Visit (INDEPENDENT_AMBULATORY_CARE_PROVIDER_SITE_OTHER): Admitting: Radiology

## 2023-12-18 ENCOUNTER — Encounter: Payer: Self-pay | Admitting: Radiology

## 2023-12-18 ENCOUNTER — Encounter: Payer: Self-pay | Admitting: Family Medicine

## 2023-12-18 ENCOUNTER — Ambulatory Visit (INDEPENDENT_AMBULATORY_CARE_PROVIDER_SITE_OTHER): Admitting: Family Medicine

## 2023-12-18 ENCOUNTER — Other Ambulatory Visit (HOSPITAL_COMMUNITY)
Admission: RE | Admit: 2023-12-18 | Discharge: 2023-12-18 | Disposition: A | Discharge status: Home / Self Care | Source: Ambulatory Visit | Attending: Radiology | Admitting: Radiology

## 2023-12-18 VITALS — BP 128/90 | HR 69 | Temp 97.6°F | Ht 63.5 in | Wt 190.8 lb

## 2023-12-18 VITALS — BP 122/76 | HR 78 | Ht 63.5 in | Wt 188.0 lb

## 2023-12-18 DIAGNOSIS — R600 Localized edema: Secondary | ICD-10-CM

## 2023-12-18 DIAGNOSIS — Z6832 Body mass index (BMI) 32.0-32.9, adult: Secondary | ICD-10-CM | POA: Diagnosis not present

## 2023-12-18 DIAGNOSIS — N858 Other specified noninflammatory disorders of uterus: Secondary | ICD-10-CM | POA: Diagnosis not present

## 2023-12-18 DIAGNOSIS — L409 Psoriasis, unspecified: Secondary | ICD-10-CM

## 2023-12-18 DIAGNOSIS — E6609 Other obesity due to excess calories: Secondary | ICD-10-CM

## 2023-12-18 DIAGNOSIS — K900 Celiac disease: Secondary | ICD-10-CM

## 2023-12-18 DIAGNOSIS — E66811 Obesity, class 1: Secondary | ICD-10-CM

## 2023-12-18 DIAGNOSIS — R9389 Abnormal findings on diagnostic imaging of other specified body structures: Secondary | ICD-10-CM | POA: Diagnosis not present

## 2023-12-18 DIAGNOSIS — R7303 Prediabetes: Secondary | ICD-10-CM | POA: Diagnosis not present

## 2023-12-18 DIAGNOSIS — K760 Fatty (change of) liver, not elsewhere classified: Secondary | ICD-10-CM

## 2023-12-18 DIAGNOSIS — D509 Iron deficiency anemia, unspecified: Secondary | ICD-10-CM | POA: Diagnosis not present

## 2023-12-18 DIAGNOSIS — E559 Vitamin D deficiency, unspecified: Secondary | ICD-10-CM | POA: Diagnosis not present

## 2023-12-18 DIAGNOSIS — Z136 Encounter for screening for cardiovascular disorders: Secondary | ICD-10-CM | POA: Diagnosis not present

## 2023-12-18 NOTE — Progress Notes (Signed)
      ENDOMETRIAL BIOPSY       [name] 58 y.o. presents for endometrial biopsy. Reason for biopsy: thickened endometrium.   The indications for endometrial biopsy were reviewed.    Risks of the biopsy including cramping, bleeding, infection, uterine perforation, inadequate specimen and need for additional procedures  were discussed.  The patient states she understands and agrees to undergo procedure today. Consent obtained.  Time out was performed.    Narrative & Impression Indication: Post menopausal bleeding   Vaginal ultrasound   Anteverted uterus Enlarged 8.4cm subserosal left lateral fibroid   Thickened endometrium 8.5 mm, avascular ?Submucosal fibroid noted No masses or thickening seen   Right ovary atrophic, left ovary not seen   No adnexal masses   No free fluid   Impression: fibroids, thickened endometrium        Exam Ended: 12/12/23 09:44 Last Resulted: 12/18/23 11:01    Procedure Speculum inserted into the vagina, cervix visualized and was prepped with Betadine. A single-toothed tenaculum was placed on the anterior lip of the cervix to stabilize it.  The 3 mm pipelle was introduced into the endometrial cavity without difficulty to a depth of 8cm, suction initiated and a moderate amount of tissue was obtained and sent to pathology.  The instruments were removed from the patient's vagina.  Minimal bleeding from the cervix was noted.  The patient tolerated the procedure well.   Darice Hoit, CMA present for exam  Assessment/Plan: 1. Thickened endometrium (Primary) - Surgical pathology( [place]/ [facility]) - Endometrial biopsy; Future   Routine post-procedure instructions were given to the patient.   Will contact with results of biopsy. Has AEX appt Friday, will discuss results. Continue increased dose of progesterone .   Shlonda Dolloff, WHNP

## 2023-12-18 NOTE — Patient Instructions (Addendum)
 Welcome to [facility]!  Thank you for choosing us  for your Primary Care needs.   We offer in person and video appointments for your convenience. You may call our office to schedule appointments, or you may schedule appointments with me through MyChart.   The best way to get in contact with me is via MyChart message. This will get to me faster than a phone call, unless there is an emergency, then please call 911.  The lab is located downstairs in the Sports Medicine building, we also have xray available there.   Follow up with me in about 3 months for labs and medication management, sooner if needed.

## 2023-12-18 NOTE — Progress Notes (Signed)
 "  New Patient Visit  Subjective:     Patient ID: [name], female    DOB: Sep 18, 1965, 58 y.o.   MRN: [id number]  Chief Complaint  Patient presents with   Establish Care    HPI  Discussed the use of AI scribe software for clinical note transcription with the patient, who gave verbal consent to proceed.  History of Present Illness [name] is a 58 year old female who presents for evaluation of her weight loss journey and management of her A1c levels.  Weight management and medication use - Currently using compounded tirzepatide for three months, starting at 10 mg and increasing to 15 mg - Weight loss of 8 to 12 pounds since initiation of tirzepatide - Purchases tirzepatide online from [employer] and self-administers as a subcutaneous injection - Concerned about muscle loss; increases protein intake and performs weight training two to three times per week  Gastrointestinal symptoms - Experiences heartburn as a side effect of tirzepatide - Uses extra Pepto-Bismol for symptom relief  Glycemic control and abdominal adiposity - A1c levels have been increasing over the past few years, with the most recent value at 6.2 - Monitors A1c closely - Increase in abdominal adiposity, attributed to menopause  Abnormal uterine bleeding and hormonal management - Vaginal bleeding ongoing for two weeks - Under care of gynecologist; progesterone  increased to 200 mg - Endometrial biopsy performed - Uses Estroval patch for hormonal management  Hepatic dysfunction - History of elevated liver enzymes - Prior diagnosis of fatty liver  Peripheral edema - Takes Lasix  for ankle swelling, primarily on workdays     ROS Per HPI  Outpatient Encounter Medications as of 12/18/2023  Medication Sig   ALPRAZolam  (XANAX ) 0.25 MG tablet Take 1 tablet (0.25 mg total) by mouth 4 (four) times daily.   atorvastatin  (LIPITOR) 40 MG tablet Take 1 tablet (40 mg total) by mouth daily.    Azelaic Acid  15 % gel Apply to face once daily   Coenzyme Q10 (CO Q 10 PO) Take by mouth daily.   Dupilumab  (DUPIXENT ) 300 MG/2ML SOAJ 1 every 2 weeks Day 15 Inject 1 300 mg pen/syringe, continue every 2 weeks for MAINTENANCE   estradiol  (VIVELLE -DOT) 0.075 MG/24HR Place 1 patch onto the skin 2 (two) times a week. Needs appointment for further refills   famotidine  (PEPCID ) 40 MG tablet Take 1 tablet (40 mg total) by mouth 2 (two) times daily.   furosemide  (LASIX ) 20 MG tablet Take 1 tablet (20 mg total) by mouth daily.   losartan  (COZAAR ) 50 MG tablet Take 1 tablet by mouth 2 times daily   Multiple Vitamins-Minerals (MULTIVITAMIN PO) Take by mouth daily.   NONFORMULARY OR COMPOUNDED ITEM Compounded generic Zepbound 15 mg   ondansetron  (ZOFRAN ) 4 MG tablet Take 1-2 tablets (4-8 mg total) by mouth every 6 (six) hours.   ondansetron  (ZOFRAN -ODT) 8 MG disintegrating tablet Take 1 tablet (8 mg total) by mouth 2 (two) times daily as needed.   prochlorperazine  (COMPAZINE ) 10 MG tablet Take 1 tablet (10 mg total) by mouth 4 (four) times daily.   progesterone  (PROMETRIUM ) 200 MG capsule Take 1 capsule (200 mg total) by mouth daily.   Spacer/Aero-Holding Chambers (OPTICHAMBER DIAMOND ) MISC Use with inhaler   Tirzepatide-Weight Management 15 MG/0.5ML SOLN Inject 15 mg into the skin once a week.   tacrolimus  (PROTOPIC ) 0.1 % ointment Apply as directed to affected area twice a day (Patient not taking: Reported on 12/21/2023)   [DISCONTINUED] ALPRAZolam  (XANAX ) 0.5 MG  tablet Take 1 tablet (0.5 mg total) by mouth 4 (four) times daily. (Patient not taking: Reported on 12/18/2023)   [DISCONTINUED] atorvastatin  (LIPITOR) 40 MG tablet Take 1 tablet by mouth once daily (Patient not taking: Reported on 12/18/2023)   [DISCONTINUED] atorvastatin  (LIPITOR) 40 MG tablet Take 1 tablet (40 mg total) by mouth daily. (Patient not taking: Reported on 12/18/2023)   [DISCONTINUED] atorvastatin  (LIPITOR) 40 MG tablet Take 1  tablet (40 mg) by mouth daily (Patient not taking: Reported on 12/18/2023)   [DISCONTINUED] betamethasone  dipropionate (DIPROLENE ) 0.05 % ointment Apply as directed to affected area twice a day Taper use as able. (Patient not taking: Reported on 12/18/2023)   [DISCONTINUED] Dupilumab  (DUPIXENT ) 300 MG/2ML SOAJ 2 as directed Day 1 inject 2 300mg  syringes/pens for LOADING DOSE (Patient not taking: Reported on 12/18/2023)   [DISCONTINUED] famotidine  (PEPCID ) 40 MG tablet Take 1 tablet (40 mg total) by mouth daily. (Patient not taking: Reported on 12/18/2023)   [DISCONTINUED] famotidine  (PEPCID ) 40 MG tablet Take 1 tablet (40 mg total) by mouth daily. (Patient not taking: Reported on 12/18/2023)   [DISCONTINUED] furosemide  (LASIX ) 20 MG tablet Take 1 tablet (20 mg total) by mouth daily. (Patient not taking: Reported on 12/18/2023)   [DISCONTINUED] losartan  (COZAAR ) 50 MG tablet Take 1 tablet (50 mg total) by mouth 2 (two) times daily. (Patient not taking: Reported on 12/18/2023)   [DISCONTINUED] ondansetron  (ZOFRAN ) 4 MG tablet Take 1-2 tablets (4-8 mg total) by mouth every 6 (six) hours. (Patient not taking: Reported on 12/18/2023)   [DISCONTINUED] spironolactone  (ALDACTONE ) 25 MG tablet Take 1 tablet (25 mg total) by mouth daily. (Patient not taking: Reported on 12/18/2023)   [DISCONTINUED] spironolactone  (ALDACTONE ) 25 MG tablet Take 1 tablet (25 mg total) by mouth daily. (Patient not taking: Reported on 12/18/2023)   No facility-administered encounter medications on file as of 12/18/2023.    Past Medical History:  Diagnosis Date   Anemia    iron def anemia, iron infusion   Bell's palsy    Celiac disease    Fibroid    GERD (gastroesophageal reflux disease)    History of pneumonia    Hypertension    Iron deficiency anemia    Meniere's disease    Menorrhagia    Psoriasis    STD (sexually transmitted disease)    HSV1   Vertigo     Past Surgical History:  Procedure Laterality Date    APPENDECTOMY      58 years old    Family History  Problem Relation Age of Onset   Cancer Mother        lung   Cancer Brother        testicular   Heart attack Brother    Heart failure Father    Stroke Sister     Social History   Socioeconomic History   Marital status: Single    Spouse name: Not on file   Number of children: Not on file   Years of education: Not on file   Highest education level: Bachelor's degree (e.g., BA, AB, BS)  Occupational History   Not on file  Tobacco Use   Smoking status: Former    Current packs/day: 0.00    Types: Cigarettes    Quit date: 08/07/1998    Years since quitting: 25.4    Passive exposure: Past   Smokeless tobacco: Never  Substance and Sexual Activity   Alcohol use: Yes    Alcohol/week: 6.0 - 8.0 standard drinks of alcohol  Types: 6 - 8 Standard drinks or equivalent per week   Drug use: No   Sexual activity: Not Currently    Partners: Male    Birth control/protection: Post-menopausal    Comment: menarche 58yo, sexual debut 58yo  Other Topics Concern   Not on file  Social History Narrative   Not on file   Social Drivers of Health   Financial Resource Strain: Low Risk  (12/17/2023)   Overall Financial Resource Strain (CARDIA)    Difficulty of Paying Living Expenses: Not hard at all  Food Insecurity: No Food Insecurity (12/17/2023)   Hunger Vital Sign    Worried About Running Out of Food in the Last Year: Never true    Ran Out of Food in the Last Year: Never true  Transportation Needs: No Transportation Needs (12/17/2023)   PRAPARE - Administrator, Civil Service (Medical): No    Lack of Transportation (Non-Medical): No  Physical Activity: Insufficiently Active (12/17/2023)   Exercise Vital Sign    Days of Exercise per Week: 3 days    Minutes of Exercise per Session: 40 min  Stress: No Stress Concern Present (12/17/2023)   [employer] of Occupational Health - Occupational Stress Questionnaire    Feeling  of Stress: Only a little  Social Connections: Socially Isolated (12/17/2023)   Social Connection and Isolation Panel    Frequency of Communication with Friends and Family: Three times a week    Frequency of Social Gatherings with Friends and Family: Once a week    Attends Religious Services: Never    Database Administrator or Organizations: No    Attends Engineer, Structural: Not on file    Marital Status: Never married  Catering Manager Violence: Not on file       Objective:    BP (!) 128/90 (BP Location: Left Arm, Patient Position: Sitting)   Pulse 69   Temp 97.6 F (36.4 C) (Temporal)   Ht 5' 3.5 (1.613 m)   Wt 190 lb 12.8 oz (86.5 kg)   LMP 05/31/2011   SpO2 98%   BMI 33.27 kg/m    Physical Exam Vitals and nursing note reviewed.  Constitutional:      General: She is not in acute distress.    Appearance: Normal appearance. She is normal weight.  HENT:     Head: Normocephalic and atraumatic.     Right Ear: External ear normal.     Left Ear: External ear normal.     Nose: Nose normal.     Mouth/Throat:     Mouth: Mucous membranes are moist.     Pharynx: Oropharynx is clear.  Eyes:     Extraocular Movements: Extraocular movements intact.     Pupils: Pupils are equal, round, and reactive to light.  Cardiovascular:     Rate and Rhythm: Normal rate and regular rhythm.     Pulses: Normal pulses.     Heart sounds: Normal heart sounds.  Pulmonary:     Effort: Pulmonary effort is normal. No respiratory distress.     Breath sounds: Normal breath sounds. No wheezing, rhonchi or rales.  Musculoskeletal:        General: Normal range of motion.     Cervical back: Normal range of motion.     Right lower leg: No edema.     Left lower leg: No edema.  Lymphadenopathy:     Cervical: No cervical adenopathy.  Neurological:     General: No focal deficit present.  Mental Status: She is alert and oriented to person, place, and time.  Psychiatric:        Mood and  Affect: Mood normal.        Thought Content: Thought content normal.     Results for orders placed or performed in visit on 12/18/23  Surgical pathology( [place]/ [facility])  Result Value Ref Range   SURGICAL PATHOLOGY      SURGICAL PATHOLOGY CASE: [id number] PATIENT: Skylor Bazzle Surgical Pathology Report     Clinical History: thickened endometrium, on HRT (cm)     FINAL MICROSCOPIC DIAGNOSIS:  A. ENDOMETRIUM, BIOPSY: - Fragment suggestive of benign endometrial polyp. - Background inactive endometrium.   GROSS DESCRIPTION:  Received in formalin is blood tinged mucus that is entirely submitted in one block.  Volume: 1.2 x 1.0 x 0.2 cm (1 B) (KW, 12/19/2023)  Final Diagnosis performed by Wally Highland, MD.   Electronically signed 12/20/2023 Technical and / or Professional components performed at [facility]. [facility], [address], [place], [place] [address].  Immunohistochemistry Technical component (if applicable) was performed at [facility]. [address], [address], [place], [place] [address].   IMMUNOHISTOCHEMISTRY DISCLAIMER (if applicable): Some of these immunohistochemical stains may have been developed and the performance characte ristics determine by [facility]. Some may not have been cleared or approved by the U.S. Food and Drug Administration. The FDA has determined that such clearance or approval is not necessary. This test is used for clinical purposes. It should not be regarded as investigational or for research. This laboratory is certified under the Clinical Laboratory Improvement Amendments of 1988 (CLIA-88) as qualified to perform high complexity clinical laboratory testing.  The controls stained appropriately.   IHC stains are performed on formalin fixed, paraffin embedded tissue using a 3,3diaminobenzidine (DAB) chromogen and Leica Bond Autostainer System. The staining intensity of the  nucleus is score manually and is reported as the percentage of tumor cell nuclei demonstrating specific nuclear staining. The specimens are fixed in 10% Neutral Formalin for at least 6 hours and up to 72hrs. These tests are validated on decalcified tissue. Results should be interpreted with  caution given the possibility of false negative results on decalcified specimens. Antibody Clones are as follows ER-clone [id number], PR-clone 16, Ki67- clone MM1. Some of these immunohistochemical stains may have been developed and the performance characteristics determined by [facility].         Assessment & Plan:   Assessment and Plan Assessment & Plan Obesity, Prediabetes On Tarzepatide for three months with 8-12 lbs weight loss. Heartburn noted, no significant appetite suppression. A1c increasing, possibly due to menopause-related weight gain. GLP-1 therapy may reduce A1c and improve fatty liver. - Continue Tarzepatide with current titration schedule. - Monitor weight loss and side effects. - Ordered A1c, CBC, CMP, lipids, thyroid , and B12 labs. - Follow up in three months to assess liver and kidney function.  Fatty liver disease Previous ultrasound confirmed fatty liver. GLP-1 therapy may improve liver condition. - Monitor liver function tests with GLP-1 therapy.  Lower extremity edema Experiences edema. - Continue Lasix  as needed for edema management.  Celiac disease -Continue to avoid gluten -Chronic, stable  Iron deficiency anemia - CBC today  Encounter screening for cardiovascular disorders -Lipid panel today  Vitamin D  deficiency -Vitamin D  levels today     Orders Placed This Encounter  Procedures   CBC with Differential/Platelet    Standing Status:   Future    Expiration  Date:   12/17/2024    Release to patient:   Immediate [1]   Comprehensive metabolic panel with GFR    Standing Status:   Future    Expiration Date:   12/17/2024    Release to patient:    Immediate [1]   Hemoglobin A1c    Standing Status:   Future    Expiration Date:   12/17/2024   VITAMIN D  25 Hydroxy (Vit-D Deficiency, Fractures)    Standing Status:   Future    Expiration Date:   12/17/2024   Vitamin B12    Standing Status:   Future    Expiration Date:   12/17/2024   TSH    Standing Status:   Future    Expiration Date:   12/17/2024   Lipid panel    Standing Status:   Future    Expiration Date:   12/17/2024     No orders of the defined types were placed in this encounter.   Return in about 3 months (around 03/19/2024) for Meds OV.  Corean LITTIE Ku, FNP   "

## 2023-12-20 LAB — SURGICAL PATHOLOGY

## 2023-12-21 ENCOUNTER — Ambulatory Visit (INDEPENDENT_AMBULATORY_CARE_PROVIDER_SITE_OTHER): Admitting: Radiology

## 2023-12-21 ENCOUNTER — Ambulatory Visit: Payer: Self-pay | Admitting: Radiology

## 2023-12-21 ENCOUNTER — Encounter: Payer: Self-pay | Admitting: Radiology

## 2023-12-21 VITALS — BP 126/82 | Ht 63.5 in | Wt 187.0 lb

## 2023-12-21 DIAGNOSIS — D23111 Other benign neoplasm of skin of right upper eyelid, including canthus: Secondary | ICD-10-CM | POA: Diagnosis not present

## 2023-12-21 DIAGNOSIS — Z01419 Encounter for gynecological examination (general) (routine) without abnormal findings: Secondary | ICD-10-CM

## 2023-12-21 DIAGNOSIS — Z1331 Encounter for screening for depression: Secondary | ICD-10-CM | POA: Diagnosis not present

## 2023-12-21 DIAGNOSIS — N950 Postmenopausal bleeding: Secondary | ICD-10-CM

## 2023-12-21 DIAGNOSIS — Z79890 Hormone replacement therapy (postmenopausal): Secondary | ICD-10-CM

## 2023-12-21 NOTE — Progress Notes (Signed)
   [name] [birth year] [id number]   History: Postmenopausal 58 y.o. presents for annual exam. Bleeding on HRT, EMB showed a benign polyp.   Gynecologic History Postmenopausal Last Pap: 11/24. Results were: normal HPV neg Last mammogram: 01/15/23. Results were: normal Last colonoscopy: 2018   Obstetric History OB History  Gravida Para Term Preterm AB Living  0 0 0 0 0 0  SAB IAB Ectopic Multiple Live Births  0 0 0 0        12/21/2023    8:29 AM 12/18/2023   11:33 AM  Depression screen PHQ 2/9  Decreased Interest 0 0  Down, Depressed, Hopeless 0 0  PHQ - 2 Score 0 0     The following portions of the patient's history were reviewed and updated as appropriate: allergies, current medications, past family history, past medical history, past social history, past surgical history, and problem list.  Review of Systems Pertinent items noted in HPI and remainder of comprehensive ROS otherwise negative.  Past medical history, past surgical history, family history and social history were all reviewed and documented in the EPIC chart.  Exam:  Vitals:   12/21/23 0828  BP: 126/82  Weight: 187 lb (84.8 kg)  Height: 5' 3.5 (1.613 m)   Body mass index is 32.61 kg/m.  General appearance:  Normal Thyroid :  Symmetrical, normal in size, without palpable masses or nodularity. Respiratory  Auscultation:  Clear without wheezing or rhonchi Cardiovascular  Auscultation:  Regular rate, without rubs, murmurs or gallops  Edema/varicosities:  Not grossly evident Abdominal  Soft,nontender, without masses, guarding or rebound.  Liver/spleen:  No organomegaly noted  Hernia:  None appreciated  Skin  Inspection:  Grossly normal Breasts: Examined lying and sitting.   Right: Without masses, retractions, nipple discharge or axillary adenopathy.   Left: Without masses, retractions, nipple discharge or axillary adenopathy. Genitourinary   Inguinal/mons:  Normal without inguinal  adenopathy  External genitalia:  Normal appearing vulva with no masses, tenderness, or lesions  BUS/Urethra/Skene's glands:  Normal  Vagina:  Normal appearing with normal color and discharge, no lesions. + bleeding. Atrophy: mild   Cervix:  Normal appearing without discharge or lesions  Uterus:  Normal in size, shape and contour.  Midline and mobile, nontender  Adnexa/parametria:     Rt: Normal in size, without masses or tenderness.   Lt: Normal in size, without masses or tenderness.  Anus and perineum: Normal    [name], CMA present for exam  Assessment/Plan:   1. Well woman exam with routine gynecological exam (Primary) Pap 2027  2. PMB (postmenopausal bleeding) Stop estrogen patch, continue progesterone  200mg  nightly May consider restarting low dose patch when bleeding stops  3. Depression screen   Return in 1 year for annual or sooner prn.  [name] B WHNP-BC, 8:52 AM 12/21/2023

## 2023-12-31 ENCOUNTER — Other Ambulatory Visit (HOSPITAL_COMMUNITY): Payer: Self-pay

## 2024-01-07 ENCOUNTER — Other Ambulatory Visit: Payer: Self-pay | Admitting: Pharmacy Technician

## 2024-01-07 ENCOUNTER — Other Ambulatory Visit: Payer: Self-pay

## 2024-01-07 NOTE — Progress Notes (Signed)
 Specialty Pharmacy Refill Coordination Note  [name] is a 58 y.o. female contacted today regarding refills of specialty medication(s)  Dupilumab  (Dupixent )   Patient requested (Patient-Rptd) Pickup at [facility] at [facility] date: (Patient-Rptd) 01/14/24   Medication will be filled on: 01/11/2024

## 2024-01-07 NOTE — Telephone Encounter (Signed)
 Per review of AEX 12/21/23,  Stop estrogen patch, continue progesterone  200mg  nightly May consider restarting low dose patch when bleeding stops

## 2024-01-08 ENCOUNTER — Other Ambulatory Visit: Payer: Self-pay

## 2024-01-08 ENCOUNTER — Other Ambulatory Visit (HOSPITAL_COMMUNITY): Payer: Self-pay

## 2024-01-08 MED ORDER — ESTRADIOL 0.0375 MG/24HR TD PTTW
1.0000 | MEDICATED_PATCH | TRANSDERMAL | 0 refills | Status: AC
  Filled 2024-01-08: qty 24, 84d supply, fill #0

## 2024-01-08 NOTE — Telephone Encounter (Signed)
 Would she like a new prescription for lower dose patch, so she does not have to cut in half?

## 2024-01-08 NOTE — Telephone Encounter (Signed)
 Routing to covering provider.

## 2024-01-08 NOTE — Telephone Encounter (Signed)
 Signed. Thanks.

## 2024-01-08 NOTE — Telephone Encounter (Signed)
 Rx pended for estradiol  0.0375 mg patch twice weekly. #24/3RF  Last AEX 12/21/23

## 2024-01-08 NOTE — Progress Notes (Signed)
 Clinical Intervention Note  Clinical Intervention Notes: Patient reported starting compounded tirzepitide. No DDIs identified with Dupixent .   Clinical Intervention Outcomes: Prevention of an adverse drug event   Advertising Account Planner

## 2024-01-09 ENCOUNTER — Other Ambulatory Visit (HOSPITAL_COMMUNITY): Payer: Self-pay

## 2024-01-10 ENCOUNTER — Other Ambulatory Visit (HOSPITAL_COMMUNITY): Payer: Self-pay

## 2024-01-10 ENCOUNTER — Other Ambulatory Visit: Payer: Self-pay

## 2024-01-11 ENCOUNTER — Other Ambulatory Visit (HOSPITAL_COMMUNITY): Payer: Self-pay

## 2024-01-14 ENCOUNTER — Other Ambulatory Visit (HOSPITAL_COMMUNITY): Payer: Self-pay

## 2024-01-14 ENCOUNTER — Other Ambulatory Visit: Payer: Self-pay

## 2024-01-30 ENCOUNTER — Telehealth: Payer: Self-pay | Admitting: Urgent Care

## 2024-01-30 MED ORDER — AMOXICILLIN 875 MG PO TABS
875.0000 mg | ORAL_TABLET | Freq: Two times a day (BID) | ORAL | 0 refills | Status: AC
  Filled 2024-01-30: qty 20, 10d supply, fill #0

## 2024-01-30 MED ORDER — PREDNISONE 20 MG PO TABS
ORAL_TABLET | ORAL | 0 refills | Status: DC
  Filled 2024-01-30: qty 10, 5d supply, fill #0

## 2024-01-30 NOTE — Telephone Encounter (Signed)
 Patient having 10 days of sinus congestion, drainage worse in the past 3 days.  Has a history of rhinitis, sinus infections.  Recommended prednisone  and Augmentin .

## 2024-02-01 ENCOUNTER — Other Ambulatory Visit (HOSPITAL_COMMUNITY): Payer: Self-pay

## 2024-02-05 ENCOUNTER — Other Ambulatory Visit: Payer: Self-pay

## 2024-02-06 ENCOUNTER — Other Ambulatory Visit (HOSPITAL_COMMUNITY): Payer: Self-pay

## 2024-02-06 ENCOUNTER — Other Ambulatory Visit: Payer: Self-pay

## 2024-02-06 NOTE — Progress Notes (Signed)
 Specialty Pharmacy Refill Coordination Note  [name] is a 58 y.o. female contacted today regarding refills of specialty medication(s) Dupilumab  (Dupixent )   Patient requested [name] at [facility] at [place] date: 02/12/24   Medication will be filled on: 02/11/24  Medication is pending a refill request, sent patient mychart to notify.

## 2024-02-12 ENCOUNTER — Other Ambulatory Visit

## 2024-02-12 DIAGNOSIS — K760 Fatty (change of) liver, not elsewhere classified: Secondary | ICD-10-CM

## 2024-02-12 DIAGNOSIS — K900 Celiac disease: Secondary | ICD-10-CM | POA: Diagnosis not present

## 2024-02-12 DIAGNOSIS — D509 Iron deficiency anemia, unspecified: Secondary | ICD-10-CM

## 2024-02-12 DIAGNOSIS — E559 Vitamin D deficiency, unspecified: Secondary | ICD-10-CM | POA: Diagnosis not present

## 2024-02-12 DIAGNOSIS — R7303 Prediabetes: Secondary | ICD-10-CM

## 2024-02-12 DIAGNOSIS — Z136 Encounter for screening for cardiovascular disorders: Secondary | ICD-10-CM | POA: Diagnosis not present

## 2024-02-12 LAB — CBC WITH DIFFERENTIAL/PLATELET
Basophils Absolute: 0.1 K/uL (ref 0.0–0.1)
Basophils Relative: 0.7 % (ref 0.0–3.0)
Eosinophils Absolute: 0.1 K/uL (ref 0.0–0.7)
Eosinophils Relative: 1.5 % (ref 0.0–5.0)
HCT: 41.7 % (ref 36.0–46.0)
Hemoglobin: 14.2 g/dL (ref 12.0–15.0)
Lymphocytes Relative: 22.2 % (ref 12.0–46.0)
Lymphs Abs: 1.8 K/uL (ref 0.7–4.0)
MCHC: 34.1 g/dL (ref 30.0–36.0)
MCV: 87 fl (ref 78.0–100.0)
Monocytes Absolute: 0.8 K/uL (ref 0.1–1.0)
Monocytes Relative: 10.3 % (ref 3.0–12.0)
Neutro Abs: 5.3 K/uL (ref 1.4–7.7)
Neutrophils Relative %: 65.3 % (ref 43.0–77.0)
Platelets: 262 K/uL (ref 150.0–400.0)
RBC: 4.79 Mil/uL (ref 3.87–5.11)
RDW: 12.2 % (ref 11.5–15.5)
WBC: 8.1 K/uL (ref 4.0–10.5)

## 2024-02-12 LAB — LIPID PANEL
Cholesterol: 126 mg/dL (ref 28–200)
HDL: 33.3 mg/dL — ABNORMAL LOW
LDL Cholesterol: 65 mg/dL (ref 10–99)
NonHDL: 92.44
Total CHOL/HDL Ratio: 4
Triglycerides: 138 mg/dL (ref 10.0–149.0)
VLDL: 27.6 mg/dL (ref 0.0–40.0)

## 2024-02-12 LAB — COMPREHENSIVE METABOLIC PANEL WITH GFR
ALT: 27 U/L (ref 3–35)
AST: 26 U/L (ref 5–37)
Albumin: 4.1 g/dL (ref 3.5–5.2)
Alkaline Phosphatase: 88 U/L (ref 39–117)
BUN: 12 mg/dL (ref 6–23)
CO2: 26 meq/L (ref 19–32)
Calcium: 9.2 mg/dL (ref 8.4–10.5)
Chloride: 104 meq/L (ref 96–112)
Creatinine, Ser: 0.73 mg/dL (ref 0.40–1.20)
GFR: 89.74 mL/min
Glucose, Bld: 95 mg/dL (ref 70–99)
Potassium: 3 meq/L — ABNORMAL LOW (ref 3.5–5.1)
Sodium: 140 meq/L (ref 135–145)
Total Bilirubin: 0.9 mg/dL (ref 0.2–1.2)
Total Protein: 6.8 g/dL (ref 6.0–8.3)

## 2024-02-12 LAB — HEMOGLOBIN A1C: Hgb A1c MFr Bld: 5.7 % (ref 4.6–6.5)

## 2024-02-12 LAB — VITAMIN D 25 HYDROXY (VIT D DEFICIENCY, FRACTURES): VITD: 20.04 ng/mL — ABNORMAL LOW (ref 30.00–100.00)

## 2024-02-12 LAB — VITAMIN B12: Vitamin B-12: 330 pg/mL (ref 211–911)

## 2024-02-12 LAB — TSH: TSH: 1.84 u[IU]/mL (ref 0.35–5.50)

## 2024-02-13 ENCOUNTER — Ambulatory Visit: Payer: Self-pay | Admitting: Family Medicine

## 2024-02-14 ENCOUNTER — Ambulatory Visit: Attending: Family Medicine | Admitting: Pharmacist

## 2024-02-14 ENCOUNTER — Other Ambulatory Visit: Payer: Self-pay

## 2024-02-14 ENCOUNTER — Other Ambulatory Visit (HOSPITAL_COMMUNITY): Payer: Self-pay

## 2024-02-14 DIAGNOSIS — Z79899 Other long term (current) drug therapy: Secondary | ICD-10-CM

## 2024-02-14 MED ORDER — DUPIXENT 300 MG/2ML ~~LOC~~ SOAJ: SUBCUTANEOUS | 11 refills | Status: DC

## 2024-02-14 MED ORDER — DUPIXENT 300 MG/2ML ~~LOC~~ SOAJ
SUBCUTANEOUS | 11 refills | Status: AC
  Filled 2024-02-14 – 2024-02-21 (×3): qty 4, 28d supply, fill #0

## 2024-02-14 NOTE — Progress Notes (Signed)
 Manufacturer copay card is applying, but limited due to an incorrect copay maximizer flag on the manufacturer side.  Patient advised via MyChart to contact the manufacturer copay card progam directly, provide insurance details and request flag be removed so full benefits can be applied. Instructed patient to call pharmacy once issue resolved.  In the event the issue is resolved, but copay card still does not pay full amount patient will be responsible for copay since they have a HDHP.

## 2024-02-14 NOTE — Progress Notes (Signed)
" ° °  S: Patient presents for review of their specialty medication therapy.  Patient is taking Dupixent  for atopic dermatitis. Patient is managed by Dr. Bonney for this.   Adherence: confirmed  Efficacy: reports that the medication works well for her.   Dosing: 300 mg subcutaneously once every 14 days.  Dose adjustments: Renal: no dose adjustments (has not been studied) Hepatic: no dose adjustments (has not been studied)  Drug-drug interactions: none identified   Monitoring: S/sx of infection: none S/sx of hypersensitivity: none  S/sx of ocular effects: none  S/sx of eosinophilia/vasculitis: none  O:     Lab Results  Component Value Date   WBC 8.1 02/12/2024   HGB 14.2 02/12/2024   HCT 41.7 02/12/2024   MCV 87.0 02/12/2024   PLT 262.0 02/12/2024      Chemistry      Component Value Date/Time   NA 140 02/12/2024 1117   K 3.0 (L) 02/12/2024 1117   CL 104 02/12/2024 1117   CO2 26 02/12/2024 1117   BUN 12 02/12/2024 1117   CREATININE 0.73 02/12/2024 1117      Component Value Date/Time   CALCIUM  9.2 02/12/2024 1117   ALKPHOS 88 02/12/2024 1117   AST 26 02/12/2024 1117   ALT 27 02/12/2024 1117   BILITOT 0.9 02/12/2024 1117       A/P: 1. Medication review: Patient currently on Dupixent  for atopic dermatitis. Reviewed the medication with the patient, including the following: Dupixent  is a monoclonal antibody used for the treatment of asthma or atopic dermatitis. Patient educated on purpose, proper use and potential adverse effects of Dupixent . Possible adverse effects include increased risk of infection, ocular effects, vasculitis/eosinophilia, and hypersensitivity reactions. Administer as a SubQ injection and rotate sites. Allow the medication to reach room temp prior to administration (45 mins for 300 mg syringe or 30 min for 200 mg syringe). Do not shake. Discard any unused portion. No recommendations for any changes.  Herlene Fleeta Morris, PharmD, JAQUELINE, CPP Clinical  Pharmacist [facility] [phone]              "

## 2024-02-14 NOTE — Progress Notes (Signed)
 Benefits Investigation Started  Reason: New copay $1,750.00. Previous copay $0.  Routed to: Tiffany

## 2024-02-15 ENCOUNTER — Other Ambulatory Visit: Payer: Self-pay

## 2024-02-15 ENCOUNTER — Other Ambulatory Visit (HOSPITAL_COMMUNITY): Payer: Self-pay

## 2024-02-15 NOTE — Progress Notes (Signed)
 Patient contacted manufacturer to remove maximizer flag and was told she will receive a call back in 3 days. Patient advised she is responsible for copay since she has a HDHP, if she needs us  to fill now. Instructed her to contact pharmacy to request to proceed with refill.

## 2024-02-18 ENCOUNTER — Other Ambulatory Visit: Payer: Self-pay

## 2024-02-19 ENCOUNTER — Other Ambulatory Visit: Payer: Self-pay

## 2024-02-19 NOTE — Progress Notes (Signed)
 [employer] escalation team has requested patient to fax a copy of her summary of benefits.   Fax was sent today to [phone]

## 2024-02-20 ENCOUNTER — Other Ambulatory Visit: Payer: Self-pay

## 2024-02-21 ENCOUNTER — Other Ambulatory Visit (HOSPITAL_COMMUNITY): Payer: Self-pay

## 2024-02-21 ENCOUNTER — Other Ambulatory Visit: Payer: Self-pay

## 2024-02-25 ENCOUNTER — Other Ambulatory Visit: Payer: Self-pay

## 2024-02-28 ENCOUNTER — Other Ambulatory Visit: Payer: Self-pay

## 2024-02-29 ENCOUNTER — Other Ambulatory Visit (HOSPITAL_COMMUNITY): Payer: Self-pay

## 2024-03-06 ENCOUNTER — Telehealth: Admitting: Physician Assistant

## 2024-03-06 ENCOUNTER — Other Ambulatory Visit (HOSPITAL_COMMUNITY): Payer: Self-pay

## 2024-03-06 ENCOUNTER — Other Ambulatory Visit: Payer: Self-pay

## 2024-03-06 DIAGNOSIS — M109 Gout, unspecified: Secondary | ICD-10-CM | POA: Diagnosis not present

## 2024-03-06 MED ORDER — PREDNISONE 20 MG PO TABS
40.0000 mg | ORAL_TABLET | Freq: Every day | ORAL | 0 refills | Status: AC
  Filled 2024-03-06: qty 14, 7d supply, fill #0

## 2024-03-06 NOTE — Progress Notes (Signed)

## 2024-03-10 ENCOUNTER — Other Ambulatory Visit: Payer: Self-pay

## 2024-03-11 ENCOUNTER — Other Ambulatory Visit (HOSPITAL_COMMUNITY): Payer: Self-pay
